# Patient Record
Sex: Female | Born: 1960 | Race: White | Hispanic: No | Marital: Married | Smoking: Current every day smoker
Health system: Southern US, Community
[De-identification: ages and names within clinical notes are randomized; demographics above are authoritative.]

## PROBLEM LIST (undated history)

## (undated) DIAGNOSIS — K219 Gastro-esophageal reflux disease without esophagitis: Secondary | ICD-10-CM

## (undated) DIAGNOSIS — J45909 Unspecified asthma, uncomplicated: Secondary | ICD-10-CM

## (undated) DIAGNOSIS — I10 Essential (primary) hypertension: Secondary | ICD-10-CM

## (undated) DIAGNOSIS — D509 Iron deficiency anemia, unspecified: Secondary | ICD-10-CM

## (undated) DIAGNOSIS — F32A Depression, unspecified: Secondary | ICD-10-CM

## (undated) DIAGNOSIS — F329 Major depressive disorder, single episode, unspecified: Secondary | ICD-10-CM

## (undated) DIAGNOSIS — M1990 Unspecified osteoarthritis, unspecified site: Secondary | ICD-10-CM

## (undated) DIAGNOSIS — F909 Attention-deficit hyperactivity disorder, unspecified type: Secondary | ICD-10-CM

## (undated) DIAGNOSIS — J189 Pneumonia, unspecified organism: Secondary | ICD-10-CM

## (undated) HISTORY — DX: Iron deficiency anemia, unspecified: D50.9

## (undated) HISTORY — PX: NECK SURGERY: SHX720

## (undated) HISTORY — PX: BLADDER SUSPENSION: SHX72

## (undated) HISTORY — PX: BREAST ENHANCEMENT SURGERY: SHX7

---

## 1999-12-17 HISTORY — PX: BACK SURGERY: SHX140

## 2003-12-17 HISTORY — PX: SHOULDER SURGERY: SHX246

## 2004-05-16 ENCOUNTER — Encounter: Payer: Self-pay | Admitting: Family Medicine

## 2004-05-16 LAB — CONVERTED CEMR LAB

## 2005-09-19 ENCOUNTER — Ambulatory Visit: Payer: Self-pay | Admitting: Family Medicine

## 2006-09-23 DIAGNOSIS — N393 Stress incontinence (female) (male): Secondary | ICD-10-CM | POA: Insufficient documentation

## 2006-09-23 DIAGNOSIS — G4700 Insomnia, unspecified: Secondary | ICD-10-CM | POA: Insufficient documentation

## 2006-10-31 ENCOUNTER — Encounter: Payer: Self-pay | Admitting: Family Medicine

## 2008-02-18 ENCOUNTER — Encounter: Payer: Self-pay | Admitting: Family Medicine

## 2008-02-25 ENCOUNTER — Encounter: Payer: Self-pay | Admitting: Family Medicine

## 2008-08-10 ENCOUNTER — Encounter: Payer: Self-pay | Admitting: Family Medicine

## 2009-03-08 DIAGNOSIS — M542 Cervicalgia: Secondary | ICD-10-CM | POA: Insufficient documentation

## 2009-03-12 DIAGNOSIS — E559 Vitamin D deficiency, unspecified: Secondary | ICD-10-CM | POA: Insufficient documentation

## 2010-03-19 DIAGNOSIS — M25649 Stiffness of unspecified hand, not elsewhere classified: Secondary | ICD-10-CM | POA: Insufficient documentation

## 2011-12-17 HISTORY — PX: HAND SURGERY: SHX662

## 2012-08-11 DIAGNOSIS — D649 Anemia, unspecified: Secondary | ICD-10-CM | POA: Insufficient documentation

## 2012-09-10 ENCOUNTER — Ambulatory Visit (INDEPENDENT_AMBULATORY_CARE_PROVIDER_SITE_OTHER): Payer: 59 | Admitting: Family Medicine

## 2012-09-10 ENCOUNTER — Encounter: Payer: Self-pay | Admitting: Family Medicine

## 2012-09-10 VITALS — BP 132/73 | HR 87 | Wt 147.0 lb

## 2012-09-10 DIAGNOSIS — E785 Hyperlipidemia, unspecified: Secondary | ICD-10-CM | POA: Insufficient documentation

## 2012-09-10 DIAGNOSIS — F17200 Nicotine dependence, unspecified, uncomplicated: Secondary | ICD-10-CM

## 2012-09-10 DIAGNOSIS — Z23 Encounter for immunization: Secondary | ICD-10-CM

## 2012-09-10 DIAGNOSIS — F329 Major depressive disorder, single episode, unspecified: Secondary | ICD-10-CM

## 2012-09-10 DIAGNOSIS — F32A Depression, unspecified: Secondary | ICD-10-CM

## 2012-09-10 DIAGNOSIS — Z720 Tobacco use: Secondary | ICD-10-CM

## 2012-09-10 DIAGNOSIS — F909 Attention-deficit hyperactivity disorder, unspecified type: Secondary | ICD-10-CM | POA: Insufficient documentation

## 2012-09-10 MED ORDER — FLUOXETINE HCL 40 MG PO CAPS: 40.0000 mg | ORAL_CAPSULE | Freq: Every day | ORAL | Status: DC

## 2012-09-10 MED ORDER — LOVASTATIN 40 MG PO TABS: 40.0000 mg | ORAL_TABLET | Freq: Every day | ORAL | Status: DC

## 2012-09-10 NOTE — Addendum Note (Signed)
Addended by: Judie Petit A on: 09/10/2012 04:35 PM   Modules accepted: Orders

## 2012-09-10 NOTE — Progress Notes (Signed)
Subjective:    Patient ID: Glenda Fernandez, female    DOB: [birth year], 51 y.o.   MRN: [id number]  HPI Here to estab care. She was previously seen at [facility] in [place] but her physician now is only working part time. SHe fractured her hand on the left as she slipped on wet stairs. Has surgery scheduled next week. It is cast today.   Depression - Has been on fluoxtetine for several years.  Happy with her current regimen. She wants to stay on the 40 mg. She does need refills today.  ADHD- Doing well on her vyvanse. She is out and having a hard time focusing at work.    Hyperlpidemia - Taking her statin and dong well.   She reports her mammogram is up-to-date. Her colonoscopy is overdue. She said she was planning on scheduling it before she fell and fractured her wrist. I encouraged her to go ahead and schedule this out.   Review of Systems  Constitutional: Negative for fever, diaphoresis and unexpected weight change.  HENT: Negative for hearing loss, rhinorrhea and tinnitus.   Eyes: Negative for visual disturbance.  Respiratory: Negative for cough and wheezing.   Cardiovascular: Negative for chest pain and palpitations.  Gastrointestinal: Negative for nausea, vomiting, diarrhea and blood in stool.  Genitourinary: Negative for vaginal bleeding, vaginal discharge and difficulty urinating.  Musculoskeletal: Positive for joint swelling and arthralgias. Negative for myalgias.  Skin: Negative for rash.  Neurological: Negative for headaches.  Hematological: Negative for adenopathy. Does not bruise/bleed easily.  Psychiatric/Behavioral: Positive for dysphoric mood. Negative for disturbed wake/sleep cycle. The patient is not nervous/anxious.     BP 132/73  Pulse 87  Wt 147 lb (66.679 kg)    No Known Allergies  No past medical history on file.  Past Surgical History  Procedure Date  . Breast enhancement surgery 1879  . Shoulder surgery 2005  . Neck surgery   . Back  surgery 2001    History   Social History  . Marital Status: Married    Spouse Name: Molly Maduro    Number of Children: 2  . 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 Smoking status: Current Every Day Smoker -- 1.0 packs/day  . Smokeless tobacco: Not on file  . Alcohol Use: Not on file  . Drug Use: Not on file  . Sexually Active: Not on file   Other Topics Concern  . Not on file   Social History Narrative   Some college.  3 caffeine per day.  Walks 3 times per week for exercise.     Family History  Problem Relation Age of Onset  . Coronary artery disease Father   . Lung cancer Father     mesothelioma  . Alcohol abuse      uncle  . Breast cancer Mother   . Kidney failure Mother   . Bipolar disorder Daughter   . Hyperlipidemia Mother   . Hyperlipidemia Father     Outpatient Encounter Prescriptions as of 09/10/2012  Medication Sig Dispense Refill  . FLUoxetine (PROZAC) 40 MG capsule Take 1 capsule (40 mg total) by mouth daily.  90 capsule  1  . lisdexamfetamine (VYVANSE) 70 MG capsule Take 70 mg by mouth every morning.      . lovastatin (MEVACOR) 40 MG tablet Take 1 tablet (40 mg total) by mouth at bedtime.  90 tablet  1  . DISCONTD: FLUoxetine (PROZAC) 40 MG capsule Take 40 mg by mouth daily.      Marland Kitchen DISCONTD: lovastatin (MEVACOR) 40 MG tablet Take 40 mg by mouth at bedtime.              Objective:   Physical Exam  Constitutional: She is oriented to person, place, and time. She appears well-developed and well-nourished.  HENT:  Head: Normocephalic and atraumatic.  Cardiovascular: Normal rate, regular rhythm and normal heart sounds.   Pulmonary/Chest: Effort normal and breath sounds normal.  Neurological: She is alert and oriented to person, place, and time.  Skin: Skin is warm and dry.  Psychiatric: She has a normal mood and affect. Her behavior is normal.          Assessment & Plan:   Depression - she's doing on her current regimen. We will have incentive her 90 day prescription to the pharmacy.  ADHD - she's doing very well on her Vyvanse. Adjusting to get records from her previous physician to confirm that she has this diagnosis is taking the medication because it is scheduled. Once I get these and then we can write for her medication. She's been out for several weeks. We will give her a coupon card today that she can use once we get the prescription for her.  Hyperlipidemia-I'll refill of her statin.  tob abuse - Discussed smoking cessation. Says know she need to quit.    Seh is not sure if pertussis if up to date.

## 2012-09-10 NOTE — Patient Instructions (Addendum)
Can check with your insurance company to see if they cover the shingles vaccine.  Can check to see if you had a Tdap or Td.

## 2012-09-11 ENCOUNTER — Ambulatory Visit: Payer: Self-pay | Admitting: Family Medicine

## 2012-09-11 ENCOUNTER — Telehealth: Payer: Self-pay | Admitting: Family Medicine

## 2012-09-11 ENCOUNTER — Other Ambulatory Visit: Payer: Self-pay | Admitting: Family Medicine

## 2012-09-11 ENCOUNTER — Encounter: Payer: Self-pay | Admitting: Family Medicine

## 2012-09-11 DIAGNOSIS — D509 Iron deficiency anemia, unspecified: Secondary | ICD-10-CM

## 2012-09-11 DIAGNOSIS — F329 Major depressive disorder, single episode, unspecified: Secondary | ICD-10-CM | POA: Insufficient documentation

## 2012-09-11 DIAGNOSIS — F32A Depression, unspecified: Secondary | ICD-10-CM | POA: Insufficient documentation

## 2012-09-11 HISTORY — DX: Iron deficiency anemia, unspecified: D50.9

## 2012-09-11 MED ORDER — LISDEXAMFETAMINE DIMESYLATE 70 MG PO CAPS: 70.0000 mg | ORAL_CAPSULE | ORAL | Status: DC

## 2012-09-11 NOTE — Telephone Encounter (Signed)
Pt aware.

## 2012-09-11 NOTE — Telephone Encounter (Signed)
Please call pt and let her know got her records and ok to pick up her rx today.

## 2012-09-14 ENCOUNTER — Encounter: Payer: Self-pay | Admitting: Family Medicine

## 2012-09-14 DIAGNOSIS — F1111 Opioid abuse, in remission: Secondary | ICD-10-CM | POA: Insufficient documentation

## 2012-10-12 ENCOUNTER — Other Ambulatory Visit: Payer: Self-pay | Admitting: *Deleted

## 2012-10-12 MED ORDER — LISDEXAMFETAMINE DIMESYLATE 70 MG PO CAPS: 70.0000 mg | ORAL_CAPSULE | ORAL | Status: DC

## 2012-11-06 ENCOUNTER — Encounter: Payer: Self-pay | Admitting: Sports Medicine

## 2012-11-06 ENCOUNTER — Ambulatory Visit (INDEPENDENT_AMBULATORY_CARE_PROVIDER_SITE_OTHER): Payer: 59 | Admitting: Sports Medicine

## 2012-11-06 VITALS — BP 129/76 | HR 103 | Ht 67.0 in | Wt 143.0 lb

## 2012-11-06 DIAGNOSIS — IMO0002 Reserved for concepts with insufficient information to code with codable children: Secondary | ICD-10-CM

## 2012-11-06 DIAGNOSIS — L02412 Cutaneous abscess of left axilla: Secondary | ICD-10-CM | POA: Insufficient documentation

## 2012-11-06 MED ORDER — DOXYCYCLINE HYCLATE 100 MG PO TABS: 100.0000 mg | ORAL_TABLET | Freq: Two times a day (BID) | ORAL | Status: AC

## 2012-11-06 MED ORDER — HYDROCODONE-ACETAMINOPHEN 5-325 MG PO TABS: 1.0000 | ORAL_TABLET | Freq: Four times a day (QID) | ORAL | Status: DC | PRN

## 2012-11-06 NOTE — Assessment & Plan Note (Signed)
Incision and drainage as above. She will move about 1 inch of the packing day after tomorrow, and remove the rest of day after that. Aleve 2 tabs twice a day. Hydrocodone for breakthrough pain. Doxycycline for 7 days. Return to clinic in one week to see how things are doing

## 2012-11-06 NOTE — Progress Notes (Addendum)
Subjective:    CC: Lump in left axilla  HPI: Lump: This is been present and growing for a couple weeks. It is reddish, painful, and warm. She's had an abscess before on her face that was drained, and grew out MRSA. Pain is localized, does not radiate, is moderate.  Past medical history, Surgical history, Family history, Social history, Allergies, and medications have been entered into the medical record, reviewed, and no changes needed.   Review of Systems: No fevers, chills, night sweats, weight loss, chest pain, or shortness of breath.   Objective:    General: Well Developed, well nourished, and in no acute distress.  Neuro: Alert and oriented x3, extra-ocular muscles intact.  HEENT: Normocephalic, atraumatic, pupils equal round reactive to light, neck supple, no masses, no lymphadenopathy, thyroid nonpalpable.  Skin: Warm and dry, no rashes. Cardiac: Regular rate and rhythm, no murmurs rubs or gallops.  Respiratory: Clear to auscultation bilaterally. Not using accessory muscles, speaking in full sentences. There is a 3 x 2 cm abscess in her left axilla. This is fluctuant, warm, and tender to palpation.  Procedure: Complicated incision and drainage of abscess.  Risks, benefits, and alternatives explained and consent obtained. Time out conducted. Surface cleaned with alcohol. 5cc lidocaine with epinephine infiltrated around abscess. Adequate anesthesia ensured. Area prepped and draped in a sterile fashion. #11 blade used to make a stab incision into abscess. Pus expressed with pressure. Curved hemostat used to explore 4 quadrants and loculations broken up. Further purulence expressed. Iodoform packing placed leaving a 1-inch tail. Hemostasis achieved. Pt stable. Aftercare and follow-up advised.  Impression and Recommendations:

## 2012-11-09 ENCOUNTER — Other Ambulatory Visit: Payer: Self-pay | Admitting: *Deleted

## 2012-11-09 MED ORDER — LISDEXAMFETAMINE DIMESYLATE 70 MG PO CAPS: 70.0000 mg | ORAL_CAPSULE | ORAL | Status: DC

## 2012-11-09 NOTE — Telephone Encounter (Signed)
Tried to call pt at work number left and got VM. Tried to call pt on mobile to let her know that she can pickup her Vyvanse rx tomorrow and it had another persons name.

## 2012-11-16 ENCOUNTER — Ambulatory Visit: Payer: 59 | Admitting: Sports Medicine

## 2012-11-16 DIAGNOSIS — Z0289 Encounter for other administrative examinations: Secondary | ICD-10-CM

## 2012-11-20 ENCOUNTER — Other Ambulatory Visit: Payer: Self-pay | Admitting: Orthopedic Surgery

## 2012-11-20 DIAGNOSIS — Z9889 Other specified postprocedural states: Secondary | ICD-10-CM

## 2012-11-20 DIAGNOSIS — Z8781 Personal history of (healed) traumatic fracture: Secondary | ICD-10-CM

## 2012-11-26 ENCOUNTER — Other Ambulatory Visit: Payer: Self-pay | Admitting: Sports Medicine

## 2012-11-26 ENCOUNTER — Ambulatory Visit (INDEPENDENT_AMBULATORY_CARE_PROVIDER_SITE_OTHER): Payer: 59 | Admitting: Sports Medicine

## 2012-11-26 ENCOUNTER — Encounter: Payer: Self-pay | Admitting: Sports Medicine

## 2012-11-26 VITALS — BP 144/78 | HR 75 | Wt 145.0 lb

## 2012-11-26 DIAGNOSIS — L02412 Cutaneous abscess of left axilla: Secondary | ICD-10-CM

## 2012-11-26 DIAGNOSIS — IMO0002 Reserved for concepts with insufficient information to code with codable children: Secondary | ICD-10-CM

## 2012-11-26 MED ORDER — SULFAMETHOXAZOLE-TRIMETHOPRIM 800-160 MG PO TABS: 1.0000 | ORAL_TABLET | Freq: Two times a day (BID) | ORAL | Status: AC

## 2012-11-26 MED ORDER — HYDROCODONE-ACETAMINOPHEN 5-325 MG PO TABS: 1.0000 | ORAL_TABLET | Freq: Four times a day (QID) | ORAL | Status: DC | PRN

## 2012-11-26 NOTE — Assessment & Plan Note (Addendum)
This is new, the other abscess is healed completely. Incision and drainage as above. Culture swab sent. Since doxycycline made her sick, I am going to use Septra double strength for 7 days. Hydrocodone for pain. She will perform daily dressing changes.

## 2012-11-26 NOTE — Progress Notes (Signed)
Subjective:    CC: Lump in left axilla  HPI: Lump: This is present in the left axilla, I did see her several weeks ago for another abscess, we drained that and it healed completely. Unfortunately the doxycycline that we gave her did make her a little bit sick. Now she has another abscess somewhat below, it's painful, severe, the pain is localized, no radiation. She does have a history of MRSA.  Past medical history, Surgical history, Family history, Social history, Allergies, and medications have been entered into the medical record, reviewed, and no changes needed.   Review of Systems: No fevers, chills, night sweats, weight loss, chest pain, or shortness of breath.   Objective:    General: Well Developed, well nourished, and in no acute distress.  Neuro: Alert and oriented x3, extra-ocular muscles intact.  HEENT: Normocephalic, atraumatic, pupils equal round reactive to light, neck supple, no masses, no lymphadenopathy, thyroid nonpalpable.  Skin: Warm and dry, no rashes. Cardiac: Regular rate and rhythm, no murmurs rubs or gallops.  Respiratory: Clear to auscultation bilaterally. Not using accessory muscles, speaking in full sentences. There is a 1 x 2 cm abscess in her left axilla. This is fluctuant, warm, and tender to palpation.  Procedure: Complicated incision and drainage of abscess.  Risks, benefits, and alternatives explained and consent obtained. Time out conducted. Surface cleaned with alcohol. 5cc lidocaine with epinephine infiltrated around abscess. Adequate anesthesia ensured. Area prepped and draped in a sterile fashion. 4 mm punch biopsy used to incise abscess. Pus expressed with pressure. Curved hemostat used to explore 4 quadrants and loculations broken up. Further purulence expressed. Hemostasis achieved. Pt stable. Aftercare and follow-up advised.  Impression and Recommendations:

## 2012-11-30 LAB — WOUND CULTURE
Gram Stain: NONE SEEN
Gram Stain: NONE SEEN

## 2012-12-02 LAB — ANAEROBIC CULTURE
Gram Stain: NONE SEEN
Gram Stain: NONE SEEN

## 2012-12-07 ENCOUNTER — Other Ambulatory Visit: Payer: Self-pay | Admitting: *Deleted

## 2012-12-07 ENCOUNTER — Other Ambulatory Visit: Payer: Self-pay | Admitting: Sports Medicine

## 2012-12-07 MED ORDER — LISDEXAMFETAMINE DIMESYLATE 70 MG PO CAPS: 70.0000 mg | ORAL_CAPSULE | ORAL | Status: DC

## 2012-12-07 NOTE — Telephone Encounter (Signed)
Prescription is in my box 

## 2012-12-07 NOTE — Telephone Encounter (Signed)
Pt.notified

## 2012-12-07 NOTE — Telephone Encounter (Signed)
Pt calls and request a refill on her Vyvanse and wants to pick up today

## 2012-12-15 ENCOUNTER — Ambulatory Visit: Payer: 59 | Admitting: Family Medicine

## 2012-12-29 ENCOUNTER — Ambulatory Visit (INDEPENDENT_AMBULATORY_CARE_PROVIDER_SITE_OTHER): Payer: 59 | Admitting: Family Medicine

## 2012-12-29 ENCOUNTER — Encounter: Payer: Self-pay | Admitting: Family Medicine

## 2012-12-29 VITALS — BP 140/77 | HR 96 | Resp 18 | Wt 149.0 lb

## 2012-12-29 DIAGNOSIS — F32A Depression, unspecified: Secondary | ICD-10-CM

## 2012-12-29 DIAGNOSIS — E785 Hyperlipidemia, unspecified: Secondary | ICD-10-CM

## 2012-12-29 DIAGNOSIS — F329 Major depressive disorder, single episode, unspecified: Secondary | ICD-10-CM

## 2012-12-29 DIAGNOSIS — F3289 Other specified depressive episodes: Secondary | ICD-10-CM

## 2012-12-29 DIAGNOSIS — F909 Attention-deficit hyperactivity disorder, unspecified type: Secondary | ICD-10-CM

## 2012-12-29 MED ORDER — LOVASTATIN 40 MG PO TABS: 40.0000 mg | ORAL_TABLET | Freq: Every day | ORAL | Status: DC

## 2012-12-29 MED ORDER — LISDEXAMFETAMINE DIMESYLATE 70 MG PO CAPS: 70.0000 mg | ORAL_CAPSULE | ORAL | Status: DC

## 2012-12-29 MED ORDER — FLUOXETINE HCL 40 MG PO CAPS: 40.0000 mg | ORAL_CAPSULE | Freq: Every day | ORAL | Status: DC

## 2012-12-29 NOTE — Progress Notes (Signed)
  Subjective:    Patient ID: Glenda Fernandez, female    DOB: [birth year], 52 y.o.   MRN: [id number]  HPI Hyperlipidemia-doing well overall. No chest pain or shortness of breath. No side effects from medication. She ran out of her medication a couple weeks ago. Which is why she is in today. She denies any myalgias on the medication.  Depression-has been-uncontrolled on fluoxetine. Has been out of her medication for couple weeks as well. Has been teaful and has felt really down.  Work has been really stressful and overwhelming.  Both of her kids moved back in with her, and her grandkids. This is been a people in her life as well. One daughter suffers from depression and her second daughter suffers from bipolar disorder. Unfortunately neither one of them has been able to hold down a job and they're both applying for Medicaid currently.  ADHD-doing well on her current regimen. No chest pain or short of breath. It's not causing any insomnia. Her weight is stable.  Review of Systems     Objective:   Physical Exam  Constitutional: She is oriented to person, place, and time. She appears well-developed and well-nourished.  HENT:  Head: Normocephalic and atraumatic.  Cardiovascular: Normal rate, regular rhythm and normal heart sounds.   Pulmonary/Chest: Effort normal and breath sounds normal.  Neurological: She is alert and oriented to person, place, and time.  Skin: Skin is warm and dry.  Psychiatric: She has a normal mood and affect. Her behavior is normal.          Assessment & Plan:  Hyperlipidemia-Due for fasting labwork.  RF sent ot to the pharmacy  Depression-PHQ 9 score of 21. Severe and uncontrolled.Will restart her fluoxetine and then recheck in 1 month.  If she's not well controlled that point then consider increasing her dose to 60 mg or possibly adding a mood stabilizer or maybe Wellbutrin to her regimen. Unfortunately she's going through a lot of personal issues right now. Counseling  would also be helpful as well.  ADHD-well controlled. Continue current regimen. I gave her a prescription today but she will have to wait to have it filled until she's actually due.

## 2013-01-26 ENCOUNTER — Ambulatory Visit: Payer: 59 | Admitting: Family Medicine

## 2013-01-26 DIAGNOSIS — Z0289 Encounter for other administrative examinations: Secondary | ICD-10-CM

## 2013-02-04 ENCOUNTER — Telehealth: Payer: Self-pay | Admitting: *Deleted

## 2013-02-04 NOTE — Telephone Encounter (Signed)
Pt calls and states she miised her appt with you last week. Lost her job and has no insurance. Request a refill on her Vyvanse

## 2013-02-05 MED ORDER — LISDEXAMFETAMINE DIMESYLATE 70 MG PO CAPS: 70.0000 mg | ORAL_CAPSULE | ORAL | Status: DC

## 2013-02-05 NOTE — Telephone Encounter (Signed)
OK to fill

## 2013-03-03 ENCOUNTER — Other Ambulatory Visit: Payer: Self-pay | Admitting: *Deleted

## 2013-03-03 MED ORDER — LISDEXAMFETAMINE DIMESYLATE 70 MG PO CAPS: 70.0000 mg | ORAL_CAPSULE | ORAL | Status: DC

## 2013-03-03 NOTE — Telephone Encounter (Signed)
Will come p/u by end of this week.Glenda Fernandez

## 2013-04-05 ENCOUNTER — Encounter: Payer: Self-pay | Admitting: *Deleted

## 2013-04-05 ENCOUNTER — Other Ambulatory Visit: Payer: Self-pay | Admitting: *Deleted

## 2013-04-05 MED ORDER — LISDEXAMFETAMINE DIMESYLATE 70 MG PO CAPS: 70.0000 mg | ORAL_CAPSULE | ORAL | Status: DC

## 2013-05-04 ENCOUNTER — Telehealth: Payer: Self-pay | Admitting: *Deleted

## 2013-05-04 MED ORDER — LISDEXAMFETAMINE DIMESYLATE 70 MG PO CAPS: 70.0000 mg | ORAL_CAPSULE | ORAL | Status: DC

## 2013-05-04 NOTE — Telephone Encounter (Signed)
Request a refill on Vyvanse

## 2013-05-04 NOTE — Telephone Encounter (Signed)
Rx in my outbox

## 2013-05-04 NOTE — Telephone Encounter (Signed)
Pt notified. Kimberly Gordon, LPN  

## 2013-06-03 ENCOUNTER — Other Ambulatory Visit: Payer: Self-pay | Admitting: *Deleted

## 2013-06-03 MED ORDER — LISDEXAMFETAMINE DIMESYLATE 70 MG PO CAPS: 70.0000 mg | ORAL_CAPSULE | ORAL | Status: DC

## 2013-06-16 ENCOUNTER — Ambulatory Visit (INDEPENDENT_AMBULATORY_CARE_PROVIDER_SITE_OTHER): Payer: 59 | Admitting: Family Medicine

## 2013-06-16 ENCOUNTER — Encounter: Payer: Self-pay | Admitting: Family Medicine

## 2013-06-16 VITALS — BP 126/71 | HR 67 | Ht 67.6 in | Wt 146.0 lb

## 2013-06-16 DIAGNOSIS — F32A Depression, unspecified: Secondary | ICD-10-CM

## 2013-06-16 DIAGNOSIS — F329 Major depressive disorder, single episode, unspecified: Secondary | ICD-10-CM

## 2013-06-16 DIAGNOSIS — F909 Attention-deficit hyperactivity disorder, unspecified type: Secondary | ICD-10-CM

## 2013-06-16 DIAGNOSIS — Z1231 Encounter for screening mammogram for malignant neoplasm of breast: Secondary | ICD-10-CM

## 2013-06-16 DIAGNOSIS — Z0000 Encounter for general adult medical examination without abnormal findings: Secondary | ICD-10-CM

## 2013-06-16 MED ORDER — FLUOXETINE HCL 40 MG PO CAPS: 40.0000 mg | ORAL_CAPSULE | Freq: Every day | ORAL | Status: DC

## 2013-06-16 MED ORDER — LOVASTATIN 40 MG PO TABS: 40.0000 mg | ORAL_TABLET | Freq: Every day | ORAL | Status: DC

## 2013-06-16 MED ORDER — LISDEXAMFETAMINE DIMESYLATE 70 MG PO CAPS: 70.0000 mg | ORAL_CAPSULE | ORAL | Status: DC

## 2013-06-16 MED ORDER — FLUOXETINE HCL (PMDD) 20 MG PO CAPS: ORAL_CAPSULE | ORAL | Status: DC

## 2013-06-16 NOTE — Progress Notes (Signed)
  Subjective:    Patient ID: Glenda Fernandez, female    DOB: 06-16-1961, 52 y.o.   MRN: [id number]  HPI Depression - Says she wants to restart her fluxoetine. She has been off it for some time. She is really stressed. Her kids are still living with her and this is really stressful. No suicidal thoughts.  She's having a lot of difficulty with sleep and feels very poorly about herself. She's having trouble concentrating on things. She still feels down depressed more than half the days. She rates the difficulty of her emotions as very difficult.  ADHD-no chest pain, shortness of breath, palpitations on the medication. She reports that she's not skipping meals. She reports that it is not keeping her awake at night.   Review of Systems     Objective:   Physical Exam  Constitutional: She is oriented to person, place, and time. She appears well-developed and well-nourished.  HENT:  Head: Normocephalic and atraumatic.  Cardiovascular: Normal rate, regular rhythm and normal heart sounds.   Pulmonary/Chest: Effort normal and breath sounds normal.  Neurological: She is alert and oriented to person, place, and time.  Skin: Skin is warm and dry.  Psychiatric: She has a normal mood and affect. Her behavior is normal.          Assessment & Plan:  Depression - Uncontrolled.  Discussed restarting her fluoxetine. I really do think that she needs it. PHQ 9 score of 19 today. Followup in one month to make sure that she's doing well. She also schedule a complete physical at that time.  ADHD-well controlled on current regimen. She is asymptomatic on the medication and is not having any side effects. Doing well followup in 6 months.  Due for screening mammogram.

## 2013-07-14 ENCOUNTER — Encounter: Payer: Self-pay | Admitting: Family Medicine

## 2013-07-14 ENCOUNTER — Ambulatory Visit (INDEPENDENT_AMBULATORY_CARE_PROVIDER_SITE_OTHER): Payer: BC Managed Care – PPO | Admitting: Family Medicine

## 2013-07-14 ENCOUNTER — Other Ambulatory Visit (HOSPITAL_COMMUNITY)
Admission: RE | Admit: 2013-07-14 | Discharge: 2013-07-14 | Disposition: A | Discharge status: Home / Self Care | Payer: BC Managed Care – PPO | Source: Ambulatory Visit | Attending: Family Medicine | Admitting: Family Medicine

## 2013-07-14 VITALS — BP 140/79 | HR 91 | Ht 67.0 in | Wt 140.0 lb

## 2013-07-14 DIAGNOSIS — M25569 Pain in unspecified knee: Secondary | ICD-10-CM

## 2013-07-14 DIAGNOSIS — M25562 Pain in left knee: Secondary | ICD-10-CM

## 2013-07-14 DIAGNOSIS — Z1211 Encounter for screening for malignant neoplasm of colon: Secondary | ICD-10-CM

## 2013-07-14 DIAGNOSIS — Z0000 Encounter for general adult medical examination without abnormal findings: Secondary | ICD-10-CM

## 2013-07-14 DIAGNOSIS — Z1151 Encounter for screening for human papillomavirus (HPV): Secondary | ICD-10-CM | POA: Insufficient documentation

## 2013-07-14 DIAGNOSIS — Z01419 Encounter for gynecological examination (general) (routine) without abnormal findings: Secondary | ICD-10-CM | POA: Insufficient documentation

## 2013-07-14 DIAGNOSIS — N898 Other specified noninflammatory disorders of vagina: Secondary | ICD-10-CM

## 2013-07-14 DIAGNOSIS — Z124 Encounter for screening for malignant neoplasm of cervix: Secondary | ICD-10-CM

## 2013-07-14 LAB — COMPLETE METABOLIC PANEL WITH GFR
ALT: 10 U/L (ref 0–35)
Alkaline Phosphatase: 84 U/L (ref 39–117)
CO2: 28 mEq/L (ref 19–32)
Creat: 0.68 mg/dL (ref 0.50–1.10)
GFR, Est African American: >89 mL/min
GFR, Est Non African American: >89 mL/min
Glucose, Bld: 94 mg/dL (ref 70–99)
Sodium: 141 mEq/L (ref 135–145)
Total Bilirubin: 0.4 mg/dL (ref 0.3–1.2)
Total Protein: 7.3 g/dL (ref 6.0–8.3)

## 2013-07-14 LAB — LIPID PANEL
HDL: 50 mg/dL (ref >39)
Total CHOL/HDL Ratio: 3.6 Ratio
Triglycerides: 85 mg/dL (ref <150)

## 2013-07-14 MED ORDER — ALPRAZOLAM 0.25 MG PO TABS: 0.2500 mg | ORAL_TABLET | Freq: Every day | ORAL | Status: DC | PRN

## 2013-07-14 MED ORDER — LISDEXAMFETAMINE DIMESYLATE 70 MG PO CAPS: 70.0000 mg | ORAL_CAPSULE | ORAL | Status: DC

## 2013-07-14 MED ORDER — FLUOXETINE HCL 40 MG PO CAPS: 40.0000 mg | ORAL_CAPSULE | Freq: Every day | ORAL | Status: DC

## 2013-07-14 NOTE — Progress Notes (Signed)
Subjective:     Glenda Fernandez is a 52 y.o. female and is here for a comprehensive physical exam. The patient reports problems - Pain in left knee.Grandson tried to pick her up and they both fell and she hit her knee on the hardwood florr.  Painful to put pressure on it, not painful to walk. thinks did swelling initially.   History   Social History  . Marital Status: Married    Spouse Name: Molly Maduro    Number of Children: 2  . Years of Education: N/A   Occupational History  . [employer] Tech     [employer] [place]   Social History Main Topics  . Smoking status: Current Every Day Smoker -- 1.00 packs/day  . Smokeless tobacco: Not on file  . Alcohol Use: Not on file  . Drug Use: Not on file  . Sexually Active: Not on file   Other Topics Concern  . Not on file   Social History Narrative   Some college.  3 caffeine per day.  Walks 3 times per week for exercise.    Health Maintenance  Topic Date Due  . Mammogram  05/01/2013  . Colonoscopy  09/11/2013  . Influenza Vaccine  08/16/2013  . Pap Smear  03/12/2015  . Tetanus/tdap  12/16/2018    The following portions of the patient's history were reviewed and updated as appropriate: allergies, current medications, past family history, past medical history, past social history, past surgical history and problem list.  Review of Systems A comprehensive review of systems was negative.   Objective:    BP 140/79  Pulse 91  Ht 5\' 7"  (1.702 m)  Wt 140 lb (63.504 kg)  BMI 21.92 kg/m2 General appearance: alert, cooperative and appears stated age Head: Normocephalic, without obvious abnormality, atraumatic Eyes: conj clear, EOMi, PEERLA Ears: normal TM's and external ear canals both ears Nose: Nares normal. Septum midline. Mucosa normal. No drainage or sinus tenderness. Throat: lips, mucosa, and tongue normal; teeth and gums normal Neck: no adenopathy, no carotid bruit, no JVD, supple, symmetrical, trachea midline and thyroid not enlarged,  symmetric, no tenderness/mass/nodules Back: symmetric, no curvature. ROM normal. No CVA tenderness. Lungs: clear to auscultation bilaterally Breasts: normal appearance, no masses or tenderness Heart: regular rate and rhythm, S1, S2 normal, no murmur, click, rub or gallop Abdomen: soft, non-tender; bowel sounds normal; no masses,  no organomegaly Pelvic: cervix normal in appearance, external genitalia normal, no adnexal masses or tenderness, no cervical motion tenderness, rectovaginal septum normal, uterus normal size, shape, and consistency, vagina normal without discharge and cervic is stenotic.  I did not see any actual lesions in the vagina but I did palpate a approximately nickel-sized rough area anteriorly at the 12:00 position close to the external edge of the vagina. Ross a most like Velcro. Extremities: extremities normal, atraumatic, no cyanosis or edema Pulses: 2+ and symmetric Skin: Skin color, texture, turgor normal. No rashes or lesions Lymph nodes: Cervical, supraclavicular, and axillary nodes normal. Neurologic: Alert and oriented X 3, normal strength and tone. Normal symmetric reflexes. Normal coordination and gait   Left knee with normal range of motion. Nontender along the joint lines. She is tender just to the medial side of the patella. Nontender over the patella itself. She's also little bit tender of the patellar tendon. No increased laxity of the joint. Reflexes 2+ bilaterally. Strength is 5 out of 5.   Assessment:    Healthy female exam.      Plan:  See After Visit Summary for Counseling Recommendations  Says got the call for the mammo and plans to schedule after next week  Will place order for colonoscopy screening.   Keep up a regular exercise program and make sure you are eating a healthy diet Try to eat 4 servings of dairy a day, or if you are lactose intolerant take a calcium with vitamin D daily.  Your vaccines are up to date.   ADHD-did refill  medications today. She's doing well. Blood pressures well controlled. Follow up in 6 months.  Vaginal lesion-I. would like her to see a GYN to investigate this area. She says that when it it was noticed by her previous physician as well. She was never evaluated by a GYN. I'm not sure if this is related to the bladder surgery that she had or may be a lesion that needs to be biopsied.  Left knee pain - gave reassurance. Most likely contusion. It is not continuing to improve the next 2 weeks and consider x-ray since she did have a fall impact onto a hardwood floor on the knee. I think fracture is less likely. Ice as needed and can take anti-inflammatory as needed.

## 2013-07-16 ENCOUNTER — Telehealth: Payer: Self-pay

## 2013-07-16 NOTE — Telephone Encounter (Signed)
Left message for patient to call and schedule appt

## 2013-08-17 ENCOUNTER — Other Ambulatory Visit: Payer: Self-pay | Admitting: *Deleted

## 2013-08-17 MED ORDER — LISDEXAMFETAMINE DIMESYLATE 70 MG PO CAPS: 70.0000 mg | ORAL_CAPSULE | ORAL | Status: DC

## 2013-09-15 ENCOUNTER — Other Ambulatory Visit: Payer: Self-pay | Admitting: *Deleted

## 2013-09-15 MED ORDER — LISDEXAMFETAMINE DIMESYLATE 70 MG PO CAPS: 70.0000 mg | ORAL_CAPSULE | ORAL | Status: DC

## 2014-01-06 ENCOUNTER — Ambulatory Visit (INDEPENDENT_AMBULATORY_CARE_PROVIDER_SITE_OTHER): Payer: BC Managed Care – PPO | Admitting: Family Medicine

## 2014-01-06 ENCOUNTER — Encounter: Payer: Self-pay | Admitting: Family Medicine

## 2014-01-06 VITALS — BP 143/71 | HR 97 | Temp 98.2°F | Ht 67.6 in | Wt 160.0 lb

## 2014-01-06 DIAGNOSIS — Z23 Encounter for immunization: Secondary | ICD-10-CM

## 2014-01-06 DIAGNOSIS — E785 Hyperlipidemia, unspecified: Secondary | ICD-10-CM

## 2014-01-06 DIAGNOSIS — F909 Attention-deficit hyperactivity disorder, unspecified type: Secondary | ICD-10-CM

## 2014-01-06 DIAGNOSIS — F17200 Nicotine dependence, unspecified, uncomplicated: Secondary | ICD-10-CM

## 2014-01-06 DIAGNOSIS — F3289 Other specified depressive episodes: Secondary | ICD-10-CM

## 2014-01-06 DIAGNOSIS — Z1211 Encounter for screening for malignant neoplasm of colon: Secondary | ICD-10-CM

## 2014-01-06 DIAGNOSIS — F329 Major depressive disorder, single episode, unspecified: Secondary | ICD-10-CM

## 2014-01-06 DIAGNOSIS — Z720 Tobacco use: Secondary | ICD-10-CM

## 2014-01-06 MED ORDER — LOVASTATIN 40 MG PO TABS: 40.0000 mg | ORAL_TABLET | Freq: Every day | ORAL | Status: DC

## 2014-01-06 MED ORDER — LISDEXAMFETAMINE DIMESYLATE 70 MG PO CAPS: 70.0000 mg | ORAL_CAPSULE | ORAL | Status: DC

## 2014-01-06 MED ORDER — FLUOXETINE HCL 20 MG PO CAPS: 60.0000 mg | ORAL_CAPSULE | Freq: Every day | ORAL | Status: DC

## 2014-01-06 NOTE — Progress Notes (Signed)
   Subjective:    Patient ID: Glenda Fernandez, female    DOB: 07/09/1961, 53 y.o.   MRN: [id number]  HPI F/U depression - On prozac 40mg .  She was incarcerated for 5 months after I last sawy her.  She has been more anxious and has thought about [facility] the mood [facility].   It has actually been somewhat stressful to be back home. She's felt down and depressed more than half the days. She's been over eating in fact has gained 20 pounds in the last 6 months. She feels bad about herself has difficulty concentrating. She has not had any suicidal thoughts.  Hyperlipidemia-on lovastatin 40 mg. No S.E or myalgias.   Lab Results  Component Value Date   CHOL 182 07/14/2013   HDL 50 07/14/2013   LDLCALC 115* 07/14/2013   TRIG 85 07/14/2013   CHOLHDL 3.6 07/14/2013   ADHD - She would like ot restart her Vyvanse.  Has been off x 6 months. Denies any chest pain, shortness of breath or palpitations and she takes the medication.  Review of Systems     Objective:   Physical Exam  Constitutional: She is oriented to person, place, and time. She appears well-developed and well-nourished.  HENT:  Head: Normocephalic and atraumatic.  Cardiovascular: Normal rate, regular rhythm and normal heart sounds.   Pulmonary/Chest: Effort normal and breath sounds normal.  Neurological: She is alert and oriented to person, place, and time.  Skin: Skin is warm and dry.  Psychiatric: She has a normal mood and affect. Her behavior is normal.          Assessment & Plan:  Depression - PHQ-9 score 13.  disucssed optoins. Will increase her prozac to 60mg  daily.  Avoid benzo's with her substance abuse history.  F/U in 2-3 months.   Hyperlipidemia- Doing well. Repeat lipids in 6 months.    Given flu shot today.    Had referred her in July for screening colonoscopy. Patient never went. Also referred for screening mammogram. Patient never went. Last mammogram was in May of 2012. Will place new rferrral for colonscopy.  Can  schedule mammogram.    Tobacco abuse- Encouraged cessation. She actually quit smoking for 5 months ago she was incarcerated but started smoking again when she got back home. Encourage her work on weaning down and discontinuing smoking.

## 2014-01-13 ENCOUNTER — Ambulatory Visit: Payer: BC Managed Care – PPO

## 2014-02-02 ENCOUNTER — Other Ambulatory Visit: Payer: Self-pay | Admitting: *Deleted

## 2014-02-02 MED ORDER — LISDEXAMFETAMINE DIMESYLATE 70 MG PO CAPS: 70.0000 mg | ORAL_CAPSULE | ORAL | Status: DC

## 2014-02-15 ENCOUNTER — Ambulatory Visit (INDEPENDENT_AMBULATORY_CARE_PROVIDER_SITE_OTHER): Payer: BC Managed Care – PPO | Admitting: Family Medicine

## 2014-02-15 ENCOUNTER — Encounter: Payer: Self-pay | Admitting: Family Medicine

## 2014-02-15 VITALS — BP 175/86 | HR 77 | Temp 98.2°F | Wt 156.0 lb

## 2014-02-15 DIAGNOSIS — L0390 Cellulitis, unspecified: Secondary | ICD-10-CM

## 2014-02-15 DIAGNOSIS — L0291 Cutaneous abscess, unspecified: Secondary | ICD-10-CM

## 2014-02-15 MED ORDER — SULFAMETHOXAZOLE-TRIMETHOPRIM 800-160 MG PO TABS: ORAL_TABLET | ORAL | Status: AC

## 2014-02-15 MED ORDER — OXYCODONE-ACETAMINOPHEN 5-325 MG PO TABS: 1.0000 | ORAL_TABLET | Freq: Three times a day (TID) | ORAL | Status: DC | PRN

## 2014-02-15 NOTE — Progress Notes (Signed)
CC: Glenda Fernandez is a 53 y.o. female is here for left hand infection   Subjective: HPI:  Complains of left hand swelling and redness that began on Saturday night has been worsening a daily basis. Localized to the palmar aspect of the base of the middle finger originally however now mild involvement of the dorsal aspect. No interventions other than ibuprofen and Tylenol which has not been helping with pain which is described as moderate in severity. Denies any known puncture wound however does have a break the skin on the palm at the base of the middle finger.  She denies fevers, chills, nausea, flushing, rapid heartbeat, nor radiation of pain. 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 She denies any history of failure of oral antibiotics with the above conditions.   Review Of Systems Outlined In HPI  No past medical history on file.  Past Surgical History  Procedure Laterality Date  . Breast enhancement surgery Left 1879  . Shoulder surgery  2005  . Neck surgery    . Back surgery  2001  . Bladder suspension    . Hand surgery Left 2013    for fracture, [facility]   Family History  Problem Relation Age of Onset  . Coronary artery disease Father   . Lung cancer Father     mesothelioma  . Alcohol abuse      uncle  . Breast cancer Mother   . Kidney failure Mother   . Bipolar disorder Daughter   . Hyperlipidemia Mother   . Hyperlipidemia Father   . Depression Daughter     History   Social History  . Marital Status: Married    Spouse Name: Molly Maduro    Number of Children: 2  . Years of Education: N/A   Occupational History  . 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 Smoking status: Current Every Day Smoker -- 1.00 packs/day  . Smokeless tobacco: Not on file  . Alcohol Use: Not on file  . Drug Use: Not on file  . Sexual Activity: Not on file   Other Topics Concern  . Not on file   Social  History Narrative   Some college.  3 caffeine per day.  Walks 3 times per week for exercise.      Objective: BP 175/86  Pulse 77  Temp(Src) 98.2 F (36.8 C) (Oral)  Wt 156 lb (70.761 kg)  General: Alert and Oriented, No Acute Distress HEENT: Pupils equal, round, reactive to light. Conjunctivae clear.  Moist mucous membranes Lungs: Clear to auscultation bilaterally, no wheezing/ronchi/rales.  Comfortable work of breathing. Good air movement. Cardiac: Regular rate and rhythm. Normal S1/S2.  No murmurs, rubs, nor gallops.   Extremities: Neurovascularly intact throughout the entire left digits. At the base of the middle finger and ring finger left palm there is moderate erythema and swelling without induration or fluctuance, dorsal aspect of the same region there is very mild swelling and redness. Passive movement of all 5 digits does not reproduce any pain whatsoever. Mental Status: No depression, anxiety, nor agitation. Skin: Warm and dry.  Assessment & Plan: Nimra was seen today for left hand infection.  Diagnoses and associated orders for this visit:  Cellulitis - sulfamethoxazole-trimethoprim (SEPTRA DS) 800-160 MG per tablet; Two by mouth twice a day for ten days. - oxyCODONE-acetaminophen (ROXICET) 5-325 MG per tablet; Take 1-2 tablets by  mouth every 8 (eight) hours as needed for severe pain.    Cellulitis: Start Septra, Percocet only as needed for pain focused primarily on nonsteroidal anti-inflammatories.  The periphery of the erythema and redness was outlined with a surgical pen on the left hand she was instructed that this expands after the next 48 hours to call us for referral to a hand surgeon.  Return if symptoms worsen or fail to improve.

## 2014-02-17 ENCOUNTER — Telehealth: Payer: Self-pay

## 2014-02-17 DIAGNOSIS — M659 Synovitis and tenosynovitis, unspecified: Secondary | ICD-10-CM | POA: Insufficient documentation

## 2014-02-17 NOTE — Telephone Encounter (Signed)
Pt notified and voiced understanding 

## 2014-02-17 NOTE — Telephone Encounter (Signed)
Glenda FailWendy states her hand is not any better. She still have swelling and pain in her right hand. Please advise.

## 2014-02-17 NOTE — Telephone Encounter (Signed)
I know that I told her I would try to get her a referral to a hand surgeon however I tried this for a patient earlier this morning and had to send her to the ED for further eval since no local hand surgeons could see her in office.  With that being the case I'd strongly recommend Mrs. Glenda Fernandez to go to an ED of her choice for further eval, it's very likely that she's going to need IV antibiotics at this point.

## 2014-03-01 ENCOUNTER — Other Ambulatory Visit: Payer: Self-pay | Admitting: *Deleted

## 2014-03-01 MED ORDER — LISDEXAMFETAMINE DIMESYLATE 70 MG PO CAPS: 70.0000 mg | ORAL_CAPSULE | ORAL | Status: DC

## 2014-03-30 ENCOUNTER — Other Ambulatory Visit: Payer: Self-pay | Admitting: *Deleted

## 2014-03-30 MED ORDER — LISDEXAMFETAMINE DIMESYLATE 70 MG PO CAPS: 70.0000 mg | ORAL_CAPSULE | ORAL | Status: DC

## 2014-04-07 ENCOUNTER — Ambulatory Visit: Payer: BC Managed Care – PPO | Admitting: Family Medicine

## 2014-04-07 DIAGNOSIS — Z0289 Encounter for other administrative examinations: Secondary | ICD-10-CM

## 2014-04-27 ENCOUNTER — Other Ambulatory Visit: Payer: Self-pay | Admitting: *Deleted

## 2014-04-27 MED ORDER — LISDEXAMFETAMINE DIMESYLATE 70 MG PO CAPS: 70.0000 mg | ORAL_CAPSULE | ORAL | Status: DC

## 2014-04-27 NOTE — Telephone Encounter (Signed)
Pt called and reminded that she was to f/u. appt made for 5/19 for mood and adhd.Deno Etienneonya L Maevyn Riordan

## 2014-05-03 ENCOUNTER — Encounter: Payer: Self-pay | Admitting: Family Medicine

## 2014-05-03 ENCOUNTER — Ambulatory Visit (INDEPENDENT_AMBULATORY_CARE_PROVIDER_SITE_OTHER): Payer: BC Managed Care – PPO | Admitting: Family Medicine

## 2014-05-03 ENCOUNTER — Ambulatory Visit: Payer: BC Managed Care – PPO

## 2014-05-03 VITALS — BP 142/73 | HR 84 | Ht 67.6 in | Wt 148.0 lb

## 2014-05-03 DIAGNOSIS — F909 Attention-deficit hyperactivity disorder, unspecified type: Secondary | ICD-10-CM

## 2014-05-03 DIAGNOSIS — F3289 Other specified depressive episodes: Secondary | ICD-10-CM

## 2014-05-03 DIAGNOSIS — F32A Depression, unspecified: Secondary | ICD-10-CM

## 2014-05-03 DIAGNOSIS — F329 Major depressive disorder, single episode, unspecified: Secondary | ICD-10-CM

## 2014-05-03 DIAGNOSIS — Z1231 Encounter for screening mammogram for malignant neoplasm of breast: Secondary | ICD-10-CM

## 2014-05-03 NOTE — Addendum Note (Signed)
Addended by: Nani GasserMETHENEY, Kamal Jurgens D on: 05/03/2014 04:04 PM   Modules accepted: Level of Service

## 2014-05-03 NOTE — Progress Notes (Addendum)
   Subjective:    Patient ID: Glenda Fernandez, female    DOB: 01/12/1961, 53 y.o.   MRN: [id number]  HPI Depression - She is doing some better on the fluoxteine 60mg .  No increase in side effects.  She knows she needs counseling for PTSD.  Says has been spending a lot of time helping take care of her mother in law. She shares one car with multiple family members in this makes it very difficult for her to get here. Her daughter fortunately is now getting [facility] which she is happy about.  She complains of little extra pleasure in doing things more than half the days. She still complains of feeling tired and having low energy as well as feeling bad about herself and having difficulty concentrating. She actually think she might try getting a part-time job and she thinks this would be helpful for her mental health.   ADHD-tolerating medication well without any side effects upon. No chest pain or palpitations. Due for refill. Review of Systems     Objective:   Physical Exam  Constitutional: She is oriented to person, place, and time. She appears well-developed and well-nourished.  HENT:  Head: Normocephalic and atraumatic.  Cardiovascular: Normal rate, regular rhythm and normal heart sounds.   Pulmonary/Chest: Effort normal and breath sounds normal.  Neurological: She is alert and oriented to person, place, and time.  Skin: Skin is warm and dry.  Psychiatric: She has a normal mood and affect. Her behavior is normal.          Assessment & Plan:  Depression - under fair control. She feel slike part of it is just that she has a lot on her plate and she wants to consider counseling. She wants to stay on current regimen. We can certainly think about switching medications to another medication to Control her symptoms but she is currently wanting to stay on the fluoxetine 60 mg. Call if any problems. Otherwise followup in 3 months.  ADHD-well controlled and  asymptomatic. Her pressure is up a little bit today we'll need to monitor this carefully. Followup in 3 months.

## 2014-05-27 ENCOUNTER — Other Ambulatory Visit: Payer: Self-pay | Admitting: *Deleted

## 2014-05-27 MED ORDER — LISDEXAMFETAMINE DIMESYLATE 70 MG PO CAPS: 70.0000 mg | ORAL_CAPSULE | ORAL | Status: DC

## 2014-05-30 ENCOUNTER — Other Ambulatory Visit: Payer: Self-pay | Admitting: *Deleted

## 2014-05-30 MED ORDER — LISDEXAMFETAMINE DIMESYLATE 70 MG PO CAPS: 70.0000 mg | ORAL_CAPSULE | ORAL | Status: DC

## 2014-06-22 ENCOUNTER — Encounter: Payer: Self-pay | Admitting: Physician Assistant

## 2014-06-22 ENCOUNTER — Ambulatory Visit (INDEPENDENT_AMBULATORY_CARE_PROVIDER_SITE_OTHER): Payer: BC Managed Care – PPO | Admitting: Physician Assistant

## 2014-06-22 ENCOUNTER — Ambulatory Visit (INDEPENDENT_AMBULATORY_CARE_PROVIDER_SITE_OTHER): Payer: BC Managed Care – PPO

## 2014-06-22 VITALS — BP 138/60 | HR 73 | Ht 67.6 in | Wt 148.0 lb

## 2014-06-22 DIAGNOSIS — M25561 Pain in right knee: Secondary | ICD-10-CM

## 2014-06-22 DIAGNOSIS — S82009A Unspecified fracture of unspecified patella, initial encounter for closed fracture: Secondary | ICD-10-CM

## 2014-06-22 DIAGNOSIS — M25469 Effusion, unspecified knee: Secondary | ICD-10-CM

## 2014-06-22 DIAGNOSIS — S8991XA Unspecified injury of right lower leg, initial encounter: Secondary | ICD-10-CM

## 2014-06-22 DIAGNOSIS — S82001A Unspecified fracture of right patella, initial encounter for closed fracture: Secondary | ICD-10-CM

## 2014-06-22 DIAGNOSIS — M25569 Pain in unspecified knee: Secondary | ICD-10-CM

## 2014-06-22 DIAGNOSIS — S8990XA Unspecified injury of unspecified lower leg, initial encounter: Secondary | ICD-10-CM

## 2014-06-22 DIAGNOSIS — S99919A Unspecified injury of unspecified ankle, initial encounter: Secondary | ICD-10-CM

## 2014-06-22 DIAGNOSIS — S99929A Unspecified injury of unspecified foot, initial encounter: Secondary | ICD-10-CM

## 2014-06-22 MED ORDER — HYDROCODONE-ACETAMINOPHEN 5-325 MG PO TABS: 1.0000 | ORAL_TABLET | Freq: Three times a day (TID) | ORAL | Status: DC | PRN

## 2014-06-22 NOTE — Progress Notes (Signed)
   Subjective:    Patient ID: Yves DillWendy P Ryce, female    DOB: 11/17/1961, 53 y.o.   MRN: [id number]  HPI Pt is a 53 yo female who presents to the clinic after falling directly on right knee yesterday. She was carrying folded towels when she tripped over animal gate that she forgot about going into her bedroom. She is in 7/10 pain that she describes as constant and throbbing. Pain is made worse with extension and flexion of right leg. She is able to walk but is painful to bear weight. Ibuprofen helps some. She has wrapped in ace bandage which helps some. She could not sleep last night because pain was so bad.    Review of Systems  All other systems reviewed and are negative.      Objective:   Physical Exam  Constitutional: She appears well-developed and well-nourished.  Musculoskeletal:  Pain with palpation over patella.  Significant swelling over lateral superior and medial knee. Decreased active ROM. Pt not able to full extend or flex without help.  Negative for any knee laxity.  Some medial joint tenderness to palpation.           Assessment & Plan:  Patella fracture-   Xray:  FINDINGS:  Mild suprapatellar joint effusion is noted. Probable nondisplaced  fracture is seen involving the medial aspect of the patella. Joint  spaces are otherwise intact.  IMPRESSION:  Probable nondisplaced patellar fracture with associated  suprapatellar joint effusion.  Consulted Dr. Megan Salonhekkekendam. Placed in knee immobilization splint. Norco given for pain controlled. HO given describing icing. Keep elevated. Follow up in 2 weeks with Dr. Karie Schwalbe.    Important to note: I inadvertently did not see Narcotic abuse in remission. It was my first time seeing patient for acute need. She has not appeared to be drug seeking over her hx at this office. She was given limited supply of Norco. Would be important to access "need" for pain control at 2 week mark.

## 2014-06-22 NOTE — Patient Instructions (Signed)
Patellar Fracture, Adult °A patellar fracture is a break in your kneecap (patella).  °CAUSES  °· A direct blow to the knee or a fall is usually the cause of a broken patella. °· A very hard and strong bending of your knee can cause a patellar fracture. °RISK FACTORS °Involvement in contact sports, especially sports that involve a lot of jumping. °SIGNS AND SYMPTOMS  °· Tender and swollen knee. °· Pain when you move your knee, especially when you try to straighten out your leg. °· Difficulty walking or putting weight on your knee. °· Misshapen knee (as if a bone is out of place). °DIAGNOSIS  °Patellar fracture is usually diagnosed with a physical exam and an X-ray exam. °TREATMENT  °Treatment depends on the type of fracture: °· If your patella is still in the right position after the fracture and you can still straighten your leg out, you can usually be treated with a splint or cast for 4-6 weeks. °· If your patella is broken into multiple small pieces but you are able to straighten your leg, you can usually be treated with a splint or cast for 4-6 weeks. Sometimes your patella may need to be removed before the cast is applied. °· If you cannot straighten out your leg after a patellar fracture, then surgery is required to hold the bony fragments together until they heal. A cast or splint will be applied for 4-6 weeks. °HOME CARE INSTRUCTIONS  °· Only take over-the-counter or prescription medicines for pain, discomfort, or fever as directed by your health care provider. °· Use crutches as directed, and exercise the leg as directed. °· Apply ice to the injured area: °¨ Put ice in a plastic bag. °¨ Place a towel between your skin and the bag. °¨ Leave the ice on for 20 minutes, 2-3 times a day. °· Elevate the affected knee above the level of your heart. °SEEK MEDICAL CARE IF: °· You suspect you have significantly injured your knee. °· You hear a pop after a knee injury. °· Your knee is misshapen after a knee  injury. °· You have pain when you move your knee. °· You have difficulty walking or putting weight on your knee. °· You cannot fully move your knee. °SEEK IMMEDIATE MEDICAL CARE IF: °· You have redness, swelling, or increasing pain in your knee. °· You have a fever. °Document Released: 08/31/2003 Document Revised: 09/22/2013 Document Reviewed: 07/14/2013 °ExitCare® Patient Information ©2015 [employer]. This information is not intended to replace advice given to you by your health care provider. Make sure you discuss any questions you have with your health care provider. ° °

## 2014-07-11 ENCOUNTER — Ambulatory Visit (INDEPENDENT_AMBULATORY_CARE_PROVIDER_SITE_OTHER): Payer: BC Managed Care – PPO | Admitting: Sports Medicine

## 2014-07-11 ENCOUNTER — Encounter: Payer: Self-pay | Admitting: Sports Medicine

## 2014-07-11 VITALS — BP 122/70 | HR 79 | Ht 67.0 in | Wt 147.0 lb

## 2014-07-11 DIAGNOSIS — S82001D Unspecified fracture of right patella, subsequent encounter for closed fracture with routine healing: Secondary | ICD-10-CM

## 2014-07-11 DIAGNOSIS — S82009A Unspecified fracture of unspecified patella, initial encounter for closed fracture: Secondary | ICD-10-CM

## 2014-07-11 MED ORDER — HYDROCODONE-ACETAMINOPHEN 5-325 MG PO TABS: 1.0000 | ORAL_TABLET | Freq: Three times a day (TID) | ORAL | Status: DC | PRN

## 2014-07-11 NOTE — Progress Notes (Signed)
   Subjective:    I'm seeing this patient as a consultation for: Dr. Linford ArnoldMetheney    CC: Knee pain  HPI: Patient is a 53 year old woman with traumatic knee injury who is coming in for a consultation and three week follow-up. Three weeks ago, she fell on her right knee, and experienced pain, she wrapped it with a compressive dressing and continued to work around her house. She says that she noticed a decrease in mobility and some very minor swelling. There was no bruising and the pain at the time was not significant. The next day, the pain and swelling had increased, and the range of motion was even more restricted. She was evaluated by her doctor and found to have a patellar fracture; she was immobilized and given vicodin for pain. Today her pain and range of motion are significantly better, she says that it is still painful with pressure, weight bearing, and flexion. She states that her swelling has also gone down, though she notes that there is some swelling in her right lower leg.    Of note, patient has smoked 1 ppd for the past 40 years and experienced menopause in her early 2440's. She takes no calcium or vitamin D. She states that she is "clumsy" and falls frequently.   Past medical history, Surgical history, Family history not pertinant except as noted below, Social history, Allergies, and medications have been entered into the medical record, reviewed, and no changes needed.    Objective:   General: Well Developed, well nourished, and in no acute distress.  Neuro/Psych: Alert and oriented x3, extra-ocular muscles intact, able to move all 4 extremities, sensation grossly intact. Skin: Warm and dry, no rashes noted.  Respiratory: Not using accessory muscles, speaking in full sentences, trachea midline.  Cardiovascular: Pulses palpable, no extremity edema. Abdomen: Does not appear distended. Right Knee: No erythema or obvious bony abnormalities. Slight swelling No warmth, joint line  tenderness, or condyle tenderness.  patellar tenderness along the medial aspect Excellent strength to the extensor apparatus. ROM restricted in flexion Ligaments with solid consistent endpoints including ACL, PCL, LCL, MCL.  Xray Right Knee (from original presentation): Small, non-displaced fracture on the medial aspect of the patella of the right knee.   Impression and Recommendations:    She has made excellent progress so far, and has demonstrated rapid improvement. Patient was given a knew prescription for vicodin to help manage pain, and given a hinged brace to allow her better use of the limb. Due to her frequent falls, we will be scheduling her with physical therapy to work on proprioception and balance.  Additionally, her frequent falls, smoking, and early menopause increase her risk of a serious fall, so we are referring her for a DEXA scan.   Finally, patient noted that she had self-discontinued her statin, she was asked to start taking it again and talk to her doctor before discontinuing.

## 2014-07-11 NOTE — Assessment & Plan Note (Signed)
Extremely mild nondisplaced medial patellar fracture. Good function of the extensor mechanism. Refilling hydrocodone for pain, transition into a Velcro knee brace. Physical therapy, bone density test. Return in 3 weeks, I will likely turn her loose afterwards.  I billed a fracture code for this encounter, all subsequent visits will be post-op checks in the global period.

## 2014-07-25 ENCOUNTER — Other Ambulatory Visit: Payer: Self-pay | Admitting: *Deleted

## 2014-07-25 ENCOUNTER — Telehealth: Payer: Self-pay | Admitting: *Deleted

## 2014-07-25 MED ORDER — LISDEXAMFETAMINE DIMESYLATE 70 MG PO CAPS: 70.0000 mg | ORAL_CAPSULE | ORAL | Status: DC

## 2014-07-25 NOTE — Telephone Encounter (Signed)
Patient notified that script was at front desk ready for pick up. Corliss SkainsJamie Nikko Goldwire, CMA

## 2014-08-01 ENCOUNTER — Ambulatory Visit: Payer: BC Managed Care – PPO | Admitting: Sports Medicine

## 2014-08-03 ENCOUNTER — Ambulatory Visit: Payer: BC Managed Care – PPO | Admitting: Family Medicine

## 2014-08-24 ENCOUNTER — Ambulatory Visit (INDEPENDENT_AMBULATORY_CARE_PROVIDER_SITE_OTHER): Payer: BC Managed Care – PPO | Admitting: Family Medicine

## 2014-08-24 ENCOUNTER — Encounter: Payer: Self-pay | Admitting: Family Medicine

## 2014-08-24 VITALS — BP 136/68 | HR 83 | Wt 149.0 lb

## 2014-08-24 DIAGNOSIS — F329 Major depressive disorder, single episode, unspecified: Secondary | ICD-10-CM

## 2014-08-24 DIAGNOSIS — F902 Attention-deficit hyperactivity disorder, combined type: Secondary | ICD-10-CM

## 2014-08-24 DIAGNOSIS — E785 Hyperlipidemia, unspecified: Secondary | ICD-10-CM

## 2014-08-24 DIAGNOSIS — F3289 Other specified depressive episodes: Secondary | ICD-10-CM | POA: Diagnosis not present

## 2014-08-24 DIAGNOSIS — Z23 Encounter for immunization: Secondary | ICD-10-CM | POA: Diagnosis not present

## 2014-08-24 DIAGNOSIS — F909 Attention-deficit hyperactivity disorder, unspecified type: Secondary | ICD-10-CM

## 2014-08-24 DIAGNOSIS — F32A Depression, unspecified: Secondary | ICD-10-CM

## 2014-08-24 MED ORDER — LISDEXAMFETAMINE DIMESYLATE 70 MG PO CAPS: 70.0000 mg | ORAL_CAPSULE | ORAL | Status: DC

## 2014-08-24 MED ORDER — LOVASTATIN 40 MG PO TABS: 40.0000 mg | ORAL_TABLET | Freq: Every day | ORAL | Status: DC

## 2014-08-24 NOTE — Assessment & Plan Note (Signed)
Well controlled on Diovan 70 mg. She's tolerating it well without any side effects or problems. Her blood pressures back down to normal today. We will go ahead and refill the medication for the next 90 days. She will followup in 3 months.

## 2014-08-24 NOTE — Addendum Note (Signed)
Addended by: Chalmers Cater on: 08/24/2014 12:10 PM   Modules accepted: Orders

## 2014-08-24 NOTE — Progress Notes (Signed)
   Subjective:    Patient ID: Glenda Fernandez, female    DOB: 05/25/1961, 53 y.o.   MRN: [id number]  HPI Depression - Taking fluoxetine  daily. She is happy wit her current regimen.  She wants to get her community service done and is hoping that will lower her stress levels.  Has had some financial stressors recently.  She is very involved in her grandkids life.     ADHD - No CP, palpitatios or insomnia.  Happy with her current dose and regimen. She feels like it's working well to control her symptoms. She is due for refill today.  Hyperlipidemia   - tolerating her statin well without any side effects or myalgias.   Review of Systems     Objective:   Physical Exam  Constitutional: She is oriented to person, place, and time. She appears well-developed and well-nourished.  HENT:  Head: Normocephalic and atraumatic.  Cardiovascular: Normal rate, regular rhythm and normal heart sounds.   Pulmonary/Chest: Effort normal and breath sounds normal.  Neurological: She is alert and oriented to person, place, and time.  Skin: Skin is warm and dry.  Psychiatric: She has a normal mood and affect. Her behavior is normal.          Assessment & Plan:

## 2014-08-24 NOTE — Assessment & Plan Note (Signed)
Not well controlled today. PHQ 9 score of 10. I encouraged her to think about increasing her medication but she wants to stay with 60 mg for now. Will followup in 3 months and address again. Refill sent to pharmacy.

## 2014-08-24 NOTE — Assessment & Plan Note (Signed)
Tolerating statin well. Due for CMP and lipid panel. Last check was in July of 2014. Lab slip provided today. She will call her convenience.

## 2014-09-26 ENCOUNTER — Other Ambulatory Visit: Payer: Self-pay | Admitting: Family Medicine

## 2014-11-23 ENCOUNTER — Ambulatory Visit (INDEPENDENT_AMBULATORY_CARE_PROVIDER_SITE_OTHER): Payer: BC Managed Care – PPO | Admitting: Family Medicine

## 2014-11-23 ENCOUNTER — Encounter: Payer: Self-pay | Admitting: Family Medicine

## 2014-11-23 VITALS — BP 135/76 | HR 79 | Temp 98.4°F | Wt 147.0 lb

## 2014-11-23 DIAGNOSIS — F329 Major depressive disorder, single episode, unspecified: Secondary | ICD-10-CM | POA: Diagnosis not present

## 2014-11-23 DIAGNOSIS — F902 Attention-deficit hyperactivity disorder, combined type: Secondary | ICD-10-CM

## 2014-11-23 DIAGNOSIS — F32A Depression, unspecified: Secondary | ICD-10-CM

## 2014-11-23 MED ORDER — LISDEXAMFETAMINE DIMESYLATE 70 MG PO CAPS: 70.0000 mg | ORAL_CAPSULE | ORAL | Status: DC

## 2014-11-23 MED ORDER — FLUOXETINE HCL 40 MG PO CAPS: 80.0000 mg | ORAL_CAPSULE | Freq: Every day | ORAL | Status: DC

## 2014-11-23 NOTE — Progress Notes (Signed)
   Subjective:    Patient ID: Glenda Fernandez, female    DOB: 11/10/1961, 53 y.o.   MRN: [id number]  HPI ADHD - doing well on her Vyvanse. No chest pain or short of breath. No palpitations. Not keeping her awake at night. She's happy with her current regimen would like a refill today.  Depression- currently on fluoxetine 60 mg daily. She does complain of feeling down and depressed several days of the week as well as little interest or pleasure in doing things several days of the week. She also complains of difficulty concentrating and some fatigue. She has had thoughts of feeling better off dead but denies wanting to harm herself. She rates her symptoms is somewhat difficult.  Review of Systems     Objective:   Physical Exam  Constitutional: She is oriented to person, place, and time. She appears well-developed and well-nourished.  HENT:  Head: Normocephalic and atraumatic.  Cardiovascular: Normal rate, regular rhythm and normal heart sounds.   Pulmonary/Chest: Effort normal and breath sounds normal.  Neurological: She is alert and oriented to person, place, and time.  Skin: Skin is warm and dry.  Psychiatric: She has a normal mood and affect. Her behavior is normal.          Assessment & Plan:  Depression-PHQ 9 score of 9 today. Previous was 10. Discussed options. She's currently taking fluoxetine 60 mg. We'll go ahead and increase to 80 mg. Follow-up in 3 months or call if any side effects or problems.  ADHD-happy with current regimen for Vyvanse.  Will fill prescriptions for the next 90 days. Hand written prescriptions provided. Next  Hyperlipidemia-due to repeat lipid panel. She says she went to the lab today and had her blood work done.

## 2014-11-24 LAB — COMPLETE METABOLIC PANEL WITH GFR
ALBUMIN: 4.4 g/dL (ref 3.5–5.2)
ALT: 14 U/L (ref 0–35)
AST: 18 U/L (ref 0–37)
Alkaline Phosphatase: 77 U/L (ref 39–117)
BUN: 10 mg/dL (ref 6–23)
CALCIUM: 9.3 mg/dL (ref 8.4–10.5)
CHLORIDE: 105 meq/L (ref 96–112)
CO2: 24 mEq/L (ref 19–32)
Creat: 0.62 mg/dL (ref 0.50–1.10)
GFR, Est African American: >89 mL/min
GFR, Est Non African American: >89 mL/min
Glucose, Bld: 79 mg/dL (ref 70–99)
POTASSIUM: 4 meq/L (ref 3.5–5.3)
SODIUM: 141 meq/L (ref 135–145)
TOTAL PROTEIN: 7.1 g/dL (ref 6.0–8.3)
Total Bilirubin: 0.3 mg/dL (ref 0.2–1.2)

## 2014-11-24 LAB — LIPID PANEL
Cholesterol: 259 mg/dL — ABNORMAL HIGH (ref 0–200)
HDL: 49 mg/dL (ref >39)
LDL Cholesterol: 181 mg/dL — ABNORMAL HIGH (ref 0–99)
TRIGLYCERIDES: 147 mg/dL (ref <150)
Total CHOL/HDL Ratio: 5.3 Ratio
VLDL: 29 mg/dL (ref 0–40)

## 2014-11-30 ENCOUNTER — Encounter: Payer: Self-pay | Admitting: *Deleted

## 2015-02-22 ENCOUNTER — Ambulatory Visit (INDEPENDENT_AMBULATORY_CARE_PROVIDER_SITE_OTHER): Payer: BLUE CROSS/BLUE SHIELD | Admitting: Family Medicine

## 2015-02-22 ENCOUNTER — Other Ambulatory Visit: Payer: Self-pay | Admitting: *Deleted

## 2015-02-22 ENCOUNTER — Encounter: Payer: Self-pay | Admitting: Family Medicine

## 2015-02-22 VITALS — BP 130/62 | HR 76

## 2015-02-22 DIAGNOSIS — F32A Depression, unspecified: Secondary | ICD-10-CM

## 2015-02-22 DIAGNOSIS — F329 Major depressive disorder, single episode, unspecified: Secondary | ICD-10-CM | POA: Diagnosis not present

## 2015-02-22 DIAGNOSIS — F902 Attention-deficit hyperactivity disorder, combined type: Secondary | ICD-10-CM

## 2015-02-22 MED ORDER — LISDEXAMFETAMINE DIMESYLATE 70 MG PO CAPS: 70.0000 mg | ORAL_CAPSULE | ORAL | Status: DC

## 2015-02-22 MED ORDER — LOVASTATIN 40 MG PO TABS: 40.0000 mg | ORAL_TABLET | Freq: Every day | ORAL | Status: DC

## 2015-02-22 MED ORDER — LISDEXAMFETAMINE DIMESYLATE 70 MG PO CAPS
70.0000 mg | ORAL_CAPSULE | ORAL | Status: DC
Start: 2015-02-22 — End: 2015-02-22

## 2015-02-22 NOTE — Progress Notes (Signed)
   Subjective:    Patient ID: Glenda Fernandez, female    DOB: 07/12/1961, 54 y.o.   MRN: [id number]  HPI Follow-up ADHD-she's doing very well on her Vyvanse 75 mg daily. She takes it regularly. She denies any chest pain or palpitations or shortness of breath or insomnia while on the medication. She is due for a refill today.  Follow-up depression-last time I saw her she was very stressed. She was going to have to do some volunteer work [place] [employer] [place] part of her sentencing. She has completed about half of that. She spent most of the time volunteering at the [employer] and says she has actually really enjoyed it. She says in fact she used to work in [employer] and found finds it very pleasurable. When she completes her volunteer time she says she thinks she is asked going to apply to work at [employer]. She's currently taking fluoxetine 80 mg daily and doing fantastic on this regimen. She does still have little interest or pleasure in doing things several days of the week as well as feeling down several days of the week. Still complains of low energy and overeating at times.   Review of Systems     Objective:   Physical Exam  Constitutional: She is oriented to person, place, and time. She appears well-developed and well-nourished.  HENT:  Head: Normocephalic and atraumatic.  Cardiovascular: Normal rate, regular rhythm and normal heart sounds.   Pulmonary/Chest: Effort normal and breath sounds normal.  Neurological: She is alert and oriented to person, place, and time.  Skin: Skin is warm and dry.  Psychiatric: She has a normal mood and affect. Her behavior is normal.          Assessment & Plan:  ADHD-the power was out during her office visit today so I had to give her a handwritten prescription for 30 tabs of Vyvanse. I will print 2 additional prescriptions which will be mailed to her for a 90 day supply. I'll see her back in 3 months.  Depression-PHQ 9 score of 5 today. Previous 9.  Significant improvement. We'll continue current regimen and I'll see her back in 3 months. I think be fantastic if she started to work at [employer]. I think he would get her out of the house and be very positive for her.

## 2015-03-27 ENCOUNTER — Ambulatory Visit: Payer: BLUE CROSS/BLUE SHIELD | Admitting: Sports Medicine

## 2015-03-28 ENCOUNTER — Ambulatory Visit: Payer: BLUE CROSS/BLUE SHIELD | Admitting: Sports Medicine

## 2015-05-19 ENCOUNTER — Other Ambulatory Visit: Payer: Self-pay | Admitting: *Deleted

## 2015-05-19 MED ORDER — LISDEXAMFETAMINE DIMESYLATE 70 MG PO CAPS: 70.0000 mg | ORAL_CAPSULE | ORAL | Status: DC

## 2015-05-30 ENCOUNTER — Ambulatory Visit: Payer: BLUE CROSS/BLUE SHIELD | Admitting: Family Medicine

## 2015-06-21 ENCOUNTER — Other Ambulatory Visit: Payer: Self-pay

## 2015-06-21 ENCOUNTER — Ambulatory Visit: Payer: BLUE CROSS/BLUE SHIELD | Admitting: Family Medicine

## 2015-06-21 MED ORDER — LISDEXAMFETAMINE DIMESYLATE 70 MG PO CAPS: 70.0000 mg | ORAL_CAPSULE | ORAL | Status: DC

## 2015-06-27 ENCOUNTER — Ambulatory Visit: Payer: BLUE CROSS/BLUE SHIELD | Admitting: Family Medicine

## 2015-07-20 ENCOUNTER — Encounter: Payer: Self-pay | Admitting: Family Medicine

## 2015-07-20 ENCOUNTER — Ambulatory Visit (INDEPENDENT_AMBULATORY_CARE_PROVIDER_SITE_OTHER): Payer: BLUE CROSS/BLUE SHIELD | Admitting: Family Medicine

## 2015-07-20 VITALS — BP 136/73 | HR 83 | Wt 143.0 lb

## 2015-07-20 DIAGNOSIS — F329 Major depressive disorder, single episode, unspecified: Secondary | ICD-10-CM

## 2015-07-20 DIAGNOSIS — F32A Depression, unspecified: Secondary | ICD-10-CM

## 2015-07-20 DIAGNOSIS — F902 Attention-deficit hyperactivity disorder, combined type: Secondary | ICD-10-CM | POA: Diagnosis not present

## 2015-07-20 MED ORDER — LISDEXAMFETAMINE DIMESYLATE 70 MG PO CAPS: 70.0000 mg | ORAL_CAPSULE | ORAL | Status: DC

## 2015-07-20 MED ORDER — LISDEXAMFETAMINE DIMESYLATE 70 MG PO CAPS
70.0000 mg | ORAL_CAPSULE | ORAL | Status: DC
Start: 2015-07-20 — End: 2015-07-20

## 2015-07-20 NOTE — Progress Notes (Signed)
   Subjective:    Patient ID: Glenda Fernandez, female    DOB: Jun 01, 1961, 54 y.o.   MRN: [id number]  HPI Follow-up ADHD she is currently on Vyvanse 70 mg capsule and doing well on it without any problems or side effects. No chest pain palpitations or shortness of breath.  Follow-up depression-she does complain of little interest or pleasure doing things several days of the week and feeling down several days of the week. She lost her mother in law a week ago and she was an important persion in her life. She was on hospice.     Review of Systems     Objective:   Physical Exam  Constitutional: She is oriented to person, place, and time. She appears well-developed and well-nourished.  HENT:  Head: Normocephalic and atraumatic.  Cardiovascular: Normal rate, regular rhythm and normal heart sounds.   Pulmonary/Chest: Effort normal and breath sounds normal.  Neurological: She is alert and oriented to person, place, and time.  Skin: Skin is warm and dry.  Psychiatric: She has a normal mood and affect. Her behavior is normal.          Assessment & Plan:  ADHD- well controlled.  3 - 30 days supply rx given. F/U in 3 months.   Depression-PHQ 9 score of 9.  2 points were for low energy. She denies any thoughts someone and harm herself. Previous PHQ 9 score was 5. She rates her symptoms is somewhat difficult. Recommend she consider greiving counseling through hospice.  She will check into it.

## 2015-10-03 ENCOUNTER — Other Ambulatory Visit: Payer: Self-pay | Admitting: Family Medicine

## 2015-10-03 ENCOUNTER — Encounter: Payer: Self-pay | Admitting: Family Medicine

## 2015-10-03 ENCOUNTER — Ambulatory Visit (INDEPENDENT_AMBULATORY_CARE_PROVIDER_SITE_OTHER): Payer: BLUE CROSS/BLUE SHIELD | Admitting: Family Medicine

## 2015-10-03 ENCOUNTER — Ambulatory Visit (INDEPENDENT_AMBULATORY_CARE_PROVIDER_SITE_OTHER): Payer: BLUE CROSS/BLUE SHIELD

## 2015-10-03 VITALS — BP 143/73 | HR 93 | Wt 145.0 lb

## 2015-10-03 DIAGNOSIS — T148 Other injury of unspecified body region: Secondary | ICD-10-CM

## 2015-10-03 DIAGNOSIS — T148XXA Other injury of unspecified body region, initial encounter: Secondary | ICD-10-CM | POA: Insufficient documentation

## 2015-10-03 DIAGNOSIS — R102 Pelvic and perineal pain: Secondary | ICD-10-CM | POA: Diagnosis not present

## 2015-10-03 NOTE — Assessment & Plan Note (Signed)
Large hematoma present. Not yet liquefied. Advised patient of course of hematoma. This will likely liquefying eventually go away in a few weeks. She'll return to clinic if not improving. Recommend against heat. Recommend ice. Return as needed.

## 2015-10-03 NOTE — Progress Notes (Signed)
   Subjective:    I'm seeing this patient as a consultation for:  Dr. Linford ArnoldMetheney  CC: Gluteus contusion  HPI: Patient slipped and fell on the stairs 8 days ago landing on her right buttocks. She notes tremendous bruising and moderate pain. She notes a large lump on her right buttocks that is tender. She's tried ice and some oral medicines for pain. She states that she is not having trouble walking because of the pain. No fevers chills nausea vomiting or diarrhea.  Past medical history, Surgical history, Family history not pertinant except as noted below, Social history, Allergies, and medications have been entered into the medical record, reviewed, and no changes needed.   Review of Systems: No headache, visual changes, nausea, vomiting, diarrhea, constipation, dizziness, abdominal pain, skin rash, fevers, chills, night sweats, weight loss, swollen lymph nodes, body aches, joint swelling, muscle aches, chest pain, shortness of breath, mood changes, visual or auditory hallucinations.   Objective:    Filed Vitals:   10/03/15 0918  BP: 143/73  Pulse: 93   General: Well Developed, well nourished, and in no acute distress.  Neuro/Psych: Alert and oriented x3, extra-ocular muscles intact, able to move all 4 extremities, sensation grossly intact. Skin: Warm and dry, no rashes noted.  Respiratory: Not using accessory muscles, speaking in full sentences, trachea midline.  Cardiovascular: Pulses palpable, no extremity edema. Abdomen: Does not appear distended. MSK: Ecchymosis present on the entire gluteus radiating to the thigh on the posterior aspect. She is a large tender 4 x 4 centimeter firm area within the ecchymosis on the right superior gluteus. Her sacrum and coccyx are nontender. Her greater trochanters bilaterally are nontender. She has a normal gait.    x-ray of pelvis preliminary results show no fractures. Awaiting formal radiology read.  No results found for this or any previous visit  (from the past 24 hour(s)). No results found.  Impression and Recommendations:   This case required medical decision making of moderate complexity.

## 2015-10-03 NOTE — Patient Instructions (Signed)
Thank you for coming in today. Return if not better.   Hematoma A hematoma is a collection of blood under the skin, in an organ, in a body space, in a joint space, or in other tissue. The blood can clot to form a lump that you can see and feel. The lump is often firm and may sometimes become sore and tender. Most hematomas get better in a few days to weeks. However, some hematomas may be serious and require medical care. Hematomas can range in size from very small to very large. CAUSES  A hematoma can be caused by a blunt or penetrating injury. It can also be caused by spontaneous leakage from a blood vessel under the skin. Spontaneous leakage from a blood vessel is more likely to occur in older people, especially those taking blood thinners. Sometimes, a hematoma can develop after certain medical procedures. SIGNS AND SYMPTOMS   A firm lump on the body.  Possible pain and tenderness in the area.  Bruising.Blue, dark blue, purple-red, or yellowish skin may appear at the site of the hematoma if the hematoma is close to the surface of the skin. For hematomas in deeper tissues or body spaces, the signs and symptoms may be subtle. For example, an intra-abdominal hematoma may cause abdominal pain, weakness, fainting, and shortness of breath. An intracranial hematoma may cause a headache or symptoms such as weakness, trouble speaking, or a change in consciousness. DIAGNOSIS  A hematoma can usually be diagnosed based on your medical history and a physical exam. Imaging tests may be needed if your health care provider suspects a hematoma in deeper tissues or body spaces, such as the abdomen, head, or chest. These tests may include ultrasonography or a CT scan.  TREATMENT  Hematomas usually go away on their own over time. Rarely does the blood need to be drained out of the body. Large hematomas or those that may affect vital organs will sometimes need surgical drainage or monitoring. HOME CARE  INSTRUCTIONS   Apply ice to the injured area:   Put ice in a plastic bag.   Place a towel between your skin and the bag.   Leave the ice on for 20 minutes, 2-3 times a day for the first 1 to 2 days.   After the first 2 days, switch to using warm compresses on the hematoma.   Elevate the injured area to help decrease pain and swelling. Wrapping the area with an elastic bandage may also be helpful. Compression helps to reduce swelling and promotes shrinking of the hematoma. Make sure the bandage is not wrapped too tight.   If your hematoma is on a lower extremity and is painful, crutches may be helpful for a couple days.   Only take over-the-counter or prescription medicines as directed by your health care provider. SEEK IMMEDIATE MEDICAL CARE IF:   You have increasing pain, or your pain is not controlled with medicine.   You have a fever.   You have worsening swelling or discoloration.   Your skin over the hematoma breaks or starts bleeding.   Your hematoma is in your chest or abdomen and you have weakness, shortness of breath, or a change in consciousness.  Your hematoma is on your scalp (caused by a fall or injury) and you have a worsening headache or a change in alertness or consciousness. MAKE SURE YOU:   Understand these instructions.  Will watch your condition.  Will get help right away if you are not doing well  or get worse.   This information is not intended to replace advice given to you by your health care provider. Make sure you discuss any questions you have with your health care provider.   Document Released: 07/16/2004 Document Revised: 08/04/2013 Document Reviewed: 05/12/2013 Elsevier Interactive Patient Education [employer].

## 2015-10-03 NOTE — Progress Notes (Signed)
Quick Note:  Xray shows no fracture ______ 

## 2015-10-20 ENCOUNTER — Encounter: Payer: Self-pay | Admitting: Sports Medicine

## 2015-10-20 ENCOUNTER — Ambulatory Visit (INDEPENDENT_AMBULATORY_CARE_PROVIDER_SITE_OTHER): Payer: BLUE CROSS/BLUE SHIELD | Admitting: Sports Medicine

## 2015-10-20 ENCOUNTER — Ambulatory Visit (INDEPENDENT_AMBULATORY_CARE_PROVIDER_SITE_OTHER): Payer: BLUE CROSS/BLUE SHIELD | Admitting: Family Medicine

## 2015-10-20 ENCOUNTER — Ambulatory Visit (INDEPENDENT_AMBULATORY_CARE_PROVIDER_SITE_OTHER): Payer: BLUE CROSS/BLUE SHIELD

## 2015-10-20 ENCOUNTER — Encounter: Payer: Self-pay | Admitting: Family Medicine

## 2015-10-20 VITALS — BP 135/60 | HR 85 | Temp 98.9°F | Ht 67.0 in | Wt 149.2 lb

## 2015-10-20 VITALS — BP 135/60 | HR 85 | Ht 67.0 in | Wt 149.0 lb

## 2015-10-20 DIAGNOSIS — M5412 Radiculopathy, cervical region: Secondary | ICD-10-CM | POA: Diagnosis not present

## 2015-10-20 DIAGNOSIS — M7591 Shoulder lesion, unspecified, right shoulder: Secondary | ICD-10-CM | POA: Diagnosis not present

## 2015-10-20 DIAGNOSIS — M25511 Pain in right shoulder: Secondary | ICD-10-CM

## 2015-10-20 DIAGNOSIS — M50323 Other cervical disc degeneration at C6-C7 level: Secondary | ICD-10-CM

## 2015-10-20 DIAGNOSIS — F902 Attention-deficit hyperactivity disorder, combined type: Secondary | ICD-10-CM

## 2015-10-20 DIAGNOSIS — M50322 Other cervical disc degeneration at C5-C6 level: Secondary | ICD-10-CM | POA: Diagnosis not present

## 2015-10-20 DIAGNOSIS — Z9889 Other specified postprocedural states: Secondary | ICD-10-CM

## 2015-10-20 DIAGNOSIS — G9520 Unspecified cord compression: Secondary | ICD-10-CM | POA: Insufficient documentation

## 2015-10-20 DIAGNOSIS — Z23 Encounter for immunization: Secondary | ICD-10-CM | POA: Diagnosis not present

## 2015-10-20 MED ORDER — LISDEXAMFETAMINE DIMESYLATE 70 MG PO CAPS: 70.0000 mg | ORAL_CAPSULE | ORAL | Status: DC

## 2015-10-20 MED ORDER — CYCLOBENZAPRINE HCL 10 MG PO TABS: ORAL_TABLET | ORAL | Status: DC

## 2015-10-20 MED ORDER — PREDNISONE 50 MG PO TABS: ORAL_TABLET | ORAL | Status: DC

## 2015-10-20 MED ORDER — MELOXICAM 15 MG PO TABS
ORAL_TABLET | ORAL | Status: DC
Start: 2015-10-20 — End: 2016-01-22

## 2015-10-20 NOTE — Progress Notes (Signed)
   Subjective:    Patient ID: Glenda Fernandez, female    DOB: 02/05/1961, 54 y.o.   MRN: [id number]  HPI F/U ADHD - she's doing well on her current regimen of Vyvanse 70 mg daily. No problems or concerns or side effects.  Cough - Says started coughing and felt fatiged for a few days last week but then started to feel better but then he last few days has been coughing more. Some sputum but also has a tickle in her throat.   Tob abuse - she has thought about switching to vaping. She knows she needs to quit but she isn't quit ready.  Review of Systems     Objective:   Physical Exam  Constitutional: She is oriented to person, place, and time. She appears well-developed and well-nourished.  HENT:  Head: Normocephalic and atraumatic.  Right Ear: External ear normal.  Left Ear: External ear normal.  Nose: Nose normal.  Mouth/Throat: Oropharynx is clear and moist.  TMs and canals are clear.   Eyes: Conjunctivae and EOM are normal. Pupils are equal, round, and reactive to light.  Neck: Neck supple. No thyromegaly present.  Cardiovascular: Normal rate, regular rhythm and normal heart sounds.   Pulmonary/Chest: Effort normal and breath sounds normal. She has no wheezes.  Lymphadenopathy:    She has no cervical adenopathy.  Neurological: She is alert and oriented to person, place, and time.  Skin: Skin is warm and dry.  Psychiatric: She has a normal mood and affect. Her behavior is normal.          Assessment & Plan:  ADHD - doing well on her current regime. Filled for 3 months.  F/U in 3 months.   Cough - could be allergic. Not convincing for bronchitis.  Recommend a trial of over-the-counter any stream. She feels that she's getting worse over the weekend with increased shortness of breath or production of sputum and please give us a call back early next week so we can call in an antibiotic and round of prednisone if needed.  Tob abuse - encouraged cessation. We did discusssed can taper  nicotin with vaping.  That is an options   Discussed screening colonoscopy - she declines colonoscopy but is interested in Cologuard. Additional information given and handout facts to the company.

## 2015-10-20 NOTE — Assessment & Plan Note (Signed)
Clinically represents right C7 radiculopathy, x-rays, prednisone, meloxicam, formal PT. Return to see me in one month, MRI for interventional if no better. Her pain is multifactorial and she does have evidence of glenohumeral mediated pain so we injected the glenohumeral joint today for diagnostic and therapeutic purposes.

## 2015-10-20 NOTE — Assessment & Plan Note (Signed)
Clinically represents right C7 radiculopathy, x-rays, prednisone, meloxicam, formal PT. Return to see me in one month, MRI for interventional if no better. Her pain is multifactorial and she does have evidence of glenohumeral mediated pain so we injected the glenohumeral joint today for diagnostic and therapeutic purposes. On exam it does seem as though she is post-distal clavicular excision as part of her prior subacromial decompression.

## 2015-10-20 NOTE — Progress Notes (Signed)
   Subjective:    I'm seeing this patient as a consultation for:  Dr. Nani Gasseratherine Metheney  CC: Right shoulder pain  HPI: For the past several weeks this pleasant [id number] year old female has had increasing pain that she localizes both at the anterior and posterior joint lines of the shoulder, as well as in the neck radiating down the dorsum of the right arm. Pain is moderate, persistent. She is post subacromial decompression with distal clavicular excision. No bowel or bladder dysfunction, saddle numbness, constitutional symptoms.  Past medical history, Surgical history, Family history not pertinant except as noted below, Social history, Allergies, and medications have been entered into the medical record, reviewed, and no changes needed.   Review of Systems: No headache, visual changes, nausea, vomiting, diarrhea, constipation, dizziness, abdominal pain, skin rash, fevers, chills, night sweats, weight loss, swollen lymph nodes, body aches, joint swelling, muscle aches, chest pain, shortness of breath, mood changes, visual or auditory hallucinations.   Objective:   General: Well Developed, well nourished, and in no acute distress.  Neuro/Psych: Alert and oriented x3, extra-ocular muscles intact, able to move all 4 extremities, sensation grossly intact. Skin: Warm and dry, no rashes noted.  Respiratory: Not using accessory muscles, speaking in full sentences, trachea midline.  Cardiovascular: Pulses palpable, no extremity edema. Abdomen: Does not appear distended. Neck: Negative spurling's Full neck range of motion Grip strength and sensation normal in bilateral hands Strength good C4 to T1 distribution No sensory change to C4 to T1 Reflexes normal Right Shoulder: Inspection reveals no abnormalities, atrophy or asymmetry. Palpation is normal with no tenderness over AC joint or bicipital groove. ROM is full in all planes. Rotator cuff strength normal throughout. No signs of impingement with  negative Neer and Hawkin's tests, empty can. Speeds and Yergason's tests normal. Positive crank test Normal scapular function observed. No painful arc and no drop arm sign. No apprehension sign  Procedure: Real-time Ultrasound Guided Injection of right glenohumeral joint Device: GE Logiq E  Verbal informed consent obtained.  Time-out conducted.  Noted no overlying erythema, induration, or other signs of local infection.  Skin prepped in a sterile fashion.  Local anesthesia: Topical Ethyl chloride.  With sterile technique and under real time ultrasound guidance:  Using a 7 inch spinal needle advanced into the glenohumeral joint taking care to avoid the labrum and 1 mL kenalog 40, 4 mL lidocaine injected easily. Completed without difficulty  Pain immediately resolved suggesting accurate placement of the medication.  Advised to call if fevers/chills, erythema, induration, drainage, or persistent bleeding.  Images permanently stored and available for review in the ultrasound unit.  Impression: Technically successful ultrasound guided injection.  X-rays of the neck and shoulder reviewed, there is evidence of a distal clavicular excision, she also has multilevel widespread cervical degenerative disc disease.  Impression and Recommendations:   This case required medical decision making of moderate complexity.

## 2015-11-20 ENCOUNTER — Ambulatory Visit: Payer: BLUE CROSS/BLUE SHIELD | Admitting: Sports Medicine

## 2015-12-28 ENCOUNTER — Other Ambulatory Visit: Payer: Self-pay | Admitting: Family Medicine

## 2016-01-02 ENCOUNTER — Telehealth: Payer: Self-pay | Admitting: Family Medicine

## 2016-01-02 NOTE — Telephone Encounter (Signed)
Spoke with Pt advised her Cologuard kit needs to be completed and returned in 30 days or the kit will be cancelled. Pt states she is going to contact her [employer] and make sure they will cover it then she will complete within 30 days.

## 2016-01-16 ENCOUNTER — Other Ambulatory Visit: Payer: Self-pay | Admitting: Family Medicine

## 2016-01-16 DIAGNOSIS — F902 Attention-deficit hyperactivity disorder, combined type: Secondary | ICD-10-CM

## 2016-01-16 MED ORDER — LISDEXAMFETAMINE DIMESYLATE 70 MG PO CAPS: 70.0000 mg | ORAL_CAPSULE | ORAL | Status: DC

## 2016-01-19 ENCOUNTER — Ambulatory Visit: Payer: BLUE CROSS/BLUE SHIELD | Admitting: Family Medicine

## 2016-01-22 ENCOUNTER — Encounter: Payer: Self-pay | Admitting: Family Medicine

## 2016-01-22 ENCOUNTER — Ambulatory Visit (INDEPENDENT_AMBULATORY_CARE_PROVIDER_SITE_OTHER): Payer: BLUE CROSS/BLUE SHIELD | Admitting: Family Medicine

## 2016-01-22 VITALS — BP 130/79 | HR 85 | Ht 67.0 in | Wt 146.3 lb

## 2016-01-22 DIAGNOSIS — F902 Attention-deficit hyperactivity disorder, combined type: Secondary | ICD-10-CM

## 2016-01-22 DIAGNOSIS — E785 Hyperlipidemia, unspecified: Secondary | ICD-10-CM | POA: Diagnosis not present

## 2016-01-22 DIAGNOSIS — K0889 Other specified disorders of teeth and supporting structures: Secondary | ICD-10-CM | POA: Diagnosis not present

## 2016-01-22 MED ORDER — LISDEXAMFETAMINE DIMESYLATE 70 MG PO CAPS: 70.0000 mg | ORAL_CAPSULE | ORAL | Status: DC

## 2016-01-22 MED ORDER — FLUOXETINE HCL 40 MG PO CAPS: 80.0000 mg | ORAL_CAPSULE | Freq: Every day | ORAL | Status: DC

## 2016-01-22 NOTE — Progress Notes (Signed)
   Subjective:    Patient ID: Glenda Fernandez, female    DOB: 03/29/1961, 55 y.o.   MRN: [id number]  HPI Follow up  ADHD-she's currently on Vyvanse 70 mg daily. No CP or SOB. No palpitations.    Follow-up depression-she does complain of feeling down several days of the week and trouble falling asleep. Also complains of feeling tired and having low energy and difficulty with concentration. She denies any thoughts of wanting to harm herself.  Having toothach on the right jaw. Says  painful with hot and cold.  She has been careful eating. Has dental insurance.  She hasn't made a dental appointment.    Review of Systems     Objective:   Physical Exam  Constitutional: She is oriented to person, place, and time. She appears well-developed and well-nourished.  HENT:  Head: Normocephalic and atraumatic.  Right posterior molar on the right lower jaw is nontender. No significant swelling around the gum. No abscess or drainage on exam.  Cardiovascular: Normal rate, regular rhythm and normal heart sounds.   Pulmonary/Chest: Effort normal and breath sounds normal.  Neurological: She is alert and oriented to person, place, and time.  Skin: Skin is warm and dry.  Psychiatric: She has a normal mood and affect. Her behavior is normal.       Assessment & Plan:  ADHD- well controlled.  Continue current regimen.  F/U in 3months.    Depression-PHQ 9 score of 5 today, previous of 9. She rates her symptoms is somewhat difficult but overall is doing well. Continue fluoxetine 40 mg daily.  Dental pain - OK ot use NSAIDs. Really needs to schedule with her dentist. She has dental coverage.  The tooth itself is not tender to touch and I don't see any swelling around the gum. If she feels like it's starting to get infected them please let me know.

## 2016-04-16 ENCOUNTER — Telehealth: Payer: Self-pay | Admitting: *Deleted

## 2016-04-16 DIAGNOSIS — F902 Attention-deficit hyperactivity disorder, combined type: Secondary | ICD-10-CM

## 2016-04-16 MED ORDER — LISDEXAMFETAMINE DIMESYLATE 70 MG PO CAPS: 70.0000 mg | ORAL_CAPSULE | ORAL | Status: DC

## 2016-04-16 NOTE — Telephone Encounter (Signed)
Pt called and stated that her daughter had her baby early and they share a car and wanted to know if she could get a 1 month refill of her vyvanse. She will reschedule her appt .Loralee PacasBarkley, Glenda Fernandez [place]

## 2016-04-19 ENCOUNTER — Ambulatory Visit: Payer: BLUE CROSS/BLUE SHIELD | Admitting: Family Medicine

## 2016-05-07 ENCOUNTER — Ambulatory Visit: Payer: BLUE CROSS/BLUE SHIELD | Admitting: Family Medicine

## 2016-05-14 ENCOUNTER — Ambulatory Visit (INDEPENDENT_AMBULATORY_CARE_PROVIDER_SITE_OTHER): Payer: BLUE CROSS/BLUE SHIELD | Admitting: Family Medicine

## 2016-05-14 ENCOUNTER — Encounter: Payer: Self-pay | Admitting: Family Medicine

## 2016-05-14 VITALS — BP 138/72 | HR 91 | Wt 142.0 lb

## 2016-05-14 DIAGNOSIS — F902 Attention-deficit hyperactivity disorder, combined type: Secondary | ICD-10-CM | POA: Diagnosis not present

## 2016-05-14 DIAGNOSIS — Z1231 Encounter for screening mammogram for malignant neoplasm of breast: Secondary | ICD-10-CM | POA: Diagnosis not present

## 2016-05-14 DIAGNOSIS — F329 Major depressive disorder, single episode, unspecified: Secondary | ICD-10-CM | POA: Diagnosis not present

## 2016-05-14 DIAGNOSIS — F32A Depression, unspecified: Secondary | ICD-10-CM

## 2016-05-14 MED ORDER — LISDEXAMFETAMINE DIMESYLATE 70 MG PO CAPS: 70.0000 mg | ORAL_CAPSULE | ORAL | Status: DC

## 2016-05-14 NOTE — Progress Notes (Signed)
Subjective:    CC: ADHD  HPI:  Follow-up ADHD-doing very well on her Vyvanse 70 mg daily. No chest pain shortness breath palpitations or insomnia on the medication. She is due for refills. Next  Follow-up depression-she is doing well on fluoxetine 40 mg daily without any side effects or problems. She does still complain of decreased pleasure in doing things several days of the week and low energy. She denies feeling depressed and denies any thoughts of going to harm herself. Previous PHQ 9 was 5.   Past medical history, Surgical history, Family history not pertinant except as noted below, Social history, Allergies, and medications have been entered into the medical record, reviewed, and corrections made.   Review of Systems: No fevers, chills, night sweats, weight loss, chest pain, or shortness of breath.   Objective:    General: Well Developed, well nourished, and in no acute distress.  Neuro: Alert and oriented x3, extra-ocular muscles intact, sensation grossly intact.  HEENT: Normocephalic, atraumatic  Skin: Warm and dry, no rashes. Cardiac: Regular rate and rhythm, no murmurs rubs or gallops, no lower extremity edema.  Respiratory: Clear to auscultation bilaterally. Not using accessory muscles, speaking in full sentences.   Impression and Recommendations:   ADHD-well controlled on current regimen. Refills provided for the next 90 days. Follow-up in 3 months. Blood pressure   Depression-PHQ 9 score of 3 today which is down from previous of 5. She does have a new grandbaby that was just one which she is very excited about but does still have her daughter and her daughter's family living with her which can be stressful at times.  Elevated blood pressure-last blood pressure was at goal. We'll continue to monitor carefully.

## 2016-06-05 ENCOUNTER — Ambulatory Visit: Payer: BLUE CROSS/BLUE SHIELD

## 2016-06-12 ENCOUNTER — Ambulatory Visit (INDEPENDENT_AMBULATORY_CARE_PROVIDER_SITE_OTHER): Payer: BLUE CROSS/BLUE SHIELD

## 2016-06-12 DIAGNOSIS — Z1231 Encounter for screening mammogram for malignant neoplasm of breast: Secondary | ICD-10-CM | POA: Diagnosis not present

## 2016-08-07 ENCOUNTER — Other Ambulatory Visit: Payer: Self-pay | Admitting: *Deleted

## 2016-08-07 DIAGNOSIS — F902 Attention-deficit hyperactivity disorder, combined type: Secondary | ICD-10-CM

## 2016-08-07 MED ORDER — LISDEXAMFETAMINE DIMESYLATE 70 MG PO CAPS: 70.0000 mg | ORAL_CAPSULE | ORAL | 0 refills | Status: DC

## 2016-08-07 MED ORDER — FLUOXETINE HCL 40 MG PO CAPS: 80.0000 mg | ORAL_CAPSULE | Freq: Every day | ORAL | 1 refills | Status: DC

## 2016-08-15 ENCOUNTER — Ambulatory Visit: Payer: BLUE CROSS/BLUE SHIELD | Admitting: Family Medicine

## 2016-08-16 ENCOUNTER — Ambulatory Visit (INDEPENDENT_AMBULATORY_CARE_PROVIDER_SITE_OTHER): Payer: BLUE CROSS/BLUE SHIELD | Admitting: Family Medicine

## 2016-08-16 ENCOUNTER — Encounter: Payer: Self-pay | Admitting: Family Medicine

## 2016-08-16 VITALS — BP 122/56 | HR 75 | Ht 67.0 in | Wt 145.0 lb

## 2016-08-16 DIAGNOSIS — Z23 Encounter for immunization: Secondary | ICD-10-CM

## 2016-08-16 DIAGNOSIS — L03114 Cellulitis of left upper limb: Secondary | ICD-10-CM

## 2016-08-16 MED ORDER — SULFAMETHOXAZOLE-TRIMETHOPRIM 800-160 MG PO TABS: 1.0000 | ORAL_TABLET | Freq: Two times a day (BID) | ORAL | 0 refills | Status: DC

## 2016-08-16 NOTE — Progress Notes (Signed)
Subjective:    CC: swelling on dorsum of the left hand.    HPI: Patient comes in today with some swelling on the dorsum of her left hand. She noticed a crack along the MCP joint on her first finger on that hand yesterday. She said that she woke up this morning there was just a little bit of swelling posteriorly. She had a hand infection in that left hand back in 2015 which cultured positive for MRSA. She is worried because the last time she ended up being hospitalized to receive Aviane. She says now this afternoon it has swollen even more. She denies any fever or chills. She says it's mostly itchy. She does have dogs. And she denies any bites or trauma.  Past medical history, Surgical history, Family history not pertinant except as noted below, Social history, Allergies, and medications have been entered into the medical record, reviewed, and corrections made.   Review of Systems: No fevers, chills, night sweats, weight loss, chest pain, or shortness of breath.   Objective:    General: Well Developed, well nourished, and in no acute distress.  Neuro: Alert and oriented x3, extra-ocular muscles intact, sensation grossly intact.  HEENT: Normocephalic, atraumatic  Skin: Warm and dry, no rashes.She does have some significant edema of the dorsum of the left hand. Unable to visualize the tendons and she can in the right hand. Along the first finger near the MCP joint she has a fairly large crack in the skin. No active drainage or abscess formation. Cardiac: Regular rate and rhythm, no murmurs rubs or gallops, no lower extremity edema.  Respiratory: Clear to auscultation bilaterally. Not using accessory muscles, speaking in full sentences.      Impression and Recommendations:   Left hand cellulitis-we'll go ahead and treat with Bactrim to cover for MRSA. Go to ED over the weekend if progressing or getting worse or fever.

## 2016-08-30 ENCOUNTER — Ambulatory Visit: Payer: BLUE CROSS/BLUE SHIELD | Admitting: Family Medicine

## 2016-09-05 ENCOUNTER — Other Ambulatory Visit: Payer: Self-pay | Admitting: Family Medicine

## 2016-09-05 DIAGNOSIS — E785 Hyperlipidemia, unspecified: Secondary | ICD-10-CM

## 2016-09-05 LAB — COMPLETE METABOLIC PANEL WITH GFR
ALBUMIN: 4.1 g/dL (ref 3.6–5.1)
ALK PHOS: 53 U/L (ref 33–130)
ALT: 11 U/L (ref 6–29)
AST: 17 U/L (ref 10–35)
BUN: 14 mg/dL (ref 7–25)
CALCIUM: 8.4 mg/dL — AB (ref 8.6–10.4)
CO2: 25 mmol/L (ref 20–31)
CREATININE: 0.65 mg/dL (ref 0.50–1.05)
Chloride: 105 mmol/L (ref 98–110)
GFR, Est African American: >89 mL/min (ref >=60)
GFR, Est Non African American: >89 mL/min (ref >=60)
Glucose, Bld: 102 mg/dL — ABNORMAL HIGH (ref 65–99)
Potassium: 3.8 mmol/L (ref 3.5–5.3)
Sodium: 142 mmol/L (ref 135–146)
TOTAL PROTEIN: 6.3 g/dL (ref 6.1–8.1)
Total Bilirubin: 0.2 mg/dL (ref 0.2–1.2)

## 2016-09-05 LAB — LIPID PANEL
CHOLESTEROL: 243 mg/dL — AB (ref 125–200)
HDL: 48 mg/dL (ref >=46)
LDL Cholesterol: 173 mg/dL — ABNORMAL HIGH (ref <130)
Total CHOL/HDL Ratio: 5.1 Ratio — ABNORMAL HIGH (ref <=5.0)
Triglycerides: 109 mg/dL (ref <150)
VLDL: 22 mg/dL (ref <30)

## 2016-09-06 ENCOUNTER — Encounter: Payer: Self-pay | Admitting: Family Medicine

## 2016-09-06 ENCOUNTER — Ambulatory Visit (INDEPENDENT_AMBULATORY_CARE_PROVIDER_SITE_OTHER): Payer: BLUE CROSS/BLUE SHIELD | Admitting: Family Medicine

## 2016-09-06 VITALS — BP 136/74 | HR 95 | Wt 142.0 lb

## 2016-09-06 DIAGNOSIS — F902 Attention-deficit hyperactivity disorder, combined type: Secondary | ICD-10-CM

## 2016-09-06 MED ORDER — LISDEXAMFETAMINE DIMESYLATE 70 MG PO CAPS: 70.0000 mg | ORAL_CAPSULE | ORAL | 0 refills | Status: DC

## 2016-09-06 NOTE — Progress Notes (Signed)
Subjective:    CC: ADHD  HPI:  F/U ADHD -  Doing well on Vyvanse 70 mg. No chest pain shortest breath or palpitations. Tolerating the medication well and feels like it's working well. Happy with regimen.  Past medical history, Surgical history, Family history not pertinant except as noted below, Social history, Allergies, and medications have been entered into the medical record, reviewed, and corrections made.   Review of Systems: No fevers, chills, night sweats, weight loss, chest pain, or shortness of breath.   Objective:    General: Well Developed, well nourished, and in no acute distress.  Neuro: Alert and oriented x3, extra-ocular muscles intact, sensation grossly intact.  HEENT: Normocephalic, atraumatic  Skin: Warm and dry, no rashes. Cardiac: Regular rate and rhythm, no murmurs rubs or gallops, no lower extremity edema.  Respiratory: Clear to auscultation bilaterally. Not using accessory muscles, speaking in full sentences.   Impression and Recommendations:    ADHD - Doing very well with regimen. Okay for 90 day refill. BP at goal. Follow-up in 3 months.

## 2016-10-11 ENCOUNTER — Emergency Department
Admission: EM | Admit: 2016-10-11 | Discharge: 2016-10-11 | Disposition: A | Discharge status: Home / Self Care | Payer: BLUE CROSS/BLUE SHIELD | Source: Home / Self Care | Attending: Family Medicine | Admitting: Family Medicine

## 2016-10-11 ENCOUNTER — Emergency Department (INDEPENDENT_AMBULATORY_CARE_PROVIDER_SITE_OTHER): Payer: BLUE CROSS/BLUE SHIELD

## 2016-10-11 DIAGNOSIS — S52601A Unspecified fracture of lower end of right ulna, initial encounter for closed fracture: Principal | ICD-10-CM

## 2016-10-11 DIAGNOSIS — S52611A Displaced fracture of right ulna styloid process, initial encounter for closed fracture: Secondary | ICD-10-CM

## 2016-10-11 DIAGNOSIS — S52501A Unspecified fracture of the lower end of right radius, initial encounter for closed fracture: Secondary | ICD-10-CM

## 2016-10-11 DIAGNOSIS — M25531 Pain in right wrist: Secondary | ICD-10-CM | POA: Diagnosis not present

## 2016-10-11 DIAGNOSIS — W19XXXA Unspecified fall, initial encounter: Secondary | ICD-10-CM | POA: Diagnosis not present

## 2016-10-11 MED ORDER — HYDROCODONE-ACETAMINOPHEN 5-325 MG PO TABS: 1.0000 | ORAL_TABLET | Freq: Three times a day (TID) | ORAL | 0 refills | Status: DC | PRN

## 2016-10-11 MED ORDER — IBUPROFEN 600 MG PO TABS
600.0000 mg | ORAL_TABLET | Freq: Once | ORAL | Status: AC
  Administered 2016-10-11: 600 mg via ORAL

## 2016-10-11 NOTE — ED Provider Notes (Signed)
[facility]    CSN: [id number] Arrival date & time: 10/11/16  1546     History   Chief Complaint Chief Complaint  Patient presents with  . Wrist Pain    HPI Glenda Fernandez is a 55 y.o. female.   While off-loading a pickup truck about two hours ago, patient fell on her right wrist and felt a painful popping sensation.   The history is provided by the patient.  Wrist Pain  This is a new problem. Episode onset: 2 hours ago. The problem occurs constantly. The problem has not changed since onset.Associated symptoms comments: none. Exacerbated by: right wrist movement. Nothing relieves the symptoms. Treatments tried: ice pack. The treatment provided no relief.    History reviewed. No pertinent past medical history.  Patient Active Problem List   Diagnosis Date Noted  . Fracture of distal radius and ulna, right, closed, initial encounter 10/11/2016  . Right cervical radiculopathy 10/20/2015  . Right shoulder pain 10/20/2015  . Patellar fracture 06/22/2014  . Narcotic abuse in remission 09/14/2012  . Anemia, unspecified 09/11/2012  . Depression 09/11/2012  . Hyperlipidemia 09/10/2012  . ADHD (attention deficit hyperactivity disorder) 09/10/2012  . Tobacco abuse 09/10/2012  . INCONTINENCE, STRESS, FEMALE 09/23/2006  . INSOMNIA NOS 09/23/2006    Past Surgical History:  Procedure Laterality Date  . BACK SURGERY  2001  . BLADDER SUSPENSION    . BREAST ENHANCEMENT SURGERY Left 1879  . HAND SURGERY Left 2013   for fracture, [facility]  . NECK SURGERY    . SHOULDER SURGERY  2005    OB History    No data available       Home Medications    Prior to Admission medications   Medication Sig Start Date End Date Taking? Authorizing Provider  FLUoxetine (PROZAC) 40 MG capsule Take 2 capsules (80 mg total) by mouth daily. 08/07/16   Agapito Games, MD  HYDROcodone-acetaminophen (NORCO/VICODIN) 5-325 MG tablet Take 1 tablet by mouth every 8 (eight) hours as  needed for moderate pain. 10/11/16   Monica Becton, MD  lisdexamfetamine (VYVANSE) 70 MG capsule Take 1 capsule (70 mg total) by mouth every morning. Ok to fill 11/05/2016 09/06/16   Agapito Games, MD  lovastatin (MEVACOR) 40 MG tablet Take 1 tablet (40 mg total) by mouth at bedtime. 02/22/15   Agapito Games, MD    Family History Family History  Problem Relation Age of Onset  . Coronary artery disease Father   . Lung cancer Father     mesothelioma  . Hyperlipidemia Father   . Alcohol abuse      uncle  . Breast cancer Mother   . Kidney failure Mother   . Hyperlipidemia Mother   . Bipolar disorder Daughter   . Depression Daughter     Social History Social History  Substance Use Topics  . Smoking status: Current Every Day Smoker    Packs/day: 1.00  . Smokeless tobacco: Not on file  . Alcohol use Not on file     Allergies   Review of patient's allergies indicates no known allergies.   Review of Systems Review of Systems  All other systems reviewed and are negative.    Physical Exam Triage Vital Signs ED Triage Vitals  Enc Vitals Group     BP 10/11/16 1624 164/75     Pulse Rate 10/11/16 1624 76     Resp --      Temp 10/11/16 1624 98.1 F (36.7 C)  Temp Source 10/11/16 1624 Oral     SpO2 10/11/16 1624 98 %     Weight 10/11/16 1624 135 lb (61.2 kg)     Height 10/11/16 1624 5' 7.5" (1.715 m)     Head Circumference --      Peak Flow --      Pain Score 10/11/16 1626 5     Pain Loc --      Pain Edu? --      Excl. in GC? --    No data found.   Updated Vital Signs BP 164/75 (BP Location: Left Arm)   Pulse 76   Temp 98.1 F (36.7 C) (Oral)   Ht 5' 7.5" (1.715 m)   Wt 135 lb (61.2 kg)   SpO2 98%   BMI 20.83 kg/m   Visual Acuity Right Eye Distance:   Left Eye Distance:   Bilateral Distance:    Right Eye Near:   Left Eye Near:    Bilateral Near:     Physical Exam  Constitutional: She appears well-developed and well-nourished.  No distress.  HENT:  Head: Atraumatic.  Right Ear: External ear normal.  Left Ear: External ear normal.  Nose: Nose normal.  Mouth/Throat: Oropharynx is clear and moist.  Eyes: Pupils are equal, round, and reactive to light.  Neck: Normal range of motion.  Cardiovascular: Normal heart sounds.   Pulmonary/Chest: Breath sounds normal.  Abdominal: There is no tenderness.  Musculoskeletal:       Right wrist: She exhibits decreased range of motion, tenderness, bony tenderness, swelling and deformity.       Arms: Right wrist is mildly swollen and tender to palpation with decreased range of motion.  Distal neurovascular function is intact.   Neurological: She is alert.  Skin: Skin is warm and dry.  Nursing note and vitals reviewed.    UC Treatments / Results  Labs (all labs ordered are listed, but only abnormal results are displayed) Labs Reviewed - No data to display  EKG  EKG Interpretation None       Radiology Dg Wrist Complete Right  Result Date: 10/11/2016 CLINICAL DATA:  Fall today.  Right wrist injury. EXAM: RIGHT WRIST - COMPLETE 3+ VIEW COMPARISON:  None. FINDINGS: There is a E fracture through the distal right radius with slight posterior displacement and angulation of distal fragments. Small fracture fragment off the tip of the ulnar styloid. Diffuse soft tissue swelling. IMPRESSION: Slightly displaced and angulated distal right radial fracture. Ulnar styloid fracture. Electronically Signed   By: Charlett NoseKevin  Dover M.D.   On: 10/11/2016 16:45    Procedures Procedures (including critical care time)  Medications Ordered in UC Medications  ibuprofen (ADVIL,MOTRIN) tablet 600 mg (600 mg Oral Given 10/11/16 1628)     Initial Impression / Assessment and Plan / UC Course  I have reviewed the triage vital signs and the nursing notes.  Pertinent labs & imaging results that were available during my care of the patient were reviewed by me and considered in my medical decision  making (see chart for details).  Clinical Course  Referred to Dr. Rodney Langtonhomas Thekkekandam for fracture reduction, fracture management and follow-up.  Final Clinical Impressions(s) / UC Diagnoses   Final diagnoses:  Closed fracture of distal end of right radius, unspecified fracture morphology, initial encounter  Closed displaced fracture of styloid process of right ulna, initial encounter    New Prescriptions Current Discharge Medication List    START taking these medications   Details  HYDROcodone-acetaminophen (NORCO/VICODIN) 5-325  MG tablet Take 1 tablet by mouth every 8 (eight) hours as needed for moderate pain. Qty: 40 tablet, Refills: 0         Lattie Haw, MD 10/20/16 1126

## 2016-10-11 NOTE — Consult Note (Signed)
   Subjective:    I'm seeing this patient as a consultation for:  Dr. Donna ChristenStephen Beese  CC: Right distal radius fracture  HPI: Earlier today this pleasant 55 year old female fell out of a pickup truck, she had immediate pain, swelling, bruising, and deformity of her right forearm. X-rays showed a displaced, angulated distal radius fracture and I was called for further evaluation and definitive treatment.  Pain is severe, localized at the distal radius without radiation.  Past medical history:  Negative.  See flowsheet/record as well for more information.  Surgical history: Negative.  See flowsheet/record as well for more information.  Family history: Negative.  See flowsheet/record as well for more information.  Social history: Negative.  See flowsheet/record as well for more information.  Allergies, and medications have been entered into the medical record, reviewed, and no changes needed.   Review of Systems: No headache, visual changes, nausea, vomiting, diarrhea, constipation, dizziness, abdominal pain, skin rash, fevers, chills, night sweats, weight loss, swollen lymph nodes, body aches, joint swelling, muscle aches, chest pain, shortness of breath, mood changes, visual or auditory hallucinations.   Objective:   General: Well Developed, well nourished, and in no acute distress.  Neuro/Psych: Alert and oriented x3, extra-ocular muscles intact, able to move all 4 extremities, sensation grossly intact. Skin: Warm and dry, no rashes noted.  Respiratory: Not using accessory muscles, speaking in full sentences, trachea midline.  Cardiovascular: Pulses palpable, no extremity edema. Abdomen: Does not appear distended. Right wrist: Swollen, bruised, mild dinner fork-appearing deformity.  X-rays reviewed and show a Colles' type fracture of the distal radius with dorsal displacement and apex volar angulation.  Procedure:  Fracture Reduction   Risks, benefits, and alternatives explained and  consent obtained. Time out conducted. Surface prepped with alcohol. 10 mL lidocaine, 10 mL Marcaine infiltrated in a hematoma block. Adequate anesthesia ensured. Fracture reduction: Using finger traps to aid in traction I briefly accentuated and then reduced the fracture, sugar tong splint was placed. Post reduction films obtained showed anatomic/near-anatomic alignment. Pt stable, aftercare and follow-up advised.  Impression and Recommendations:   This case required medical decision making of moderate complexity.  1. Distal radius fracture: Closed reduction as above, sugar tong splint, hydrocodone for pain, sling. Return to see me middle of next week, x-ray before visit.  I billed a fracture code for this encounter, all subsequent visits will be post-op checks in the global period.

## 2016-10-11 NOTE — ED Triage Notes (Signed)
Pt was in the back of pick-up getting things unloaded, feet became tangled and fell on right wrist.  Pt stated that she heard a pop when she fell.  Slight swelling, no bruising. Pain with movement

## 2016-10-16 ENCOUNTER — Ambulatory Visit: Payer: BLUE CROSS/BLUE SHIELD | Admitting: Sports Medicine

## 2016-10-17 ENCOUNTER — Encounter: Payer: Self-pay | Admitting: Sports Medicine

## 2016-10-17 ENCOUNTER — Ambulatory Visit (INDEPENDENT_AMBULATORY_CARE_PROVIDER_SITE_OTHER): Payer: BLUE CROSS/BLUE SHIELD

## 2016-10-17 ENCOUNTER — Ambulatory Visit (INDEPENDENT_AMBULATORY_CARE_PROVIDER_SITE_OTHER): Payer: BLUE CROSS/BLUE SHIELD | Admitting: Sports Medicine

## 2016-10-17 DIAGNOSIS — S52601D Unspecified fracture of lower end of right ulna, subsequent encounter for closed fracture with routine healing: Secondary | ICD-10-CM | POA: Diagnosis not present

## 2016-10-17 DIAGNOSIS — S52501A Unspecified fracture of the lower end of right radius, initial encounter for closed fracture: Secondary | ICD-10-CM

## 2016-10-17 DIAGNOSIS — W19XXXD Unspecified fall, subsequent encounter: Secondary | ICD-10-CM

## 2016-10-17 DIAGNOSIS — S52601A Unspecified fracture of lower end of right ulna, initial encounter for closed fracture: Principal | ICD-10-CM

## 2016-10-17 DIAGNOSIS — S52501D Unspecified fracture of the lower end of right radius, subsequent encounter for closed fracture with routine healing: Secondary | ICD-10-CM

## 2016-10-17 NOTE — Assessment & Plan Note (Signed)
1 week post closed reduction of the distal radius and ulnar styloid fracture, doing well, overall pain-free. She has been smoking a lot and she has lost some reduction, but it is still within acceptable limits. Short arm cast as above. Return in 2 weeks, x-ray before visit.

## 2016-10-17 NOTE — Progress Notes (Signed)
  Subjective: 1 week post closed reduction of a right distal radius fracture with ulnar styloid fracture, doing well. Unfortunately she has been smoking quite a bit.  Objective: General: Well-developed, well-nourished, and in no acute distress. Right arm: Splint is removed, still with some swelling and minimal pain over the fracture, neurovascularly intact distally.  X-ray show some slight loss of reduction.  Short arm cast placed.  Assessment/plan:   Fracture of distal radius and ulna, right, closed, initial encounter 1 week post closed reduction of the distal radius and ulnar styloid fracture, doing well, overall pain-free. She has been smoking a lot and she has lost some reduction, but it is still within acceptable limits. Short arm cast as above. Return in 2 weeks, x-ray before visit.

## 2016-10-21 ENCOUNTER — Telehealth: Payer: Self-pay

## 2016-10-21 NOTE — Telephone Encounter (Signed)
Yes, that's fine, I reduced a distal radius fracture, just let her know that this will be the last refill.

## 2016-10-21 NOTE — Telephone Encounter (Signed)
Glenda Fernandez called and states she needs a refill on the Hydrocodone. I advised her she should have 10 pills left. She stated in the beginning she was taking 2 every 8 hours and Dr Benjamin Stainhekkekandam was aware. Is it ok to refill the hydrocodone? Please advise.

## 2016-10-22 MED ORDER — HYDROCODONE-ACETAMINOPHEN 5-325 MG PO TABS: 1.0000 | ORAL_TABLET | Freq: Three times a day (TID) | ORAL | 0 refills | Status: DC | PRN

## 2016-10-22 NOTE — Telephone Encounter (Signed)
Rx printed. Pt advised Rx was ready, she will pick up today. Pt advised this would be the last Rx, verbalized understanding.

## 2016-10-31 ENCOUNTER — Encounter: Payer: Self-pay | Admitting: Sports Medicine

## 2016-10-31 ENCOUNTER — Other Ambulatory Visit: Payer: Self-pay | Admitting: Orthopedic Surgery

## 2016-10-31 ENCOUNTER — Ambulatory Visit (INDEPENDENT_AMBULATORY_CARE_PROVIDER_SITE_OTHER): Payer: BLUE CROSS/BLUE SHIELD | Admitting: Sports Medicine

## 2016-10-31 ENCOUNTER — Ambulatory Visit (INDEPENDENT_AMBULATORY_CARE_PROVIDER_SITE_OTHER): Payer: BLUE CROSS/BLUE SHIELD

## 2016-10-31 DIAGNOSIS — S52601A Unspecified fracture of lower end of right ulna, initial encounter for closed fracture: Secondary | ICD-10-CM

## 2016-10-31 DIAGNOSIS — S52501D Unspecified fracture of the lower end of right radius, subsequent encounter for closed fracture with routine healing: Secondary | ICD-10-CM

## 2016-10-31 DIAGNOSIS — S52531P Colles' fracture of right radius, subsequent encounter for closed fracture with malunion: Secondary | ICD-10-CM | POA: Diagnosis not present

## 2016-10-31 DIAGNOSIS — S52501A Unspecified fracture of the lower end of right radius, initial encounter for closed fracture: Secondary | ICD-10-CM

## 2016-10-31 DIAGNOSIS — W19XXXD Unspecified fall, subsequent encounter: Secondary | ICD-10-CM | POA: Diagnosis not present

## 2016-10-31 MED ORDER — HYDROCODONE-ACETAMINOPHEN 5-325 MG PO TABS: 0.5000 | ORAL_TABLET | Freq: Three times a day (TID) | ORAL | 0 refills | Status: DC | PRN

## 2016-10-31 NOTE — Progress Notes (Signed)
  Subjective: 2 weeks post right distal radius fracture closed reduction, initial postreduction images showed good anatomic reduction, unfortunately has lost reduction over the last 2 weeks.   Objective: General: Well-developed, well-nourished, and in no acute distress. Right wrist: Cast is in good shape, neurovascularly intact distally.  X-rays reviewed and show progressive loss of radial height, with positive ulnar variance, dorsal subluxation.  Assessment/plan:   Fracture of distal radius and ulna, right, closed, initial encounter Unfortunately with loss of reduction and no signs of healing, I do think this needs ORIF. [facility] for consideration of ORIF sooner rather than later.

## 2016-10-31 NOTE — Assessment & Plan Note (Signed)
Unfortunately with loss of reduction and no signs of healing, I do think this needs ORIF. [facility] for consideration of ORIF sooner rather than later.

## 2016-11-01 ENCOUNTER — Ambulatory Visit (HOSPITAL_BASED_OUTPATIENT_CLINIC_OR_DEPARTMENT_OTHER): Payer: BLUE CROSS/BLUE SHIELD | Admitting: Anesthesiology

## 2016-11-01 ENCOUNTER — Encounter (HOSPITAL_BASED_OUTPATIENT_CLINIC_OR_DEPARTMENT_OTHER)
Admission: RE | Disposition: A | Discharge status: Home / Self Care | Payer: Self-pay | Source: Ambulatory Visit | Attending: Orthopedic Surgery

## 2016-11-01 ENCOUNTER — Encounter (HOSPITAL_BASED_OUTPATIENT_CLINIC_OR_DEPARTMENT_OTHER): Payer: Self-pay | Admitting: *Deleted

## 2016-11-01 ENCOUNTER — Ambulatory Visit (HOSPITAL_BASED_OUTPATIENT_CLINIC_OR_DEPARTMENT_OTHER)
Admission: RE | Admit: 2016-11-01 | Discharge: 2016-11-01 | Disposition: A | Discharge status: Home / Self Care | Payer: BLUE CROSS/BLUE SHIELD | Source: Ambulatory Visit | Attending: Orthopedic Surgery | Admitting: Orthopedic Surgery

## 2016-11-01 DIAGNOSIS — J45909 Unspecified asthma, uncomplicated: Secondary | ICD-10-CM | POA: Diagnosis not present

## 2016-11-01 DIAGNOSIS — G5601 Carpal tunnel syndrome, right upper limb: Secondary | ICD-10-CM | POA: Insufficient documentation

## 2016-11-01 DIAGNOSIS — X58XXXD Exposure to other specified factors, subsequent encounter: Secondary | ICD-10-CM | POA: Insufficient documentation

## 2016-11-01 DIAGNOSIS — S52501A Unspecified fracture of the lower end of right radius, initial encounter for closed fracture: Secondary | ICD-10-CM | POA: Diagnosis not present

## 2016-11-01 DIAGNOSIS — M1990 Unspecified osteoarthritis, unspecified site: Secondary | ICD-10-CM | POA: Insufficient documentation

## 2016-11-01 DIAGNOSIS — G8918 Other acute postprocedural pain: Secondary | ICD-10-CM | POA: Diagnosis not present

## 2016-11-01 DIAGNOSIS — F17210 Nicotine dependence, cigarettes, uncomplicated: Secondary | ICD-10-CM | POA: Diagnosis not present

## 2016-11-01 DIAGNOSIS — K219 Gastro-esophageal reflux disease without esophagitis: Secondary | ICD-10-CM | POA: Diagnosis not present

## 2016-11-01 DIAGNOSIS — S52591P Other fractures of lower end of right radius, subsequent encounter for closed fracture with malunion: Secondary | ICD-10-CM | POA: Insufficient documentation

## 2016-11-01 DIAGNOSIS — F329 Major depressive disorder, single episode, unspecified: Secondary | ICD-10-CM | POA: Insufficient documentation

## 2016-11-01 HISTORY — DX: Major depressive disorder, single episode, unspecified: F32.9

## 2016-11-01 HISTORY — PX: ORIF WRIST FRACTURE: SHX2133

## 2016-11-01 HISTORY — DX: Gastro-esophageal reflux disease without esophagitis: K21.9

## 2016-11-01 HISTORY — DX: Unspecified asthma, uncomplicated: J45.909

## 2016-11-01 HISTORY — DX: Unspecified osteoarthritis, unspecified site: M19.90

## 2016-11-01 HISTORY — DX: Depression, unspecified: F32.A

## 2016-11-01 HISTORY — PX: CARPAL TUNNEL RELEASE: SHX101

## 2016-11-01 SURGERY — OPEN REDUCTION INTERNAL FIXATION (ORIF) WRIST FRACTURE
Anesthesia: Regional | Site: Wrist | Laterality: Right

## 2016-11-01 MED ORDER — OXYCODONE-ACETAMINOPHEN 5-325 MG PO TABS: 1.0000 | ORAL_TABLET | Freq: Four times a day (QID) | ORAL | 0 refills | Status: DC | PRN

## 2016-11-01 MED ORDER — FENTANYL CITRATE (PF) 100 MCG/2ML IJ SOLN: 25.0000 ug | INTRAMUSCULAR | Status: DC | PRN

## 2016-11-01 MED ORDER — FENTANYL CITRATE (PF) 100 MCG/2ML IJ SOLN
50.0000 ug | INTRAMUSCULAR | Status: DC | PRN
  Administered 2016-11-01: 50 ug via INTRAVENOUS

## 2016-11-01 MED ORDER — LACTATED RINGERS IV SOLN
INTRAVENOUS | Status: DC
  Administered 2016-11-01: 13:00:00 via INTRAVENOUS
  Administered 2016-11-01: 10 mL/h via INTRAVENOUS

## 2016-11-01 MED ORDER — CEFAZOLIN SODIUM-DEXTROSE 2-4 GM/100ML-% IV SOLN
INTRAVENOUS | Status: AC
  Filled 2016-11-01: qty 100

## 2016-11-01 MED ORDER — MIDAZOLAM HCL 2 MG/2ML IJ SOLN
1.0000 mg | INTRAMUSCULAR | Status: DC | PRN
  Administered 2016-11-01: 1 mg via INTRAVENOUS

## 2016-11-01 MED ORDER — PROPOFOL 10 MG/ML IV BOLUS
INTRAVENOUS | Status: DC | PRN
  Administered 2016-11-01: 150 mg via INTRAVENOUS

## 2016-11-01 MED ORDER — LIDOCAINE 2% (20 MG/ML) 5 ML SYRINGE
INTRAMUSCULAR | Status: AC
  Filled 2016-11-01: qty 5

## 2016-11-01 MED ORDER — CEFAZOLIN SODIUM-DEXTROSE 2-4 GM/100ML-% IV SOLN
2.0000 g | INTRAVENOUS | Status: AC
  Administered 2016-11-01: 2 g via INTRAVENOUS

## 2016-11-01 MED ORDER — OXYCODONE HCL 5 MG/5ML PO SOLN: 5.0000 mg | Freq: Once | ORAL | Status: DC | PRN

## 2016-11-01 MED ORDER — DEXAMETHASONE SODIUM PHOSPHATE 4 MG/ML IJ SOLN
INTRAMUSCULAR | Status: DC | PRN
  Administered 2016-11-01: 10 mg via INTRAVENOUS

## 2016-11-01 MED ORDER — MIDAZOLAM HCL 2 MG/2ML IJ SOLN
INTRAMUSCULAR | Status: AC
  Filled 2016-11-01: qty 2

## 2016-11-01 MED ORDER — ROPIVACAINE HCL 5 MG/ML IJ SOLN
INTRAMUSCULAR | Status: DC | PRN
  Administered 2016-11-01: 30 mL via PERINEURAL

## 2016-11-01 MED ORDER — FENTANYL CITRATE (PF) 100 MCG/2ML IJ SOLN
INTRAMUSCULAR | Status: AC
  Filled 2016-11-01: qty 2

## 2016-11-01 MED ORDER — PROPOFOL 10 MG/ML IV BOLUS
INTRAVENOUS | Status: AC
  Filled 2016-11-01: qty 20

## 2016-11-01 MED ORDER — CHLORHEXIDINE GLUCONATE 4 % EX LIQD: 60.0000 mL | Freq: Once | CUTANEOUS | Status: DC

## 2016-11-01 MED ORDER — CEFAZOLIN IN D5W 1 GM/50ML IV SOLN
1.0000 g | Freq: Once | INTRAVENOUS | Status: AC
  Administered 2016-11-01: 1 g via INTRAVENOUS

## 2016-11-01 MED ORDER — LIDOCAINE 2% (20 MG/ML) 5 ML SYRINGE
INTRAMUSCULAR | Status: DC | PRN
  Administered 2016-11-01: 60 mg via INTRAVENOUS

## 2016-11-01 MED ORDER — OXYCODONE HCL 5 MG PO TABS: 5.0000 mg | ORAL_TABLET | Freq: Once | ORAL | Status: DC | PRN

## 2016-11-01 MED ORDER — 0.9 % SODIUM CHLORIDE (POUR BTL) OPTIME
TOPICAL | Status: DC | PRN
  Administered 2016-11-01: 120 mL

## 2016-11-01 MED ORDER — DEXAMETHASONE SODIUM PHOSPHATE 10 MG/ML IJ SOLN
INTRAMUSCULAR | Status: AC
  Filled 2016-11-01: qty 1

## 2016-11-01 MED ORDER — ONDANSETRON HCL 4 MG/2ML IJ SOLN
INTRAMUSCULAR | Status: DC | PRN
  Administered 2016-11-01: 4 mg via INTRAVENOUS

## 2016-11-01 MED ORDER — EPHEDRINE SULFATE 50 MG/ML IJ SOLN
INTRAMUSCULAR | Status: DC | PRN
  Administered 2016-11-01 (×3): 10 mg via INTRAVENOUS

## 2016-11-01 MED ORDER — ONDANSETRON HCL 4 MG/2ML IJ SOLN: 4.0000 mg | Freq: Four times a day (QID) | INTRAMUSCULAR | Status: DC | PRN

## 2016-11-01 MED ORDER — PHENYLEPHRINE HCL 10 MG/ML IJ SOLN
INTRAMUSCULAR | Status: DC | PRN
  Administered 2016-11-01 (×2): 80 ug via INTRAVENOUS

## 2016-11-01 MED ORDER — CEFAZOLIN IN D5W 1 GM/50ML IV SOLN
INTRAVENOUS | Status: AC
  Filled 2016-11-01: qty 50

## 2016-11-01 MED ORDER — ONDANSETRON HCL 4 MG/2ML IJ SOLN
INTRAMUSCULAR | Status: AC
  Filled 2016-11-01: qty 2

## 2016-11-01 SURGICAL SUPPLY — 78 items
BAG DECANTER FOR FLEXI CONT (MISCELLANEOUS) IMPLANT
BANDAGE ACE 3X5.8 VEL STRL LF (GAUZE/BANDAGES/DRESSINGS) ×1 IMPLANT
BIT DRILL 2 FAST STEP (BIT) ×1 IMPLANT
BIT DRILL 2.5X4 QC (BIT) ×1 IMPLANT
BLADE CLIPPER SURG (BLADE) IMPLANT
BLADE SURG 15 STRL LF DISP TIS (BLADE) ×2 IMPLANT
BLADE SURG 15 STRL SS (BLADE) ×4
BNDG CMPR 9X4 STRL LF SNTH (GAUZE/BANDAGES/DRESSINGS) ×1
BNDG ELASTIC 2X5.8 VLCR STR LF (GAUZE/BANDAGES/DRESSINGS) ×1 IMPLANT
BNDG ESMARK 4X9 LF (GAUZE/BANDAGES/DRESSINGS) ×1 IMPLANT
BONE CHIP PRESERV 5CC PCAN5 (Bone Implant) ×2 IMPLANT
CANISTER SUCT 1200ML W/VALVE (MISCELLANEOUS) ×1 IMPLANT
COVER BACK TABLE 60X90IN (DRAPES) ×2 IMPLANT
COVER MAYO STAND STRL (DRAPES) ×2 IMPLANT
CUFF TOURNIQUET SINGLE 18IN (TOURNIQUET CUFF) ×1 IMPLANT
DECANTER SPIKE VIAL GLASS SM (MISCELLANEOUS) IMPLANT
DRAPE EXTREMITY T 121X128X90 (DRAPE) ×2 IMPLANT
DRAPE OEC MINIVIEW 54X84 (DRAPES) ×2 IMPLANT
DRAPE SURG 17X23 STRL (DRAPES) ×2 IMPLANT
DRSG EMULSION OIL 3X3 NADH (GAUZE/BANDAGES/DRESSINGS) ×1 IMPLANT
DURAPREP 26ML APPLICATOR (WOUND CARE) ×2 IMPLANT
ELECT REM PT RETURN 9FT ADLT (ELECTROSURGICAL) ×2
ELECTRODE REM PT RTRN 9FT ADLT (ELECTROSURGICAL) ×1 IMPLANT
GAUZE SPONGE 4X4 12PLY STRL (GAUZE/BANDAGES/DRESSINGS) ×2 IMPLANT
GLOVE BIO SURGEON STRL SZ7.5 (GLOVE) ×1 IMPLANT
GLOVE BIOGEL PI IND STRL 8 (GLOVE) ×2 IMPLANT
GLOVE BIOGEL PI INDICATOR 8 (GLOVE) ×3
GLOVE ECLIPSE 7.5 STRL STRAW (GLOVE) ×4 IMPLANT
GLOVE SURG SYN 7.5  E (GLOVE) ×1
GLOVE SURG SYN 7.5 E (GLOVE) ×1 IMPLANT
GLOVE SURG SYN 7.5 PF PI (GLOVE) IMPLANT
GOWN STRL REUS W/ TWL LRG LVL3 (GOWN DISPOSABLE) ×1 IMPLANT
GOWN STRL REUS W/ TWL XL LVL3 (GOWN DISPOSABLE) ×1 IMPLANT
GOWN STRL REUS W/TWL LRG LVL3 (GOWN DISPOSABLE) ×2
GOWN STRL REUS W/TWL XL LVL3 (GOWN DISPOSABLE) ×4 IMPLANT
GRAFT BNE CANC CHIPS 1-8 5CC (Bone Implant) IMPLANT
K-WIRE 1.6 (WIRE) ×4
K-WIRE FX5X1.6XNS BN SS (WIRE) ×2
KWIRE FX5X1.6XNS BN SS (WIRE) IMPLANT
NEEDLE HYPO 22GX1.5 SAFETY (NEEDLE) IMPLANT
NS IRRIG 1000ML POUR BTL (IV SOLUTION) ×1 IMPLANT
PACK BASIN DAY SURGERY FS (CUSTOM PROCEDURE TRAY) ×2 IMPLANT
PAD CAST 3X4 CTTN HI CHSV (CAST SUPPLIES) ×1 IMPLANT
PADDING CAST ABS 4INX4YD NS (CAST SUPPLIES) ×1
PADDING CAST ABS COTTON 4X4 ST (CAST SUPPLIES) ×1 IMPLANT
PADDING CAST COTTON 3X4 STRL (CAST SUPPLIES) ×2
PADDING UNDERCAST 2 STRL (CAST SUPPLIES) ×1
PADDING UNDERCAST 2X4 STRL (CAST SUPPLIES) IMPLANT
PEG SUBCHONDRAL SMOOTH 2.0X18 (Peg) ×5 IMPLANT
PEG SUBCHONDRAL SMOOTH 2.0X20 (Peg) ×1 IMPLANT
PEG SUBCHONDRAL SMOOTH 2.0X22 (Peg) ×2 IMPLANT
PEG SUBCHONDRAL SMOOTH 2.0X24 (Peg) ×1 IMPLANT
PENCIL BUTTON HOLSTER BLD 10FT (ELECTRODE) ×2 IMPLANT
PLATE STAN 24.4X59.5 RT (Plate) ×1 IMPLANT
SCREW BN 12X3.5XNS CORT TI (Screw) IMPLANT
SCREW CORT 3.5X10 LNG (Screw) ×2 IMPLANT
SCREW CORT 3.5X12 (Screw) ×4 IMPLANT
SCREW CORT 3.5X16 LNG (Screw) ×1 IMPLANT
SLING ARM FOAM STRAP MED (SOFTGOODS) ×1 IMPLANT
SPLINT PLASTER CAST XFAST 3X15 (CAST SUPPLIES) IMPLANT
SPLINT PLASTER XTRA FASTSET 3X (CAST SUPPLIES) ×10
STOCKINETTE 4X48 STRL (DRAPES) ×2 IMPLANT
STRIP CLOSURE SKIN 1/2X4 (GAUZE/BANDAGES/DRESSINGS) IMPLANT
SUCTION FRAZIER HANDLE 10FR (MISCELLANEOUS) ×1
SUCTION TUBE FRAZIER 10FR DISP (MISCELLANEOUS) IMPLANT
SUT BONE WAX W31G (SUTURE) IMPLANT
SUT MNCRL AB 3-0 PS2 18 (SUTURE) ×2 IMPLANT
SUT VIC AB 1 CT1 27 (SUTURE)
SUT VIC AB 1 CT1 27XBRD ANBCTR (SUTURE) IMPLANT
SUT VIC AB 3-0 SH 27 (SUTURE) ×2
SUT VIC AB 3-0 SH 27X BRD (SUTURE) IMPLANT
SUT VICRYL 4-0 PS2 18IN ABS (SUTURE) ×2 IMPLANT
SYR BULB 3OZ (MISCELLANEOUS) ×2 IMPLANT
SYR CONTROL 10ML LL (SYRINGE) IMPLANT
TOWEL OR 17X24 6PK STRL BLUE (TOWEL DISPOSABLE) ×2 IMPLANT
TOWEL OR NON WOVEN STRL DISP B (DISPOSABLE) ×2 IMPLANT
TUBE CONNECTING 20X1/4 (TUBING) ×1 IMPLANT
UNDERPAD 30X30 (UNDERPADS AND DIAPERS) ×2 IMPLANT

## 2016-11-01 NOTE — Anesthesia Postprocedure Evaluation (Signed)
Anesthesia Post Note  Patient: Yves DillWendy P Schumpert  Procedure(s) Performed: Procedure(s) (LRB): OPEN REDUCTION INTERNAL FIXATION (ORIF) WRIST FRACTURE (Right) CARPAL TUNNEL RELEASE (Right)  Patient location during evaluation: PACU Anesthesia Type: General and Regional Level of consciousness: awake, awake and alert and oriented Pain management: pain level controlled Vital Signs Assessment: post-procedure vital signs reviewed and stable Respiratory status: spontaneous breathing, nonlabored ventilation and respiratory function stable Cardiovascular status: blood pressure returned to baseline Anesthetic complications: no    Last Vitals:  Vitals:   11/01/16 1445 11/01/16 1500  BP: 122/66 136/64  Pulse: 84 85  Resp: (!) 22 14  Temp:      Last Pain:  Vitals:   11/01/16 1430  TempSrc:   PainSc: 0-No pain                 Kalla Watson COKER

## 2016-11-01 NOTE — Brief Op Note (Signed)
11/01/2016  1:45 PM  PATIENT:  Glenda Fernandez  55 y.o. female  PRE-OPERATIVE DIAGNOSIS:  DISPLACED RIGHT DISTAL RADIUS FRACTURE  POST-OPERATIVE DIAGNOSIS:  DISPLACED RIGHT DISTAL RADIUS FRACTURE  PROCEDURE:  Procedure(s): OPEN REDUCTION INTERNAL FIXATION (ORIF) WRIST FRACTURE (Right)  SURGEON:  Surgeon(s) and Role:    * Jodi GeraldsJohn Maxxon Schwanke, MD - Primary  PHYSICIAN ASSISTANT:   ASSISTANTS: bethune   ANESTHESIA:   general  EBL:  Total I/O In: 1000 [I.V.:1000] Out: -   BLOOD ADMINISTERED:none  DRAINS: none   LOCAL MEDICATIONS USED:  MARCAINE     SPECIMEN:  No Specimen  DISPOSITION OF SPECIMEN:  N/A  COUNTS:  YES  TOURNIQUET:  * Missing tourniquet times found for documented tourniquets in log:  [id number] *  DICTATION: .Other Dictation: Dictation Number none given  PLAN OF CARE: Discharge to home after PACU  PATIENT DISPOSITION:  PACU - hemodynamically stable.   Delay start of Pharmacological VTE agent (>24hrs) due to surgical blood loss or risk of bleeding: no

## 2016-11-01 NOTE — Anesthesia Preprocedure Evaluation (Signed)
Anesthesia Evaluation  Patient identified by MRN, date of birth, ID band Patient awake    Reviewed: Allergy & Precautions, H&P , NPO status , Patient's Chart, lab work & pertinent test results  Airway Mallampati: II   Neck ROM: full    Dental   Pulmonary asthma , Current Smoker,    breath sounds clear to auscultation       Cardiovascular negative cardio ROS   Rhythm:regular Rate:Normal     Neuro/Psych PSYCHIATRIC DISORDERS Depression  Neuromuscular disease    GI/Hepatic GERD  ,  Endo/Other    Renal/GU      Musculoskeletal  (+) Arthritis ,   Abdominal   Peds  Hematology   Anesthesia Other Findings   Reproductive/Obstetrics                             Anesthesia Physical Anesthesia Plan  ASA: II  Anesthesia Plan: General and Regional   Post-op Pain Management:  Regional for Post-op pain   Induction: Intravenous  Airway Management Planned: LMA  Additional Equipment:   Intra-op Plan:   Post-operative Plan:   Informed Consent: I have reviewed the patients History and Physical, chart, labs and discussed the procedure including the risks, benefits and alternatives for the proposed anesthesia with the patient or authorized representative who has indicated his/her understanding and acceptance.     Plan Discussed with: CRNA, Anesthesiologist and Surgeon  Anesthesia Plan Comments:         Anesthesia Quick Evaluation

## 2016-11-01 NOTE — Anesthesia Procedure Notes (Addendum)
Anesthesia Regional Block:  Supraclavicular block  Pre-Anesthetic Checklist: ,, timeout performed, Correct Patient, Correct Site, Correct Laterality, Correct Procedure, Correct Position, site marked, Risks and benefits discussed,  Surgical consent,  Pre-op evaluation,  At surgeon's request and post-op pain management  Laterality: Right  Prep: chloraprep       Needles:  Injection technique: Single-shot  Needle Type: Echogenic Stimulator Needle     Needle Length: 5cm 5 cm Needle Gauge: 22 and 22 G    Additional Needles:  Procedures: ultrasound guided (picture in chart) and nerve stimulator Supraclavicular block  Nerve Stimulator or Paresthesia:  Response: biceps flexion, 0.45 mA,   Additional Responses:   Narrative:  Start time: 11/01/2016 11:54 AM End time: 11/01/2016 12:04 PM Injection made incrementally with aspirations every 5 mL.  Performed by: Personally  Anesthesiologist: Chantee Cerino  Additional Notes: Functioning IV was confirmed and monitors were applied.  A 50mm 22ga Arrow echogenic stimulator needle was used. Sterile prep and drape,hand hygiene and sterile gloves were used.  Negative aspiration and negative test dose prior to incremental administration of local anesthetic. The patient tolerated the procedure well.  Ultrasound guidance: relevent anatomy identified, needle position confirmed, local anesthetic spread visualized around nerve(s), vascular puncture avoided.  Image printed for medical record.

## 2016-11-01 NOTE — Anesthesia Procedure Notes (Signed)
Procedure Name: LMA Insertion Date/Time: 11/01/2016 12:17 PM Performed by: Gar GibbonKEETON, Raedyn Klinck S Pre-anesthesia Checklist: Patient identified, Emergency Drugs available, Suction available and Patient being monitored Patient Re-evaluated:Patient Re-evaluated prior to inductionOxygen Delivery Method: Circle system utilized Preoxygenation: Pre-oxygenation with 100% oxygen Intubation Type: IV induction Ventilation: Mask ventilation without difficulty LMA: LMA inserted LMA Size: 3.0 Number of attempts: 1 Airway Equipment and Method: Bite block Placement Confirmation: positive ETCO2 Tube secured with: Tape Dental Injury: Teeth and Oropharynx as per pre-operative assessment

## 2016-11-01 NOTE — H&P (Signed)
PREOPERATIVE H&P  Chief Complaint: r wrist pain 3 weeks afetr distal radius fracture  HPI: Glenda Fernandez is a 55 y.o. 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 It has been present for 3 weeks and has been worsening.  She initially had a nondisplace distal radius fracture treated in a cast which collapsed.  We evaluated her and felt she needed an oopen reduction and internal fixation to restore anatomy. She has failed conservative measures. Pain is rated as moderate.  No past medical history on file. Past Surgical History:  Procedure Laterality Date  . BACK SURGERY  2001  . BLADDER SUSPENSION    . BREAST ENHANCEMENT SURGERY Left 1879  . HAND SURGERY Left 2013   for fracture, [facility] [facility]  . NECK SURGERY    . SHOULDER SURGERY  2005   Social History   Social History  . Marital status: Married    Spouse name: Molly MaduroRobert  . Number of children: 2  . Years of education: N/A   Occupational History  . Evidence Tech     [place]   Social History Main Topics  . Smoking status: Current Every Day Smoker    Packs/day: 1.00  . Smokeless tobacco: Not on file  . Alcohol use Not on file  . Drug use: Unknown  . Sexual activity: Not on file   Other Topics Concern  . Not on file   Social History Narrative   Some college.  3 caffeine per day.  Walks 3 times per week for exercise.    Family History  Problem Relation Age of Onset  . Coronary artery disease Father   . Lung cancer Father     mesothelioma  . Hyperlipidemia Father   . Alcohol abuse      uncle  . Breast cancer Mother   . Kidney failure Mother   . Hyperlipidemia Mother   . Bipolar disorder Daughter   . Depression Daughter    No Known Allergies Prior to Admission medications   Medication Sig Start Date End Date Taking? Authorizing Provider  FLUoxetine (PROZAC) 40 MG capsule Take 2 capsules (80 mg total) by mouth daily. 08/07/16   Agapito Gamesatherine D Metheney, MD  HYDROcodone-acetaminophen (NORCO/VICODIN) 5-325 MG  tablet Take 0.5 tablets by mouth every 8 (eight) hours as needed for moderate pain. 10/31/16   Monica Bectonhomas J Thekkekandam, MD  lisdexamfetamine (VYVANSE) 70 MG capsule Take 1 capsule (70 mg total) by mouth every morning. Ok to fill 11/05/2016 09/06/16   Agapito Gamesatherine D Metheney, MD  lovastatin (MEVACOR) 40 MG tablet Take 1 tablet (40 mg total) by mouth at bedtime. 02/22/15   Agapito Gamesatherine D Metheney, MD     Positive ROS: none  All other systems have been reviewed and were otherwise negative with the exception of those mentioned in the HPI and as above.  Physical Exam: There were no vitals filed for this visit.  General: Alert, no acute distress Cardiovascular: No pedal edema Respiratory: No cyanosis, no use of accessory musculature GI: No organomegaly, abdomen is soft and non-tender Skin: No lesions in the area of chief complaint Neurologic: Sensation intact distally Psychiatric: Patient is competent for consent with normal mood and affect Lymphatic: No axillary or cervical lymphadenopathy  MUSCULOSKELETAL: r wrist: sts and painful ROM radial deviation deformity  XRAY: shortening and dorsal angulation  Assessment/Plan: DISPLACED RIGHT DISTAL RADIUS FRACTURE Plan for Procedure(s): OPEN REDUCTION INTERNAL FIXATION (ORIF) WRIST FRACTURE possible CTR  The risks benefits and alternatives were discussed with the patient  including but not limited to the risks of nonoperative treatment, versus surgical intervention including infection, bleeding, nerve injury, malunion, nonunion, hardware prominence, hardware failure, need for hardware removal, blood clots, cardiopulmonary complications, morbidity, mortality, among others, and they were willing to proceed.  Predicted outcome is good, although there will be at least a six to nine month expected recovery.  Kramer Hanrahan L, MD 11/01/2016 10:53 AM

## 2016-11-01 NOTE — Progress Notes (Signed)
Assisted Dr. Hodierne with right, ultrasound guided, supraclavicular block. Side rails up, monitors on throughout procedure. See vital signs in flow sheet. Tolerated Procedure well.  

## 2016-11-01 NOTE — Transfer of Care (Signed)
Immediate Anesthesia Transfer of Care Note  Patient: Yves DillWendy P Mallon  Procedure(s) Performed: Procedure(s): OPEN REDUCTION INTERNAL FIXATION (ORIF) WRIST FRACTURE (Right) CARPAL TUNNEL RELEASE (Right)  Patient Location: PACU  Anesthesia Type:GA combined with regional for post-op pain  Level of Consciousness: awake, sedated and responds to stimulation  Airway & Oxygen Therapy: Patient Spontanous Breathing and Patient connected to face mask oxygen  Post-op Assessment: Report given to RN, Post -op Vital signs reviewed and stable and Patient moving all extremities  Post vital signs: Reviewed and stable  Last Vitals:  Vitals:   11/01/16 1145 11/01/16 1200  BP: (!) 130/59 (!) 141/72  Pulse: 75 74  Resp: (!) 9 14  Temp:      Last Pain:  Vitals:   11/01/16 1115  TempSrc: Oral  PainSc: 0-No pain         Complications: No apparent anesthesia complications

## 2016-11-01 NOTE — Discharge Instructions (Signed)
Ice and elevate your right hand as much as possible. Move your fingers as tolerated. Keep dry. HAPPY THANKSGIVING!  Regional Anesthesia Blocks  1. Numbness or the inability to move the "blocked" extremity may last from 3-48 hours after placement. The length of time depends on the medication injected and your individual response to the medication. If the numbness is not going away after 48 hours, call your surgeon.  2. The extremity that is blocked will need to be protected until the numbness is gone and the  Strength has returned. Because you cannot feel it, you will need to take extra care to avoid injury. Because it may be weak, you may have difficulty moving it or using it. You may not know what position it is in without looking at it while the block is in effect.  3. For blocks in the legs and feet, returning to weight bearing and walking needs to be done carefully. You will need to wait until the numbness is entirely gone and the strength has returned. You should be able to move your leg and foot normally before you try and bear weight or walk. You will need someone to be with you when you first try to ensure you do not fall and possibly risk injury.  4. Bruising and tenderness at the needle site are common side effects and will resolve in a few days.  5. Persistent numbness or new problems with movement should be communicated to the surgeon or the [facility] [phone])/ [facility] [phone]).  Post Anesthesia Home Care Instructions  Activity: Get plenty of rest for the remainder of the day. A responsible adult should stay with you for 24 hours following the procedure.  For the next 24 hours, DO NOT: -Drive a car -Advertising copywriterperate machinery -Drink alcoholic beverages -Take any medication unless instructed by your physician -Make any legal decisions or sign important papers.  Meals: Start with liquid foods such as gelatin or soup. Progress to regular foods  as tolerated. Avoid greasy, spicy, heavy foods. If nausea and/or vomiting occur, drink only clear liquids until the nausea and/or vomiting subsides. Call your physician if vomiting continues.  Special Instructions/Symptoms: Your throat may feel dry or sore from the anesthesia or the breathing tube placed in your throat during surgery. If this causes discomfort, gargle with warm salt water. The discomfort should disappear within 24 hours.  If you had a scopolamine patch placed behind your ear for the management of post- operative nausea and/or vomiting:  1. The medication in the patch is effective for 72 hours, after which it should be removed.  Wrap patch in a tissue and discard in the trash. Wash hands thoroughly with soap and water. 2. You may remove the patch earlier than 72 hours if you experience unpleasant side effects which may include dry mouth, dizziness or visual disturbances. 3. Avoid touching the patch. Wash your hands with soap and water after contact with the patch.

## 2016-11-04 ENCOUNTER — Encounter (HOSPITAL_BASED_OUTPATIENT_CLINIC_OR_DEPARTMENT_OTHER): Payer: Self-pay | Admitting: Orthopedic Surgery

## 2016-11-04 NOTE — Op Note (Signed)
Glenda Fernandez:  Hice, Glenda Fernandez                 ACCOUNT NO.:  [id number]  MEDICAL RECORD NO.:  [id number]  LOCATION:                                 FACILITY:  PHYSICIAN:  Glenda Fernandez, M.D.   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  PROCEDURES: 1. Takedown of malunited distal radius with open reduction and     internal fixation with cancellous bone graft, allograft. 2. Carpal tunnel release. 3. Interpretation of multiple intraoperative fluoroscopic images.  SURGEON:  Glenda JuniorJohn L. Aijalon Demuro, M.D.  ASSISTANT:  Marshia LyJames Bethune, P.A.  ANESTHESIA:  General.  BRIEF HISTORY:  Glenda Fernandez is a 55 year old female with a long history of significant complaints of right wrist pain.  She had been treated with an initial closed reduction and casting, initially her reduction looked adequate, but unfortunately over 3 weeks, fell into a malunited position.  We were saw her in the office and certainly, we were concerned about the malunited position and took her urgently to the operating room the next day for takedown and fixation, understanding that this is going to need to be done as quickly as possible because she was continuing to heal.  She was brought to the operating room for this procedure.  DESCRIPTION OF PROCEDURE:  The patient was brought to the operating room.  After adequate anesthesia was obtained with general anesthetic, the patient was placed supine on the operating table, and the right arm was prepped and draped in usual sterile fashion.  Following this, the arm was exsanguinated, blood pressure tourniquet was inflated to 250 mmHg.  Following this, an incision was made over the flexor carpi radialis tendon, subcutaneous tissue down to the tendon, the  sheath was opened.  Tendon was retracted radially and the floor of the sheath was exploited down to the anatomy of the distal radius.  The flexor pollicis longus was identified followed by the pronator quadratus.  The pronator was released on the radial side and the distal radius was identified. The fracture site was identified, it was essentially healed.  At that point, we had to get a rongeur and had to get some osteotomes and mallets and basically start to work our way into taking down this malunion of the radius.  Once we were able to do so, we put a lamina spreader in, held it in a radially deviated position and an inclined position and then took significant amount of bone grafting and put that more dorsally and more radially and then once we were satisfied that the bone graft was adequately positioned, we put our plate on dead center in the wrist and then with the wrist held in a manipulative closed position with the radial length restored and the inclination restored, essentially just past neutral, we put our distal pegs in.  At that point, the pegs were placed and the fracture was held in an anatomically reduced position or essentially so.  Once this was done, we were  certain as none of the pegs were in the joint.  We put our proximal screws and finished that row.  Fluoro was used to make sure the screw lengths were appropriately positioned and length.  At that point, the final fluoroscopic images were taken.  At this point, the wrist was irrigated, suctioned dry, closed the pronator quadratus over the plate with a couple of 4-0 Vicryl interrupted and then the skin with 2-0 Vicryl and 3- 0 Monocryl subcuticular.  Benzoin and Steri-Strips were applied. Sterile compressive dressing was applied as well as a volar plaster and the patient was taken to the recovery, she was noted to be in satisfactory condition.  Estimated blood loss for procedure was minimal.     Glenda JuniorJohn L. Aliveah Gallant,  M.D.   ______________________________ Glenda JuniorJohn L. Emmamae Mcnamara, M.D.    Ranae PlumberJLG/MEDQ  D:  11/01/2016  T:  11/02/2016  Job:  [id number]

## 2016-11-12 DIAGNOSIS — S52591P Other fractures of lower end of right radius, subsequent encounter for closed fracture with malunion: Secondary | ICD-10-CM | POA: Diagnosis not present

## 2016-11-28 DIAGNOSIS — S52531P Colles' fracture of right radius, subsequent encounter for closed fracture with malunion: Secondary | ICD-10-CM | POA: Diagnosis not present

## 2016-12-02 ENCOUNTER — Encounter: Payer: Self-pay | Admitting: Family Medicine

## 2016-12-02 ENCOUNTER — Ambulatory Visit (INDEPENDENT_AMBULATORY_CARE_PROVIDER_SITE_OTHER): Payer: BLUE CROSS/BLUE SHIELD | Admitting: Family Medicine

## 2016-12-02 VITALS — BP 148/69 | HR 100 | Wt 143.0 lb

## 2016-12-02 DIAGNOSIS — F902 Attention-deficit hyperactivity disorder, combined type: Secondary | ICD-10-CM

## 2016-12-02 DIAGNOSIS — R030 Elevated blood-pressure reading, without diagnosis of hypertension: Secondary | ICD-10-CM

## 2016-12-02 DIAGNOSIS — Z4889 Encounter for other specified surgical aftercare: Secondary | ICD-10-CM

## 2016-12-02 MED ORDER — LISDEXAMFETAMINE DIMESYLATE 70 MG PO CAPS: 70.0000 mg | ORAL_CAPSULE | ORAL | 0 refills | Status: DC

## 2016-12-02 NOTE — Progress Notes (Signed)
Subjective:    CC: ADHD  HPI:  ADHD - She is doing well on the medication without any side effects. No chest pain shortness of breath or palpitations. She is happy with her current regimen. She does not take it every day.  She did have surgery on her right wrist. She thinks some of the dissolvable sutures are coming to the surface and wants me to look at it. No pain or redness or fever.  Past medical history, Surgical history, Family history not pertinant except as noted below, Social history, Allergies, and medications have been entered into the medical record, reviewed, and corrections made.   Review of Systems: No fevers, chills, night sweats, weight loss, chest pain, or shortness of breath.   Objective:    General: Well Developed, well nourished, and in no acute distress.  Neuro: Alert and oriented x3, extra-ocular muscles intact, sensation grossly intact.  HEENT: Normocephalic, atraumatic  Skin: Warm and dry, no rashes.Well-healed on her right anterior wrist. She does have 2 areas we can see almost a darker black colored suture coming to the surface. Cardiac: Regular rate and rhythm, no murmurs rubs or gallops, no lower extremity edema.  Respiratory: Clear to auscultation bilaterally. Not using accessory muscles, speaking in full sentences.   Impression and Recommendations:    ADHD - I am concerned that her medication could be raising her blood pressure. When I look back over her medical history sometimes when she consider blood pressure looks actually perfect and fantastic and then sometimes it's quite high in the 150s and 160s similar to today's. Pressure. She did take her medication today. We'll recheck that first. If it's still elevated but I like her to come back in about 2 weeks when she's not on the medication to check her pressure to see if this may be causing her symptoms.  She does have 2 small sutures that are coming to the surface. Just recommended that she let it be. Not  to pick at them. Apply Vaseline twice a day to help moisturize the area.  Elevated BP without dx of HTN - See note above.

## 2016-12-05 ENCOUNTER — Telehealth: Payer: Self-pay | Admitting: *Deleted

## 2016-12-05 MED ORDER — AMBULATORY NON FORMULARY MEDICATION: 0 refills | Status: DC

## 2016-12-05 NOTE — Telephone Encounter (Signed)
Pt lvm asking if Dr. Linford ArnoldMetheney would write for a bp cuff.   Called pt and informed her that rx was ready.Loralee PacasBarkley, Ailanie Ruttan [place]

## 2016-12-18 ENCOUNTER — Ambulatory Visit: Payer: BLUE CROSS/BLUE SHIELD

## 2016-12-30 ENCOUNTER — Ambulatory Visit (INDEPENDENT_AMBULATORY_CARE_PROVIDER_SITE_OTHER): Payer: BLUE CROSS/BLUE SHIELD | Admitting: Family Medicine

## 2016-12-30 VITALS — BP 154/72 | HR 96 | Temp 98.8°F | Wt 137.0 lb

## 2016-12-30 DIAGNOSIS — J209 Acute bronchitis, unspecified: Secondary | ICD-10-CM | POA: Diagnosis not present

## 2016-12-30 DIAGNOSIS — S5011XA Contusion of right forearm, initial encounter: Secondary | ICD-10-CM | POA: Diagnosis not present

## 2016-12-30 MED ORDER — PREDNISONE 10 MG PO TABS: 30.0000 mg | ORAL_TABLET | Freq: Every day | ORAL | 0 refills | Status: DC

## 2016-12-30 MED ORDER — ALBUTEROL SULFATE HFA 108 (90 BASE) MCG/ACT IN AERS: 2.0000 | INHALATION_SPRAY | Freq: Four times a day (QID) | RESPIRATORY_TRACT | 2 refills | Status: DC | PRN

## 2016-12-30 MED ORDER — BENZONATATE 200 MG PO CAPS: 200.0000 mg | ORAL_CAPSULE | Freq: Three times a day (TID) | ORAL | 0 refills | Status: DC | PRN

## 2016-12-30 MED ORDER — IPRATROPIUM BROMIDE 0.06 % NA SOLN: 2.0000 | NASAL | 6 refills | Status: DC | PRN

## 2016-12-30 NOTE — Patient Instructions (Signed)
Thank you for coming in today. Call or go to the emergency room if you get worse, have trouble breathing, have chest pains, or palpitations.  Schedule with Dr Linford ArnoldMetheney for spirometry in 1-2 months when feeling better.  I recommend quitting smoking.    Acute Bronchitis, Adult Acute bronchitis is when air tubes (bronchi) in the lungs suddenly get swollen. The condition can make it hard to breathe. It can also cause these symptoms:  A cough.  Coughing up clear, yellow, or green mucus.  Wheezing.  Chest congestion.  Shortness of breath.  A fever.  Body aches.  Chills.  A sore throat. Follow these instructions at home: Medicines  Take over-the-counter and prescription medicines only as told by your doctor.  If you were prescribed an antibiotic medicine, take it as told by your doctor. Do not stop taking the antibiotic even if you start to feel better. General instructions  Rest.  Drink enough fluids to keep your pee (urine) clear or pale yellow.  Avoid smoking and secondhand smoke. If you smoke and you need help quitting, ask your doctor. Quitting will help your lungs heal faster.  Use an inhaler, cool mist vaporizer, or humidifier as told by your doctor.  Keep all follow-up visits as told by your doctor. This is important. How is this prevented? To lower your risk of getting this condition again:  Wash your hands often with soap and water. If you cannot use soap and water, use hand sanitizer.  Avoid contact with people who have cold symptoms.  Try not to touch your hands to your mouth, nose, or eyes.  Make sure to get the flu shot every year. Contact a doctor if:  Your symptoms do not get better in 2 weeks. Get help right away if:  You cough up blood.  You have chest pain.  You have very bad shortness of breath.  You become dehydrated.  You faint (pass out) or keep feeling like you are going to pass out.  You keep throwing up (vomiting).  You have a  very bad headache.  Your fever or chills gets worse. This information is not intended to replace advice given to you by your health care provider. Make sure you discuss any questions you have with your health care provider. Document Released: 05/20/2008 Document Revised: 07/10/2016 Document Reviewed: 05/22/2016 Elsevier Interactive Patient Education  2017 [employer]

## 2016-12-30 NOTE — Progress Notes (Signed)
Glenda Fernandez is a 56 y.o. female who [facility] today for wheezing and chest tightness shortness of breath fatigue and productive cough. Symptoms present for 2-1/2 weeks. Patient has used albuterol which helps temporarily. She denies fevers or chest pain or palpitations. She notes her symptoms are consistent with previous episodes of bronchitis. She smokes daily but has never been diagnosed with COPD.  Right arm hematoma: About an hour prior to going to the doctor's office today patient fell and hit her right forearm on a hard object. She has bruising but denies severe pain. She denies any radiating pain weakness or numbness. She notes that she would not have gone to the doctor for this if she were going already. She doesn't do not think that she broke it as she has broken her arm before and it felt different.   Past Medical History:  Diagnosis Date  . Arthritis    osteo  . Asthma   . Depression   . GERD (gastroesophageal reflux disease)    Past Surgical History:  Procedure Laterality Date  . BACK SURGERY  2001  . BLADDER SUSPENSION    . BREAST ENHANCEMENT SURGERY Left 1879  . CARPAL TUNNEL RELEASE Right 11/01/2016   Procedure: CARPAL TUNNEL RELEASE;  Surgeon: Jodi GeraldsJohn Graves, MD;  Location: [facility];  Service: Orthopedics;  Laterality: Right;  . HAND SURGERY Left 2013   for fracture, [place] [facility]  . NECK SURGERY    . ORIF WRIST FRACTURE Right 11/01/2016   Procedure: OPEN REDUCTION INTERNAL FIXATION (ORIF) WRIST FRACTURE;  Surgeon: Jodi GeraldsJohn Graves, MD;  Location: [facility];  Service: Orthopedics;  Laterality: Right;  . SHOULDER SURGERY  2005   Social History  Substance Use Topics  . Smoking status: Current Every Day Smoker    Packs/day: 1.00    Years: 40.00  . Smokeless tobacco: Never Used  . Alcohol use No   family history  includes Bipolar disorder in her daughter; Breast cancer in her mother; Coronary artery disease in her father; Depression in her daughter; Hyperlipidemia in her father and mother; Kidney failure in her mother; Lung cancer in her father.  ROS as above:  Medications: Current Outpatient Prescriptions  Medication Sig Dispense Refill  . albuterol (PROVENTIL HFA;VENTOLIN HFA) 108 (90 Base) MCG/ACT inhaler Inhale 2 puffs into the lungs every 6 (six) hours as needed for wheezing or shortness of breath. 1 Inhaler 2  . AMBULATORY NON FORMULARY MEDICATION Medication Name: blood pressure cuff 1 each 0  . benzonatate (TESSALON) 200 MG capsule Take 1 capsule (200 mg total) by mouth 3 (three) times daily as needed for cough. 45 capsule 0  . FLUoxetine (PROZAC) 40 MG capsule Take 2 capsules (80 mg total) by mouth daily. 180 capsule 1  . ipratropium (ATROVENT) 0.06 % nasal spray Place 2 sprays into both nostrils every 4 (four) hours as needed for rhinitis. 10 mL 6  . lisdexamfetamine (VYVANSE) 70 MG capsule Take 1 capsule (70 mg total) by mouth every morning. Ok to fill 12/05/2016 30 capsule 0  . lovastatin (MEVACOR) 40 MG tablet Take 1 tablet (40 mg total) by mouth at bedtime. 90 tablet 1  . predniSONE (DELTASONE) 10 MG tablet Take 3 tablets (30 mg total) by mouth daily with breakfast. 15 tablet 0  . ranitidine (ZANTAC) 150 MG tablet Take 150 mg by mouth 2 (two) times daily.     No current facility-administered  medications for this visit.    No Known Allergies  Health Maintenance Health Maintenance  Topic Date Due  . Hepatitis C Screening  July 02, 1961  . HIV Screening  05/22/1974  . COLONOSCOPY  01/21/2017 (Originally 05/22/2009)  . MAMMOGRAM  06/12/2018  . PAP SMEAR  07/14/2018  . TETANUS/TDAP  12/16/2018  . INFLUENZA VACCINE  Completed     Exam:  BP (!) 154/72   Pulse 96   Temp 98.8 F (37.1 C) (Oral)   Wt 137 lb (62.1 kg)   BMI 21.46 kg/m  Gen: Well NAD Nontoxic appearing HEENT: EOMI,   MMM Lungs: Normal work of breathing. Coarse breath sounds and wheezing present bilaterally Heart: RRR no MRG Abd: NABS, Soft. Nondistended, Nontender Exts: Brisk capillary refill, warm and well perfused.  MSK: 2 cm soft hematoma present right forearm. Elbow and wrist are nontender with normal elbow and wrist motion and strength.  No results found for this or any previous visit (from the past 72 hour(s)). No results found.    Assessment and Plan: 56 y.o. female with  Cough likely due to bronchitis versus COPD exacerbation. Patient is a current daily smoker. Treat with prednisone and azithromycin as well as albuterol and Atrovent nasal spray. Recommend patient follow-up with her primary care provider in a month or 2 for spirometry to evaluate for COPD this patient has had recurrent episodes of bronchitis now.  Right arm hematoma: Doubtful for fracture. Patient declines x-ray. Plan for watchful waiting.   No orders of the defined types were placed in this encounter.   Discussed warning signs or symptoms. Please see discharge instructions. Patient expresses understanding.

## 2016-12-31 ENCOUNTER — Ambulatory Visit: Payer: BLUE CROSS/BLUE SHIELD

## 2017-01-02 ENCOUNTER — Ambulatory Visit: Payer: BLUE CROSS/BLUE SHIELD

## 2017-01-03 ENCOUNTER — Ambulatory Visit (INDEPENDENT_AMBULATORY_CARE_PROVIDER_SITE_OTHER): Payer: BLUE CROSS/BLUE SHIELD | Admitting: Family Medicine

## 2017-01-03 VITALS — BP 152/66 | HR 81 | Wt 136.0 lb

## 2017-01-03 DIAGNOSIS — I10 Essential (primary) hypertension: Secondary | ICD-10-CM

## 2017-01-03 MED ORDER — LISINOPRIL-HYDROCHLOROTHIAZIDE 10-12.5 MG PO TABS: 1.0000 | ORAL_TABLET | Freq: Every day | ORAL | 3 refills | Status: DC

## 2017-01-03 NOTE — Progress Notes (Signed)
HTN- patient reports that at home her blood pressures and running in the 140s on her home cuff. We'll go ahead and start her on a low-dose of lisinopril HCTZ and have her follow-up in 1-2 weeks for nurse visit. She is to avoid any Vyvanse until we can get her blood pressure under control but does look like she truly has underlying hypertension and it is not just a side effect from the stimulant medication. Will need to check a BMP at her follow-up appointment in one week.  Nani Gasseratherine Jamita Mckelvin, MD

## 2017-01-03 NOTE — Progress Notes (Signed)
Patient came into clinic today for BP check. First BP check was 165/65 (83), second was 152/66 (81). Pt reports she has not taken her Vyvance today. Pt has been taking her BP at home, reports it has been in the 140-145's systolic. Information provided to PCP. Pt was advised we are going to send over an Rx for BP medication. Pt to not take her Vyvance at all and return next week for nurse BP check. Verbalized understanding. No further questions.

## 2017-01-13 ENCOUNTER — Ambulatory Visit: Payer: BLUE CROSS/BLUE SHIELD

## 2017-01-14 ENCOUNTER — Ambulatory Visit (INDEPENDENT_AMBULATORY_CARE_PROVIDER_SITE_OTHER): Payer: BLUE CROSS/BLUE SHIELD | Admitting: Family Medicine

## 2017-01-14 VITALS — BP 151/64 | HR 94 | Wt 132.0 lb

## 2017-01-14 DIAGNOSIS — I10 Essential (primary) hypertension: Secondary | ICD-10-CM | POA: Diagnosis not present

## 2017-01-14 DIAGNOSIS — Z1211 Encounter for screening for malignant neoplasm of colon: Secondary | ICD-10-CM

## 2017-01-14 DIAGNOSIS — K219 Gastro-esophageal reflux disease without esophagitis: Secondary | ICD-10-CM | POA: Diagnosis not present

## 2017-01-14 MED ORDER — LISINOPRIL-HYDROCHLOROTHIAZIDE 20-25 MG PO TABS: 1.0000 | ORAL_TABLET | Freq: Every day | ORAL | 3 refills | Status: DC

## 2017-01-14 MED ORDER — RANITIDINE HCL 150 MG PO TABS: 150.0000 mg | ORAL_TABLET | Freq: Two times a day (BID) | ORAL | 3 refills | Status: DC

## 2017-01-14 NOTE — Progress Notes (Signed)
   Subjective:    Patient ID: Glenda DillWendy P Laplant, female    DOB: 06/09/1961, 56 y.o.   MRN: [id number]  HPI    Review of Systems     Objective:   Physical Exam        Assessment & Plan:  Agree with above.  Nani Gasseratherine Tylyn Derwin, MD

## 2017-01-14 NOTE — Progress Notes (Signed)
   Subjective:    Patient ID: Glenda Fernandez, female    DOB: 09/13/1961, 56 y.o.   MRN: [id number]  Toniann FailWendy is here today for elevated blood pressure. She started the Lisinopril-HCTZ 10-12.5 mg as directed. Denies any problems with medications.   Colon cancer screening - Patient is due for colonoscopy. She has agreed to the referral for a colonoscopy. Denies any problems, no diarrhea for constipation.    Hypertension  This is a new problem. The current episode started 1 to 4 weeks ago. Pertinent negatives include no chest pain, headaches, palpitations, peripheral edema, shortness of breath or sweats. The current treatment provides no improvement. There are no compliance problems.   Gastroesophageal Reflux  She complains of nausea. She reports no chest pain. The problem occurs occasionally. Pertinent negatives include no fatigue, muscle weakness or weight loss.      Review of Systems  Constitutional: Negative for fatigue and weight loss.  Respiratory: Negative for shortness of breath.   Cardiovascular: Negative for chest pain and palpitations.  Gastrointestinal: Positive for nausea.  Musculoskeletal: Negative for muscle weakness.  Neurological: Negative for headaches.       Objective:   Physical Exam        Assessment & Plan:  Hypertension - Per Dr Linford ArnoldMetheney - Patient advised to start an increase of Lisinopril-HCTZ to 20-25 mg daily. You can take 2 tablets of the Lisinopril 10-12.5 mg daily. New prescription of the Lisinopril-HCTZ 20-25 mg. Please follow up in 2 weeks for blood pressure check on the nurse schedule. Go to lab today. Do not take Vyvanse.  Colon Cancer screening - A referral for a colonoscopy has been ordered and their office will contact you for appointment.  GERD -  Zantac 150 mg twice daily was sent to pharmacy.

## 2017-01-14 NOTE — Patient Instructions (Signed)
Start an increase of Lisinopril-HCTZ to 20-25 mg daily. You can take 2 tablets of the Lisinopril 10-12.5 mg daily. New prescription of the Lisinopril-HCTZ 20-25 mg and Zantac 150 mg twice daily was sent to pharmacy. A referral for a colonoscopy has been ordered and their office will contact you for appointment. Please follow up in 2 weeks for blood pressure check on the nurse schedule. Go to lab today. Do not take Vyvanse.

## 2017-01-15 LAB — BASIC METABOLIC PANEL WITH GFR
BUN: 12 mg/dL (ref 7–25)
CO2: 25 mmol/L (ref 20–31)
CREATININE: 0.72 mg/dL (ref 0.50–1.05)
Calcium: 9.4 mg/dL (ref 8.6–10.4)
Chloride: 104 mmol/L (ref 98–110)
GFR, Est Non African American: >89 mL/min (ref >=60)
GLUCOSE: 79 mg/dL (ref 65–99)
Potassium: 4.1 mmol/L (ref 3.5–5.3)
Sodium: 140 mmol/L (ref 135–146)

## 2017-01-15 IMAGING — DX DG WRIST COMPLETE 3+V*R*
3 series · 3 of 3 positions shown · non-contrast
Comparison: 10/11/2016.

CLINICAL DATA: Post reduction

EXAM:
RIGHT WRIST - COMPLETE 3+ VIEW

[wrist pa]
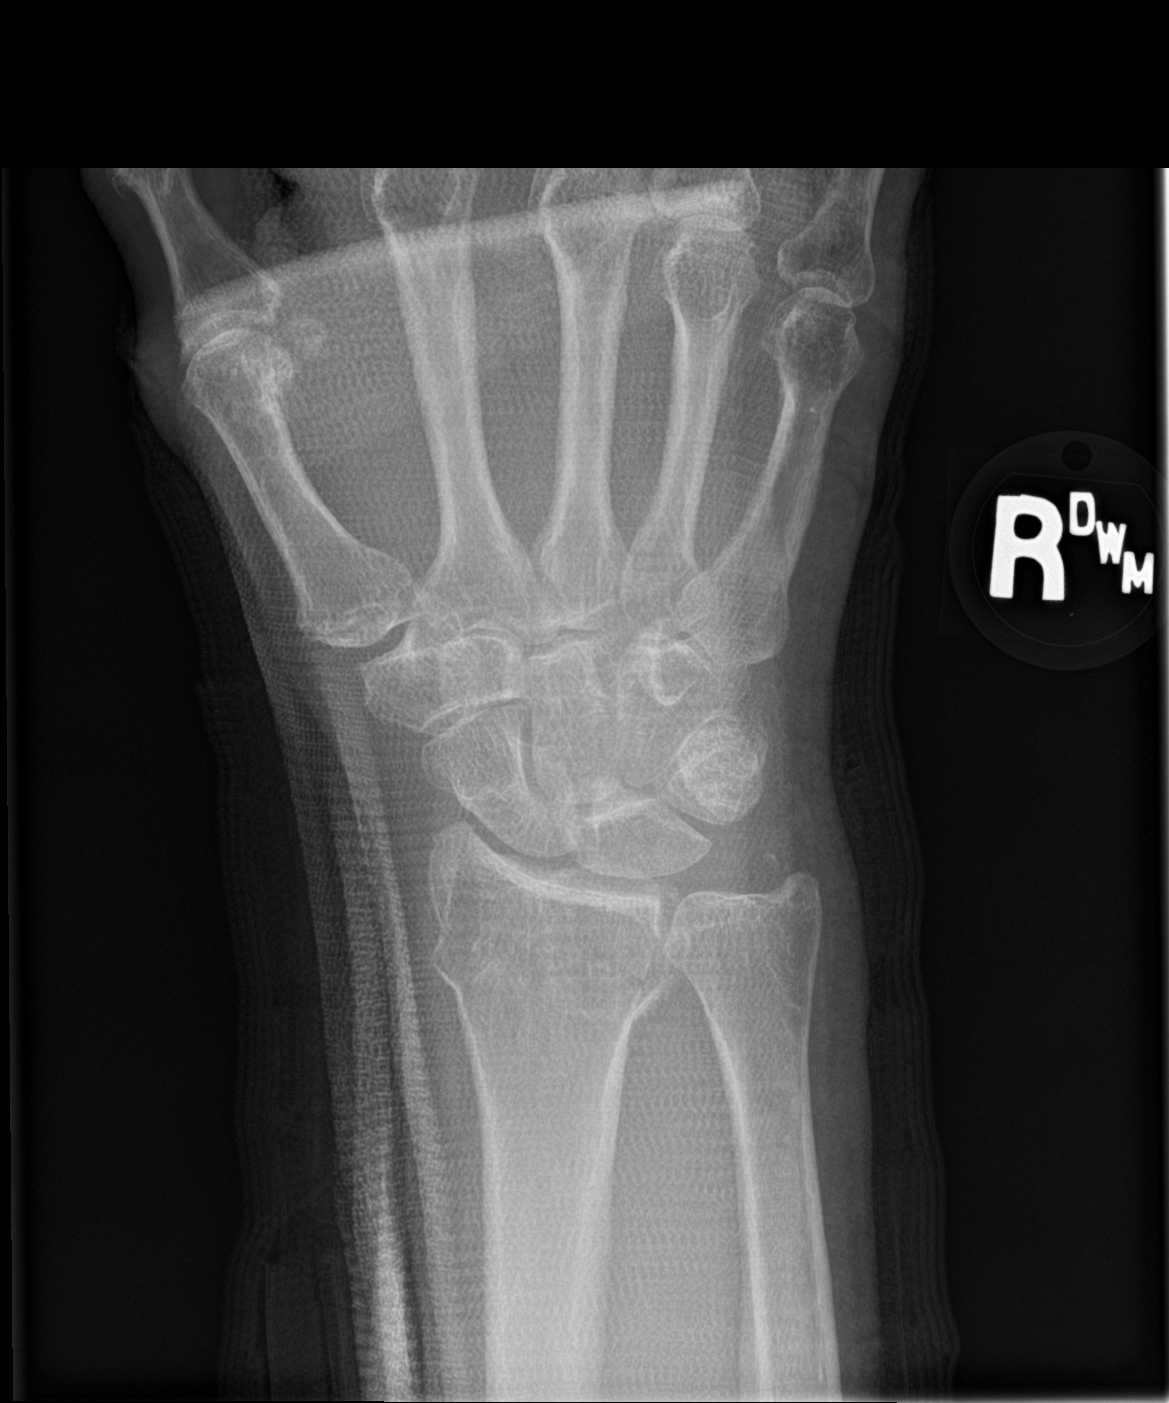

[wrist lat (1 of 2)]
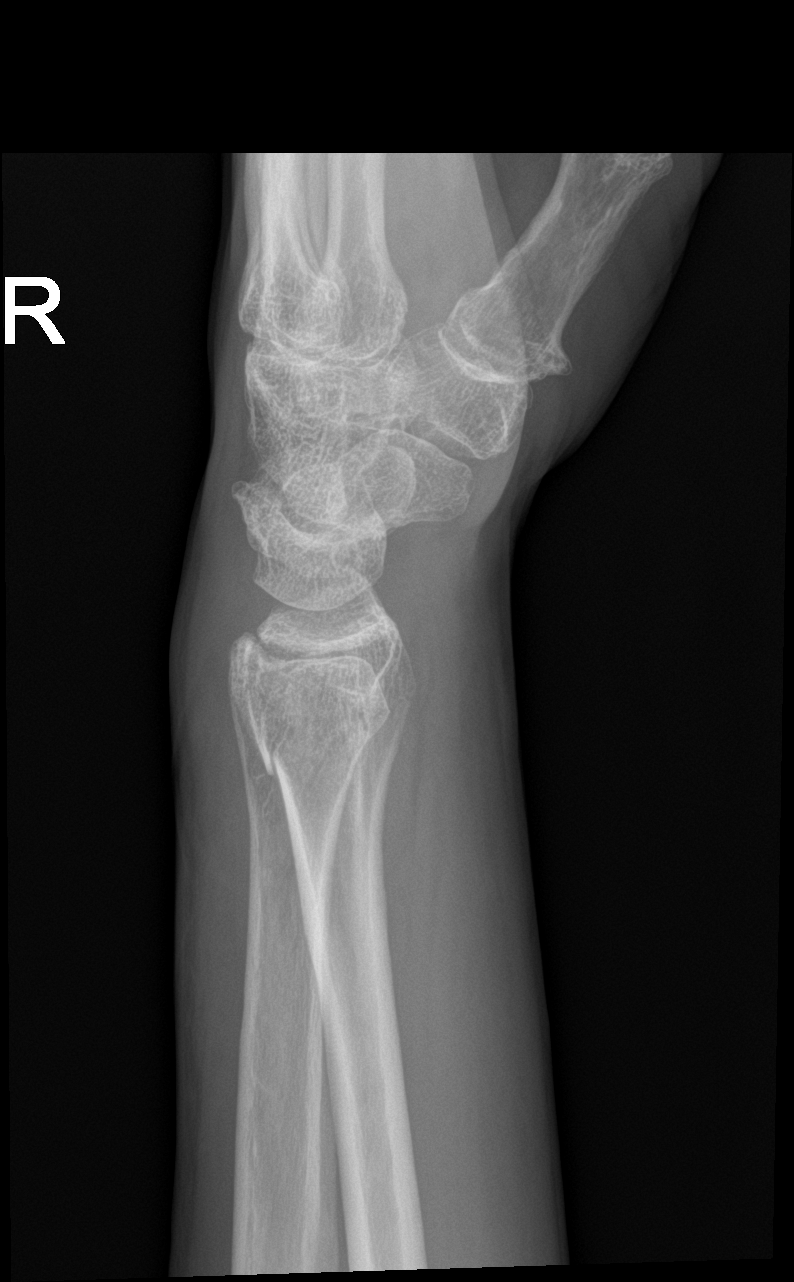

[wrist lat (2 of 2)]
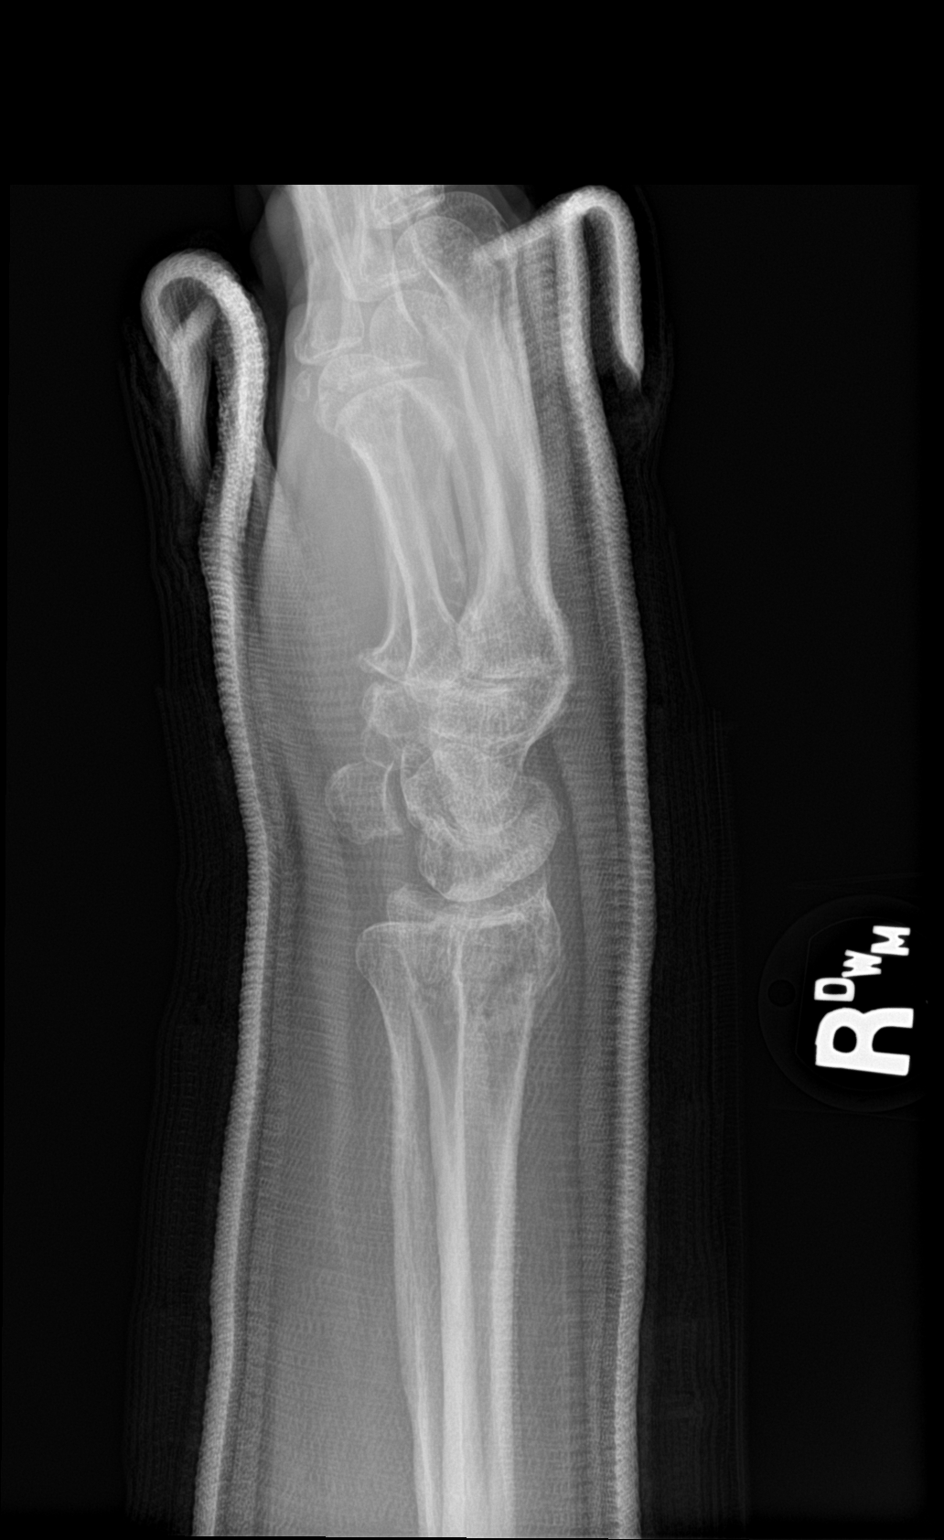

[3 of 3 positions shown; findings below may reference images not displayed]

FINDINGS: Transverse fracture distal radius. Improved alignment with decrease
in dorsal displacement and angulation. Satisfactory alignment
postreduction. Tiny avulsion fracture tip of the ulnar styloid is
unchanged. Cast been placed
IMPRESSION: Satisfactory alignment of distal radial fracture following reduction
and casting.

## 2017-01-15 NOTE — Progress Notes (Signed)
All labs are normal. 

## 2017-01-27 ENCOUNTER — Ambulatory Visit (INDEPENDENT_AMBULATORY_CARE_PROVIDER_SITE_OTHER): Payer: BLUE CROSS/BLUE SHIELD | Admitting: Family Medicine

## 2017-01-27 DIAGNOSIS — F902 Attention-deficit hyperactivity disorder, combined type: Secondary | ICD-10-CM

## 2017-01-27 MED ORDER — LISDEXAMFETAMINE DIMESYLATE 70 MG PO CAPS: 70.0000 mg | ORAL_CAPSULE | ORAL | 0 refills | Status: DC

## 2017-01-27 NOTE — Progress Notes (Signed)
Agree with above.  Samanatha Brammer, MD  

## 2017-01-27 NOTE — Progress Notes (Signed)
Patient came into clinic today for repeat blood pressure check. Pt states at last OV her BP Rx was increased from lisionpril-HCTZ 10/12.5mg  to lisinopril-HCTZ 20/25mg . Pt also brought a log of her home BP readings today as well as her home BP machine. Pt's BP in office on our machine was 144/62 (81), on her home machine 149/76 (82). Pt BP at recheck was 137/71 (89). Per PCP, Pt is to continue new dose of lisinopril-HCTZ 20/25mg  (no new Rx needed) and it is OK to refill Vyvance. Pt to follow up with PCP in one month, advised to bring a home BP log with her to appt. Verbalized understanding.  Home BP readings as follows: 01/15/17: 107/89 (am), 124/64 (pm) 01/16/17: 121/66 (am), 131/65 (pm) 01/17/17: 127/69 (am), 115/63 (pm) 01/18/17: 121/62 (pm) 01/20/17: 118/56 (am), 130/66 (pm) 01/21/17: 133/69 (am), 119/70 (pm) 01/22/17: 89/59 (am), 119/64 (pm) 01/23/17: 123/71 (am), 120/72 (pm) 01/24/17: 114/70 (am), 105/71 (pm) 01/25/17: 115/68 (am), 111/77 (pm) 01/26/17: 123/71 (am), 114/62 (pm)

## 2017-01-30 ENCOUNTER — Other Ambulatory Visit: Payer: Self-pay | Admitting: Family Medicine

## 2017-02-17 ENCOUNTER — Encounter: Payer: Self-pay | Admitting: Family Medicine

## 2017-02-17 ENCOUNTER — Ambulatory Visit (INDEPENDENT_AMBULATORY_CARE_PROVIDER_SITE_OTHER): Payer: BLUE CROSS/BLUE SHIELD | Admitting: Family Medicine

## 2017-02-17 VITALS — BP 127/60 | HR 80 | Wt 130.0 lb

## 2017-02-17 DIAGNOSIS — I10 Essential (primary) hypertension: Secondary | ICD-10-CM

## 2017-02-17 DIAGNOSIS — F902 Attention-deficit hyperactivity disorder, combined type: Secondary | ICD-10-CM

## 2017-02-17 DIAGNOSIS — E7800 Pure hypercholesterolemia, unspecified: Secondary | ICD-10-CM | POA: Diagnosis not present

## 2017-02-17 DIAGNOSIS — K219 Gastro-esophageal reflux disease without esophagitis: Secondary | ICD-10-CM

## 2017-02-17 MED ORDER — LISDEXAMFETAMINE DIMESYLATE 70 MG PO CAPS: 70.0000 mg | ORAL_CAPSULE | ORAL | 0 refills | Status: DC

## 2017-02-17 MED ORDER — LISDEXAMFETAMINE DIMESYLATE 70 MG PO CAPS: 70.0000 mg | ORAL_CAPSULE | Freq: Every day | ORAL | 0 refills | Status: DC

## 2017-02-17 MED ORDER — RANITIDINE HCL 150 MG PO TABS: 150.0000 mg | ORAL_TABLET | Freq: Two times a day (BID) | ORAL | 1 refills | Status: DC

## 2017-02-17 NOTE — Progress Notes (Signed)
Subjective:    CC: BP, ADD  HPI: ADHD  - Weighted recently held her Vyvanse until she was able to get her blood pressure under control. We did end up having to start her on a blood pressure pill.  Hypertension- Pt denies chest pain, SOB, dizziness, or heart palpitations.  Taking meds as directed w/o problems.  Denies medication side effects.  Brought in home blood pressure log today.  Blood pressures mostly running in the low 120s over 60s.  GERD - Requesting refill on her ranitidine today for 90 days. .Takes it daily.   Hyperlipidemia-LDL is 173 back in September. We decided to place her on Mevacor. She's been tolerating that well without any side effects or myalgias.  Past medical history, Surgical history, Family history not pertinant except as noted below, Social history, Allergies, and medications have been entered into the medical record, reviewed, and corrections made.   Review of Systems: No fevers, chills, night sweats, weight loss, chest pain, or shortness of breath.   Objective:    General: Well Developed, well nourished, and in no acute distress.  Neuro: Alert and oriented x3, extra-ocular muscles intact, sensation grossly intact.  HEENT: Normocephalic, atraumatic  Skin: Warm and dry, no rashes. Cardiac: Regular rate and rhythm, no murmurs rubs or gallops, no lower extremity edema.  Respiratory: Clear to auscultation bilaterally. Not using accessory muscles, speaking in full sentences.   Impression and Recommendations:    HTN - Well controlled. Continue current regimen. Follow up in  6 months.   ADHD -  Well controlled. Refilles x 3 months.   GERD- RF for ranititine sent.   Hyperlipidemia-due to recheck lipids and liver before next office visit. Lab slip printed today.

## 2017-05-20 ENCOUNTER — Encounter: Payer: Self-pay | Admitting: Family Medicine

## 2017-05-20 ENCOUNTER — Ambulatory Visit (INDEPENDENT_AMBULATORY_CARE_PROVIDER_SITE_OTHER): Payer: BLUE CROSS/BLUE SHIELD | Admitting: Family Medicine

## 2017-05-20 VITALS — BP 126/49 | HR 78 | Ht 66.54 in | Wt 134.0 lb

## 2017-05-20 DIAGNOSIS — F902 Attention-deficit hyperactivity disorder, combined type: Secondary | ICD-10-CM

## 2017-05-20 DIAGNOSIS — I10 Essential (primary) hypertension: Secondary | ICD-10-CM

## 2017-05-20 DIAGNOSIS — E7800 Pure hypercholesterolemia, unspecified: Secondary | ICD-10-CM | POA: Diagnosis not present

## 2017-05-20 MED ORDER — LISDEXAMFETAMINE DIMESYLATE 70 MG PO CAPS: 70.0000 mg | ORAL_CAPSULE | Freq: Every day | ORAL | 0 refills | Status: DC

## 2017-05-20 MED ORDER — LISDEXAMFETAMINE DIMESYLATE 70 MG PO CAPS
70.0000 mg | ORAL_CAPSULE | Freq: Every day | ORAL | 0 refills | Status: DC
Start: 2017-05-20 — End: 2017-05-20

## 2017-05-20 MED ORDER — LOVASTATIN 40 MG PO TABS: 40.0000 mg | ORAL_TABLET | Freq: Every day | ORAL | 3 refills | Status: DC

## 2017-05-20 NOTE — Progress Notes (Signed)
   Subjective:    Patient ID: Glenda Fernandez, female    DOB: 09/27/1961, 56 y.o.   MRN: [id number]  HPI ADHD - Currently on Vyvanse 70 mg. Tolerated medication well without any side effects. No chest pain shortness breath or palpitations. No insomnia on the medication.  Hypertension- Pt denies chest pain, SOB, dizziness, or heart palpitations.  Taking meds as directed w/o problems.  Denies medication side effects.     Review of Systems     Objective:   Physical Exam  Constitutional: She is oriented to person, place, and time. She appears well-developed and well-nourished.  HENT:  Head: Normocephalic and atraumatic.  Cardiovascular: Normal rate, regular rhythm and normal heart sounds.   Pulmonary/Chest: Effort normal and breath sounds normal.  Neurological: She is alert and oriented to person, place, and time.  Skin: Skin is warm and dry.  Psychiatric: She has a normal mood and affect. Her behavior is normal.        Assessment & Plan:  ADHD- Doing well on current regimen. Refills provided for 90 days. Follow-up in 3 months.  Hypertension- Pt denies chest pain, SOB, dizziness, or heart palpitations.  Taking meds as directed w/o problems.  Denies medication side effects.    Discussed need for colon cancer screening. She opted to do: Card.

## 2017-05-21 LAB — LIPID PANEL W/REFLEX DIRECT LDL
CHOL/HDL RATIO: 4.9 ratio (ref <5.0)
Cholesterol: 214 mg/dL — ABNORMAL HIGH (ref <200)
HDL: 44 mg/dL — AB (ref >50)
LDL-CHOLESTEROL: 139 mg/dL — AB
Non-HDL Cholesterol (Calc): 170 mg/dL — ABNORMAL HIGH (ref <130)
Triglycerides: 177 mg/dL — ABNORMAL HIGH (ref <150)

## 2017-05-21 LAB — COMPLETE METABOLIC PANEL WITH GFR
ALK PHOS: 52 U/L (ref 33–130)
ALT: 9 U/L (ref 6–29)
AST: 15 U/L (ref 10–35)
Albumin: 3.8 g/dL (ref 3.6–5.1)
BUN: 13 mg/dL (ref 7–25)
CO2: 26 mmol/L (ref 20–31)
Calcium: 8.9 mg/dL (ref 8.6–10.4)
Chloride: 106 mmol/L (ref 98–110)
Creat: 0.9 mg/dL (ref 0.50–1.05)
GFR, EST NON AFRICAN AMERICAN: 71 mL/min (ref >=60)
GFR, Est African American: 82 mL/min (ref >=60)
GLUCOSE: 67 mg/dL (ref 65–99)
POTASSIUM: 4.3 mmol/L (ref 3.5–5.3)
SODIUM: 141 mmol/L (ref 135–146)
Total Bilirubin: 0.2 mg/dL (ref 0.2–1.2)
Total Protein: 6.4 g/dL (ref 6.1–8.1)

## 2017-05-21 LAB — HEPATITIS C ANTIBODY: HCV Ab: NEGATIVE

## 2017-08-20 ENCOUNTER — Ambulatory Visit (INDEPENDENT_AMBULATORY_CARE_PROVIDER_SITE_OTHER): Payer: BLUE CROSS/BLUE SHIELD | Admitting: Family Medicine

## 2017-08-20 ENCOUNTER — Encounter: Payer: Self-pay | Admitting: Family Medicine

## 2017-08-20 VITALS — BP 128/68 | HR 79 | Wt 133.0 lb

## 2017-08-20 DIAGNOSIS — I10 Essential (primary) hypertension: Secondary | ICD-10-CM

## 2017-08-20 DIAGNOSIS — Z23 Encounter for immunization: Secondary | ICD-10-CM | POA: Diagnosis not present

## 2017-08-20 DIAGNOSIS — F330 Major depressive disorder, recurrent, mild: Secondary | ICD-10-CM | POA: Diagnosis not present

## 2017-08-20 MED ORDER — LISDEXAMFETAMINE DIMESYLATE 70 MG PO CAPS: 70.0000 mg | ORAL_CAPSULE | Freq: Every day | ORAL | 0 refills | Status: DC

## 2017-08-20 MED ORDER — LISDEXAMFETAMINE DIMESYLATE 70 MG PO CAPS
70.0000 mg | ORAL_CAPSULE | Freq: Every day | ORAL | 0 refills | Status: DC
Start: 2017-08-20 — End: 2017-11-18

## 2017-08-20 NOTE — Progress Notes (Signed)
Subjective:    CC:  ADHD  HPI:  F/U ADHD - she denies any chest pain, shortness of breath palpitations or insomnia on the medication. She feels like it's working well and has not had any problems or side effects.  Follow-up depression-she has been under a little bit more stress recently. Particularly with finances. She is actually looking for part-time job to help out.currently on fluoxetine 40 mg daily.  Past medical history, Surgical history, Family history not pertinant except as noted below, Social history, Allergies, and medications have been entered into the medical record, reviewed, and corrections made.   Review of Systems: No fevers, chills, night sweats, weight loss, chest pain, or shortness of breath.   Objective:    General: Well Developed, well nourished, and in no acute distress.  Neuro: Alert and oriented x3, extra-ocular muscles intact, sensation grossly intact.  HEENT: Normocephalic, atraumatic  Skin: Warm and dry, no rashes. Cardiac: Regular rate and rhythm, no murmurs rubs or gallops, no lower extremity edema.  Respiratory: Clear to auscultation bilaterally. Not using accessory muscles, speaking in full sentences.   Impression and Recommendations:    ADHD - continue current regimen. Follow-up in 4 months.  Depression-PHQ 9 score of 5 today.continue current regimen. Follow-up in 6 months. Hopefully she cart-time jobs I think this will relieve some of her financial stresso

## 2017-10-19 ENCOUNTER — Other Ambulatory Visit: Payer: Self-pay | Admitting: Family Medicine

## 2017-10-30 ENCOUNTER — Other Ambulatory Visit: Payer: Self-pay | Admitting: Family Medicine

## 2017-10-30 DIAGNOSIS — K219 Gastro-esophageal reflux disease without esophagitis: Secondary | ICD-10-CM

## 2017-11-18 ENCOUNTER — Telehealth: Payer: Self-pay | Admitting: Family Medicine

## 2017-11-18 ENCOUNTER — Other Ambulatory Visit: Payer: Self-pay | Admitting: Family Medicine

## 2017-11-18 MED ORDER — LISDEXAMFETAMINE DIMESYLATE 70 MG PO CAPS: 70.0000 mg | ORAL_CAPSULE | Freq: Every day | ORAL | 0 refills | Status: DC

## 2017-11-18 NOTE — Telephone Encounter (Signed)
Called patient and informed rx sent to pharmacy. KG LPN

## 2017-11-18 NOTE — Telephone Encounter (Signed)
Pt called. She's requesting refill on vyvanse.

## 2017-11-18 NOTE — Telephone Encounter (Signed)
Ok, I sent refill to pharmacy.

## 2017-11-19 ENCOUNTER — Other Ambulatory Visit: Payer: Self-pay | Admitting: Family Medicine

## 2017-11-20 DIAGNOSIS — E785 Hyperlipidemia, unspecified: Secondary | ICD-10-CM | POA: Diagnosis not present

## 2017-11-20 DIAGNOSIS — Z23 Encounter for immunization: Secondary | ICD-10-CM | POA: Diagnosis not present

## 2017-11-20 DIAGNOSIS — S0083XA Contusion of other part of head, initial encounter: Secondary | ICD-10-CM | POA: Diagnosis not present

## 2017-11-20 DIAGNOSIS — S0031XA Abrasion of nose, initial encounter: Secondary | ICD-10-CM | POA: Diagnosis not present

## 2017-11-20 DIAGNOSIS — F909 Attention-deficit hyperactivity disorder, unspecified type: Secondary | ICD-10-CM | POA: Diagnosis not present

## 2017-11-20 DIAGNOSIS — F329 Major depressive disorder, single episode, unspecified: Secondary | ICD-10-CM | POA: Diagnosis not present

## 2017-11-20 DIAGNOSIS — Z79899 Other long term (current) drug therapy: Secondary | ICD-10-CM | POA: Diagnosis not present

## 2017-11-20 DIAGNOSIS — M79644 Pain in right finger(s): Secondary | ICD-10-CM | POA: Diagnosis not present

## 2017-11-20 DIAGNOSIS — S6991XA Unspecified injury of right wrist, hand and finger(s), initial encounter: Secondary | ICD-10-CM | POA: Diagnosis not present

## 2017-11-20 DIAGNOSIS — S0081XA Abrasion of other part of head, initial encounter: Secondary | ICD-10-CM | POA: Diagnosis not present

## 2017-11-20 DIAGNOSIS — F17210 Nicotine dependence, cigarettes, uncomplicated: Secondary | ICD-10-CM | POA: Diagnosis not present

## 2017-11-21 DIAGNOSIS — H52223 Regular astigmatism, bilateral: Secondary | ICD-10-CM | POA: Diagnosis not present

## 2017-12-18 ENCOUNTER — Encounter: Payer: Self-pay | Admitting: Family Medicine

## 2017-12-18 ENCOUNTER — Ambulatory Visit (INDEPENDENT_AMBULATORY_CARE_PROVIDER_SITE_OTHER): Payer: BLUE CROSS/BLUE SHIELD | Admitting: Family Medicine

## 2017-12-18 VITALS — BP 134/53 | HR 75 | Ht 67.0 in | Wt 138.0 lb

## 2017-12-18 DIAGNOSIS — F418 Other specified anxiety disorders: Secondary | ICD-10-CM

## 2017-12-18 DIAGNOSIS — F902 Attention-deficit hyperactivity disorder, combined type: Secondary | ICD-10-CM

## 2017-12-18 DIAGNOSIS — I10 Essential (primary) hypertension: Secondary | ICD-10-CM

## 2017-12-18 DIAGNOSIS — Z9181 History of falling: Secondary | ICD-10-CM

## 2017-12-18 LAB — BASIC METABOLIC PANEL WITH GFR
BUN: 15 mg/dL (ref 7–25)
CO2: 29 mmol/L (ref 20–32)
CREATININE: 0.94 mg/dL (ref 0.50–1.05)
Calcium: 9.1 mg/dL (ref 8.6–10.4)
Chloride: 102 mmol/L (ref 98–110)
GFR, EST NON AFRICAN AMERICAN: 67 mL/min/{1.73_m2} (ref >=60)
GFR, Est African American: 78 mL/min/{1.73_m2} (ref >=60)
Glucose, Bld: 88 mg/dL (ref 65–99)
Potassium: 4.4 mmol/L (ref 3.5–5.3)
SODIUM: 138 mmol/L (ref 135–146)

## 2017-12-18 MED ORDER — LISDEXAMFETAMINE DIMESYLATE 70 MG PO CAPS: 70.0000 mg | ORAL_CAPSULE | Freq: Every day | ORAL | 0 refills | Status: DC

## 2017-12-18 MED ORDER — HYDROXYZINE HCL 10 MG PO TABS: 10.0000 mg | ORAL_TABLET | Freq: Three times a day (TID) | ORAL | 0 refills | Status: DC | PRN

## 2017-12-18 NOTE — Progress Notes (Signed)
Subjective:    CC: ADHD, BP check.   HPI: ADHD - Reports symptoms are well controlled on current regime. Denies any problems with insomnia, chest pain, palpitations, or SOB.    Hypertension- Pt denies chest pain, SOB, dizziness, or heart palpitations.  Taking meds as directed w/o problems.  Denies medication side effects.    She also will in early December on December 6.  She tripped over a rock as her dog was pulling her in the opposite direction she fell and hit her face on a dog house with a metal roof.  She bruised up her face and forehead.  She did go to the emergency department to rule out fracture.  She is mostly been using Advil for pain relief.  She still has a goose egg on her right forehead.  She denies any dizziness or lightheadedness.  She also reports some situational anxiety.  Her family lives in her home with 3 small children and sometimes this irritates her and aggravates her and makes her little bit more anxious.  She is wondering if there is something that she could take in addition to her fluoxetine that might be helpful with this.  Has some soreness in her right thumb as well but it is getting well  Past medical history, Surgical history, Family history not pertinant except as noted below, Social history, Allergies, and medications have been entered into the medical record, reviewed, and corrections made.   Review of Systems: No fevers, chills, night sweats, weight loss, chest pain, or shortness of breath.   Objective:    General: Well Developed, well nourished, and in no acute distress.  Neuro: Alert and oriented x3, extra-ocular muscles intact, sensation grossly intact.  HEENT: Normocephalic, atraumatic  Skin: Warm and dry, no rashes. She does have a large bruised know on her right forehead.   Cardiac: Regular rate and rhythm, no murmurs rubs or gallops, no lower extremity edema.  Respiratory: Clear to auscultation bilaterally. Not using accessory muscles, speaking in  full sentences.   Impression and Recommendations:    ADHD -well controlled.  Asymptomatic.  Refill sent for the next 90 days. F/U in 4 months.   HTN -  Well controlled. Continue current regimen. Follow up in  6 months. Check BMP.   Recent fall-trauma to the face that she is healing and doing well.  Situational anxiety-continue fluoxetine and will add a trial of hydroxyzine as needed.

## 2017-12-19 NOTE — Progress Notes (Signed)
All labs are normal. 

## 2018-01-15 ENCOUNTER — Other Ambulatory Visit: Payer: Self-pay

## 2018-01-15 MED ORDER — LISDEXAMFETAMINE DIMESYLATE 70 MG PO CAPS: 70.0000 mg | ORAL_CAPSULE | Freq: Every day | ORAL | 0 refills | Status: DC

## 2018-01-15 NOTE — Telephone Encounter (Signed)
Toniann FailWendy states the February prescription for the Vyvanse reads ok to fill on 01/19/18 and it should read 01/17/18 so she doesn't run out. March prescription is ok. Please advise.

## 2018-01-15 NOTE — Telephone Encounter (Signed)
The pharmacy can't take a verbal. I cancelled the prescription.

## 2018-01-15 NOTE — Telephone Encounter (Signed)
New rx sent

## 2018-01-15 NOTE — Telephone Encounter (Signed)
Please call pharmacy and give verbal that it is okay to fill the prescription that says February 4 on February 2.

## 2018-01-18 ENCOUNTER — Other Ambulatory Visit: Payer: Self-pay | Admitting: Family Medicine

## 2018-01-18 DIAGNOSIS — I10 Essential (primary) hypertension: Secondary | ICD-10-CM

## 2018-01-19 ENCOUNTER — Other Ambulatory Visit: Payer: Self-pay | Admitting: Family Medicine

## 2018-01-20 ENCOUNTER — Other Ambulatory Visit: Payer: Self-pay | Admitting: Family Medicine

## 2018-01-20 ENCOUNTER — Ambulatory Visit: Payer: BLUE CROSS/BLUE SHIELD | Admitting: Family Medicine

## 2018-02-16 ENCOUNTER — Ambulatory Visit (INDEPENDENT_AMBULATORY_CARE_PROVIDER_SITE_OTHER): Payer: BLUE CROSS/BLUE SHIELD | Admitting: Physician Assistant

## 2018-02-16 ENCOUNTER — Encounter: Payer: Self-pay | Admitting: Physician Assistant

## 2018-02-16 VITALS — BP 122/49 | HR 93 | Temp 98.3°F | Ht 67.0 in | Wt 142.0 lb

## 2018-02-16 DIAGNOSIS — J019 Acute sinusitis, unspecified: Secondary | ICD-10-CM | POA: Insufficient documentation

## 2018-02-16 DIAGNOSIS — J4521 Mild intermittent asthma with (acute) exacerbation: Secondary | ICD-10-CM | POA: Insufficient documentation

## 2018-02-16 DIAGNOSIS — J0140 Acute pansinusitis, unspecified: Secondary | ICD-10-CM

## 2018-02-16 MED ORDER — PREDNISONE 50 MG PO TABS: ORAL_TABLET | ORAL | 0 refills | Status: DC

## 2018-02-16 MED ORDER — AZITHROMYCIN 250 MG PO TABS: ORAL_TABLET | ORAL | 0 refills | Status: DC

## 2018-02-16 MED ORDER — IPRATROPIUM-ALBUTEROL 0.5-2.5 (3) MG/3ML IN SOLN: 3.0000 mL | RESPIRATORY_TRACT | 0 refills | Status: DC | PRN

## 2018-02-16 NOTE — Patient Instructions (Signed)

## 2018-02-16 NOTE — Progress Notes (Signed)
Subjective:     Patient ID: Glenda Fernandez, female   DOB: 1961-04-20, 57 y.o.   MRN: [id number]  HPI   Ms. Veazie is a 57 year old female with PMH of Asthma, arthritis, depression, GERD presents to the office with CC of head cold. Her symptoms is going on for last two weeks. She planned to see a provider last week, but she felt improvement in her symptoms on Friday. However, over the weekend her symptoms worsen again. She had dry cough, sinus congestions, headache. She tried OTC dextromethorphan 30 mg tablet to relieve her symptoms with little help but made her sleepy. She had rhinorrhea, mild SOB on exertion, but denies fever,chills, night sweats, chest pain, palpitation, nausea, vomiting, diarrhea, constipation, vision changes, syncope, weakness, hemoptysis. Patient states that she use Albuterol inhaler 3-4 times a day but used nebulizer during last episode an year ago and it helped better than inhaler. Patient feels painful pop at her right ear whenever she cough. .. Active Ambulatory Problems    Diagnosis Date Noted  . INCONTINENCE, STRESS, FEMALE 09/23/2006  . INSOMNIA NOS 09/23/2006  . Hyperlipidemia 09/10/2012  . ADHD (attention deficit hyperactivity disorder) 09/10/2012  . Tobacco abuse 09/10/2012  . Anemia, unspecified 09/11/2012  . Depression 09/11/2012  . 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 Patellar fracture 06/22/2014  . Right cervical radiculopathy 10/20/2015  . Right shoulder pain 10/20/2015  . Fracture of distal radius and ulna, right, closed, initial encounter 10/11/2016  . Essential hypertension 05/20/2017  . Acute sinusitis 02/16/2018  . Mild intermittent asthma with acute exacerbation 02/16/2018   Resolved Ambulatory Problems    Diagnosis Date Noted  . Abscess of axilla, left 11/06/2012  . Contusion 10/03/2015  . Acute bronchitis 12/30/2016   Past Medical History:  Diagnosis Date  . Arthritis   . Asthma   . Depression   . GERD (gastroesophageal reflux  disease)    Review of Systems   As stated in ROS.     Objective:   Physical Exam  Constitutional: She is oriented to person, place, and time. She appears well-developed and well-nourished. No distress.  HENT:  Head: Normocephalic and atraumatic.  Right Ear: External ear normal.  Left Ear: External ear normal.  Nose: Nose normal.  Mouth/Throat: No oropharyngeal exudate.  Eyes: Conjunctivae are normal. Pupils are equal, round, and reactive to light. Right eye exhibits no discharge. Left eye exhibits no discharge.  Neck: Neck supple. No thyromegaly present.  Cardiovascular: Normal rate, regular rhythm, normal heart sounds and intact distal pulses. Exam reveals no gallop and no friction rub.  No murmur heard. Pulmonary/Chest: Effort normal. No respiratory distress.  Expiratory wheezing with rhonchi throughout all lung fields. Left and right lowe lobes crackles.    Lymphadenopathy:    She has no cervical adenopathy.  Neurological: She is alert and oriented to person, place, and time.  Psychiatric: She has a normal mood and affect. Her behavior is normal. Judgment and thought content normal.     Assessment:     Marland KitchenMarland KitchenDiagnoses and all orders for this visit:  Acute non-recurrent pansinusitis -     azithromycin (ZITHROMAX) 250 MG tablet; Take 2 tablets now and then one tablet for 4 days.  Mild intermittent asthma with acute exacerbation -     ipratropium-albuterol (DUONEB) 0.5-2.5 (3) MG/3ML SOLN; Take 3 mLs by nebulization every 4 (four) hours as needed. -     predniSONE (DELTASONE) 50 MG tablet; Take one tablet for 5 days.  Plan:     Patient has lingering sinus congestions for 2 weeks, worsen over last weekend. Her symptoms most consistent with acute sinusitis. Due to length of her symptoms, treatment with anabiotics is indicated at this time. Patient reported responding well with Azithromycin in the past. Will prescribe Azithromycin. HO given.   Patient has history of asthma.  Patient exhibited worsening asthma symptoms. Coarse breath sounds noted in all lung fields, Patient responded well with Albuterol nebulizer instead of inhaler during her last sinusitis episode. Patient has nebulizer at home. Will prescribe  Duoneb vials for asthma. Due to coarse breath sounds in all lung fields, benefit of systemic steroid is discussed with patient. Will order prednisone for 5 days.

## 2018-02-19 ENCOUNTER — Other Ambulatory Visit: Payer: Self-pay | Admitting: Family Medicine

## 2018-03-15 ENCOUNTER — Other Ambulatory Visit: Payer: Self-pay | Admitting: Physician Assistant

## 2018-03-15 DIAGNOSIS — J4521 Mild intermittent asthma with (acute) exacerbation: Secondary | ICD-10-CM

## 2018-03-16 ENCOUNTER — Other Ambulatory Visit: Payer: Self-pay | Admitting: Family Medicine

## 2018-03-16 ENCOUNTER — Telehealth: Payer: Self-pay | Admitting: Family Medicine

## 2018-03-16 MED ORDER — LISDEXAMFETAMINE DIMESYLATE 70 MG PO CAPS: 70.0000 mg | ORAL_CAPSULE | Freq: Every day | ORAL | 0 refills | Status: DC

## 2018-03-16 NOTE — Telephone Encounter (Signed)
Vyvanse refilled

## 2018-03-18 ENCOUNTER — Other Ambulatory Visit: Payer: Self-pay | Admitting: Family Medicine

## 2018-03-19 ENCOUNTER — Other Ambulatory Visit: Payer: Self-pay | Admitting: *Deleted

## 2018-04-16 ENCOUNTER — Other Ambulatory Visit: Payer: Self-pay | Admitting: Family Medicine

## 2018-04-16 ENCOUNTER — Ambulatory Visit (INDEPENDENT_AMBULATORY_CARE_PROVIDER_SITE_OTHER): Payer: BLUE CROSS/BLUE SHIELD

## 2018-04-16 ENCOUNTER — Encounter: Payer: Self-pay | Admitting: Family Medicine

## 2018-04-16 ENCOUNTER — Ambulatory Visit: Payer: BLUE CROSS/BLUE SHIELD | Admitting: Family Medicine

## 2018-04-16 VITALS — BP 121/54 | HR 94 | Ht 67.0 in | Wt 142.0 lb

## 2018-04-16 DIAGNOSIS — M79675 Pain in left toe(s): Secondary | ICD-10-CM | POA: Diagnosis not present

## 2018-04-16 DIAGNOSIS — F418 Other specified anxiety disorders: Secondary | ICD-10-CM

## 2018-04-16 DIAGNOSIS — I10 Essential (primary) hypertension: Secondary | ICD-10-CM

## 2018-04-16 DIAGNOSIS — F902 Attention-deficit hyperactivity disorder, combined type: Secondary | ICD-10-CM

## 2018-04-16 DIAGNOSIS — S92405A Nondisplaced unspecified fracture of left great toe, initial encounter for closed fracture: Secondary | ICD-10-CM | POA: Diagnosis not present

## 2018-04-16 DIAGNOSIS — X58XXXA Exposure to other specified factors, initial encounter: Secondary | ICD-10-CM

## 2018-04-16 DIAGNOSIS — S92425A Nondisplaced fracture of distal phalanx of left great toe, initial encounter for closed fracture: Secondary | ICD-10-CM | POA: Diagnosis not present

## 2018-04-16 DIAGNOSIS — Z1231 Encounter for screening mammogram for malignant neoplasm of breast: Secondary | ICD-10-CM

## 2018-04-16 MED ORDER — HYDROXYZINE HCL 10 MG PO TABS: 10.0000 mg | ORAL_TABLET | Freq: Three times a day (TID) | ORAL | 3 refills | Status: DC | PRN

## 2018-04-16 MED ORDER — LISDEXAMFETAMINE DIMESYLATE 70 MG PO CAPS: 70.0000 mg | ORAL_CAPSULE | Freq: Every day | ORAL | 0 refills | Status: DC

## 2018-04-16 NOTE — Progress Notes (Signed)
Subjective:    CC: 4 months f/u  HPI:  ADHD - Reports symptoms are well controlled on current regime. Denies any problems with insomnia, chest pain, palpitations, or SOB.    Hypertension- Pt denies chest pain, SOB, dizziness, or heart palpitations.  Taking meds as directed w/o problems.  Denies medication side effects.    Situational anxiety/depression-currently on fluoxetine.  Added hydroxyzine for as needed use.  She lives with her daughter and grandchildren and says sometimes it just really gets on her nerves.  She says the hydroxyzine is actually been working really well but wants to be able to take 1-1/2 tabs and wants to know if we can increase the quantity to 45.  Want me to look at her left great toe.  She says this morning she tripped and slipped on some wet plastic that was on the floor.  She is not sure exactly how she injured her toe but it is bruised and swollen.  She is worried she could have broken it.  She had a little bit of soreness to her ankle earlier today but that actually feels a little better.  Past medical history, Surgical history, Family history not pertinant except as noted below, Social history, Allergies, and medications have been entered into the medical record, reviewed, and corrections made.   Review of Systems: No fevers, chills, night sweats, weight loss, chest pain, or shortness of breath.   Objective:    General: Well Developed, well nourished, and in no acute distress.  Neuro: Alert and oriented x3, extra-ocular muscles intact, sensation grossly intact.  HEENT: Normocephalic, atraumatic  Skin: Warm and dry, no rashes. Cardiac: Regular rate and rhythm, no murmurs rubs or gallops, no lower extremity edema.  Respiratory: Clear to auscultation bilaterally. Not using accessory muscles, speaking in full sentences. MSK: Great toe is mildly swollen with significant bruising just below the base of the nail.  Very tender over the distal joint.  A little  swelling over the left distal MCP joint as well.  Normal flexion and extension.   Impression and Recommendations:    ADHD -controlled.  Medication refill sent for the next 90 days.  Otherwise follow-up in 4 months.  She is doing great.  HTN - Well controlled. Continue current regimen. Follow up in  4 months.    Situational anxiety/depression-well on current regimen.  Hydroxyzine quantity increased to 45.  Left great toe pain -commend further evaluation with x-ray to rule out fracture. Encouraged her to ice and use Tylenol as needed for pain.  We can switch to Aleve or ibuprofen as long as the fracture has been ruled out.  We will call her with results once available.

## 2018-04-17 ENCOUNTER — Encounter: Payer: Self-pay | Admitting: Family Medicine

## 2018-04-20 ENCOUNTER — Ambulatory Visit: Payer: BLUE CROSS/BLUE SHIELD | Admitting: Family Medicine

## 2018-04-23 ENCOUNTER — Encounter: Payer: Self-pay | Admitting: *Deleted

## 2018-05-01 ENCOUNTER — Ambulatory Visit: Payer: BLUE CROSS/BLUE SHIELD

## 2018-05-12 ENCOUNTER — Other Ambulatory Visit: Payer: Self-pay | Admitting: Family Medicine

## 2018-05-13 ENCOUNTER — Other Ambulatory Visit: Payer: Self-pay | Admitting: Family Medicine

## 2018-06-06 ENCOUNTER — Other Ambulatory Visit: Payer: Self-pay | Admitting: Family Medicine

## 2018-06-06 DIAGNOSIS — K219 Gastro-esophageal reflux disease without esophagitis: Secondary | ICD-10-CM

## 2018-06-10 ENCOUNTER — Other Ambulatory Visit: Payer: Self-pay | Admitting: Family Medicine

## 2018-06-10 MED ORDER — LISDEXAMFETAMINE DIMESYLATE 70 MG PO CAPS: 70.0000 mg | ORAL_CAPSULE | Freq: Every day | ORAL | 0 refills | Status: DC

## 2018-06-10 NOTE — Telephone Encounter (Signed)
Left msg advising pt that refill sent to pharmacy

## 2018-06-10 NOTE — Telephone Encounter (Signed)
Spoke to pharmacist at [facility], althoug out system looks like 3 RXs were sent back in May, they only received one RX which was filled 04-16-18 and another that was filled 05-16-18.   Pt requesting RF.  RX pended, please send if appropriate

## 2018-06-19 ENCOUNTER — Ambulatory Visit: Payer: BLUE CROSS/BLUE SHIELD | Admitting: Sports Medicine

## 2018-06-19 ENCOUNTER — Encounter: Payer: Self-pay | Admitting: Sports Medicine

## 2018-06-19 DIAGNOSIS — M7021 Olecranon bursitis, right elbow: Secondary | ICD-10-CM | POA: Diagnosis not present

## 2018-06-19 NOTE — Assessment & Plan Note (Signed)
Traumatic olecranon bursitis, aspiration and injection. Strapped with compressive dressing.   Return in 1 month.

## 2018-06-19 NOTE — Progress Notes (Signed)
Subjective:    I'm seeing this patient as a consultation for: Dr. Nani Gasser  CC: Right elbow swelling  HPI: 6 weeks ago this pleasant 57 year old female bumped her right elbow against a door, she had immediate pain, the pain mostly resolved but swelling continued.  Moderate, persistent without radiation.  I reviewed the past medical history, family history, social history, surgical history, and allergies today and no changes were needed.  Please see the problem list section below in epic for further details.  Past Medical History: Past Medical History:  Diagnosis Date  . Arthritis    osteo  . Asthma   . Depression   . GERD (gastroesophageal reflux disease)    Past Surgical History: Past Surgical History:  Procedure Laterality Date  . BACK SURGERY  2001  . BLADDER SUSPENSION    . BREAST ENHANCEMENT SURGERY Left 1879  . CARPAL TUNNEL RELEASE Right 11/01/2016   Procedure: CARPAL TUNNEL RELEASE;  Surgeon: Jodi Geralds, MD;  Location: [facility];  Service: Orthopedics;  Laterality: Right;  . HAND SURGERY Left 2013   for fracture, [facility] [facility]  . NECK SURGERY    . ORIF WRIST FRACTURE Right 11/01/2016   Procedure: OPEN REDUCTION INTERNAL FIXATION (ORIF) WRIST FRACTURE;  Surgeon: Jodi Geralds, MD;  Location: [facility];  Service: Orthopedics;  Laterality: Right;  . SHOULDER SURGERY  2005   Social History: Social History   Socioeconomic History  . Marital status: Married    Spouse name: Molly Maduro  . Number of children: 2  . Years of education: Not on file  . Highest education level: Not on file  Occupational History  . Occupation: Evidence Tech    Comment: [place] [employer]  Social Needs  . Financial resource strain: Not on file  . Food insecurity:    Worry: Not on file    Inability: Not on file  . Transportation needs:    Medical: Not on file    Non-medical: Not on file  Tobacco Use  . Smoking status: Current Every Day Smoker   Packs/day: 1.00    Years: 40.00    Pack years: 40.00  . Smokeless tobacco: Never Used  Substance and Sexual Activity  . Alcohol use: No  . Drug use: Not on file  . Sexual activity: Not on file  Lifestyle  . Physical activity:    Days per week: Not on file    Minutes per session: Not on file  . Stress: Not on file  Relationships  . Social connections:    Talks on phone: Not on file    Gets together: Not on file    Attends religious service: Not on file    Active member of club or organization: Not on file    Attends meetings of clubs or organizations: Not on file    Relationship status: Not on file  Other Topics Concern  . Not on file  Social History Narrative   Some college.  3 caffeine per day.  Walks 3 times per week for exercise.    Family History: Family History  Problem Relation Age of Onset  . Coronary artery disease Father   . Lung cancer Father        mesothelioma  . Hyperlipidemia Father   . Alcohol abuse Unknown        uncle  . Breast cancer Mother   . Kidney failure Mother   . Hyperlipidemia Mother   . Bipolar disorder Daughter   . Depression  Daughter    Allergies: No Known Allergies Medications: See med rec.  Review of Systems: No headache, visual changes, nausea, vomiting, diarrhea, constipation, dizziness, abdominal pain, skin rash, fevers, chills, night sweats, weight loss, swollen lymph nodes, body aches, joint swelling, muscle aches, chest pain, shortness of breath, mood changes, visual or auditory hallucinations.   Objective:   General: Well Developed, well nourished, and in no acute distress.  Neuro:  Extra-ocular muscles intact, able to move all 4 extremities, sensation grossly intact.  Deep tendon reflexes tested were normal. Psych: Alert and oriented, mood congruent with affect. ENT:  Ears and nose appear unremarkable.  Hearing grossly normal. Neck: Unremarkable overall appearance, trachea midline.  No visible thyroid enlargement. Eyes:  Conjunctivae and lids appear unremarkable.  Pupils equal and round. Skin: Warm and dry, no rashes noted.  Cardiovascular: Pulses palpable, no extremity edema. Right elbow: Visible, swollen, nontender olecranon bursa Range of motion full pronation, supination, flexion, extension. Strength is full to all of the above directions Stable to varus, valgus stress. Negative moving valgus stress test. No discrete areas of tenderness to palpation. Ulnar nerve does not sublux. Negative cubital tunnel Tinel's.  Procedure: Real-time Ultrasound Guided aspiration/injection of right olecranon bursitis Device: GE Logiq E  Verbal informed consent obtained.  Time-out conducted.  Noted no overlying erythema, induration, or other signs of local infection.  Skin prepped in a sterile fashion.  Local anesthesia: Topical Ethyl chloride.  With sterile technique and under real time ultrasound guidance: Using an 18-gauge needle I aspirated approximately 4 to 5 mL of serosanguineous fluid, syringe switched and 1 cc kenalog 40 injected easily Completed without difficulty  Pain immediately resolved suggesting accurate placement of the medication.  Advised to call if fevers/chills, erythema, induration, drainage, or persistent bleeding.  Images permanently stored and available for review in the ultrasound unit.  Impression: Technically successful ultrasound guided injection.  Impression and Recommendations:   This case required medical decision making of moderate complexity.  Olecranon bursitis, right elbow Traumatic olecranon bursitis, aspiration and injection. Strapped with compressive dressing.   Return in 1 month. ___________________________________________ Ihor Austinhomas J. Benjamin Stainhekkekandam, M.D., ABFM., CAQSM. Primary Care and Sports Medicine [facility]  Adjunct Instructor of Family Medicine  [employer]

## 2018-07-03 ENCOUNTER — Other Ambulatory Visit: Payer: Self-pay | Admitting: Family Medicine

## 2018-07-08 ENCOUNTER — Other Ambulatory Visit: Payer: Self-pay | Admitting: Family Medicine

## 2018-07-09 MED ORDER — LISDEXAMFETAMINE DIMESYLATE 70 MG PO CAPS: 70.0000 mg | ORAL_CAPSULE | Freq: Every day | ORAL | 0 refills | Status: DC

## 2018-07-19 ENCOUNTER — Other Ambulatory Visit: Payer: Self-pay | Admitting: Family Medicine

## 2018-07-19 DIAGNOSIS — I10 Essential (primary) hypertension: Secondary | ICD-10-CM

## 2018-07-20 ENCOUNTER — Ambulatory Visit: Payer: BLUE CROSS/BLUE SHIELD | Admitting: Sports Medicine

## 2018-07-26 ENCOUNTER — Other Ambulatory Visit: Payer: Self-pay | Admitting: Family Medicine

## 2018-08-10 ENCOUNTER — Encounter: Payer: Self-pay | Admitting: Family Medicine

## 2018-08-10 MED ORDER — LISDEXAMFETAMINE DIMESYLATE 70 MG PO CAPS: 70.0000 mg | ORAL_CAPSULE | Freq: Every day | ORAL | 0 refills | Status: DC

## 2018-08-18 ENCOUNTER — Ambulatory Visit: Payer: BLUE CROSS/BLUE SHIELD | Admitting: Family Medicine

## 2018-09-07 ENCOUNTER — Other Ambulatory Visit: Payer: Self-pay | Admitting: Family Medicine

## 2018-09-07 ENCOUNTER — Encounter: Payer: Self-pay | Admitting: Family Medicine

## 2018-09-07 MED ORDER — LISDEXAMFETAMINE DIMESYLATE 70 MG PO CAPS: 70.0000 mg | ORAL_CAPSULE | Freq: Every day | ORAL | 0 refills | Status: DC

## 2018-09-14 ENCOUNTER — Encounter: Payer: Self-pay | Admitting: Family Medicine

## 2018-09-14 ENCOUNTER — Ambulatory Visit: Payer: BLUE CROSS/BLUE SHIELD | Admitting: Family Medicine

## 2018-09-14 VITALS — BP 112/61 | HR 70 | Ht 67.0 in | Wt 144.0 lb

## 2018-09-14 DIAGNOSIS — F902 Attention-deficit hyperactivity disorder, combined type: Secondary | ICD-10-CM

## 2018-09-14 DIAGNOSIS — I10 Essential (primary) hypertension: Secondary | ICD-10-CM

## 2018-09-14 DIAGNOSIS — Z23 Encounter for immunization: Secondary | ICD-10-CM

## 2018-09-14 DIAGNOSIS — E7800 Pure hypercholesterolemia, unspecified: Secondary | ICD-10-CM | POA: Diagnosis not present

## 2018-09-14 MED ORDER — LISDEXAMFETAMINE DIMESYLATE 70 MG PO CAPS: 70.0000 mg | ORAL_CAPSULE | Freq: Every day | ORAL | 0 refills | Status: DC

## 2018-09-14 NOTE — Progress Notes (Signed)
Subjective:    CC: BP, ADHD  HPI:  Hypertension- Pt denies chest pain, SOB, dizziness, or heart palpitations.  Taking meds as directed w/o problems.  Denies medication side effects.    ADHD - Reports symptoms are well controlled on current regime. Denies any problems with insomnia, chest pain, palpitations, or SOB.    Hyperlipidemia-currently on lovastatin tolerating well without any side effects or problems.  Past medical history, Surgical history, Family history not pertinant except as noted below, Social history, Allergies, and medications have been entered into the medical record, reviewed, and corrections made.   Review of Systems: No fevers, chills, night sweats, weight loss, chest pain, or shortness of breath.   Objective:    General: Well Developed, well nourished, and in no acute distress.  Neuro: Alert and oriented x3, extra-ocular muscles intact, sensation grossly intact.  HEENT: Normocephalic, atraumatic  Skin: Warm and dry, no rashes. Cardiac: Regular rate and rhythm, no murmurs rubs or gallops, no lower extremity edema.  Respiratory: Clear to auscultation bilaterally. Not using accessory muscles, speaking in full sentences.   Impression and Recommendations:    HTN - Well controlled. Continue current regimen. Follow up in  6months.      ADHD  -doing well on current regimen.  Refills sent.  Follow-up in 4 months.  Hyperlipidemia-due to recheck lipid panel.  Continue with statin.

## 2018-09-26 ENCOUNTER — Other Ambulatory Visit: Payer: Self-pay | Admitting: Family Medicine

## 2018-10-29 ENCOUNTER — Ambulatory Visit (INDEPENDENT_AMBULATORY_CARE_PROVIDER_SITE_OTHER): Payer: BLUE CROSS/BLUE SHIELD

## 2018-10-29 ENCOUNTER — Other Ambulatory Visit: Payer: Self-pay | Admitting: Family Medicine

## 2018-10-29 ENCOUNTER — Ambulatory Visit: Payer: BLUE CROSS/BLUE SHIELD | Admitting: Family Medicine

## 2018-10-29 ENCOUNTER — Encounter: Payer: Self-pay | Admitting: Family Medicine

## 2018-10-29 VITALS — BP 122/45 | HR 83

## 2018-10-29 DIAGNOSIS — W19XXXA Unspecified fall, initial encounter: Secondary | ICD-10-CM | POA: Diagnosis not present

## 2018-10-29 DIAGNOSIS — S8992XA Unspecified injury of left lower leg, initial encounter: Secondary | ICD-10-CM | POA: Diagnosis not present

## 2018-10-29 DIAGNOSIS — M85861 Other specified disorders of bone density and structure, right lower leg: Secondary | ICD-10-CM

## 2018-10-29 DIAGNOSIS — M21371 Foot drop, right foot: Secondary | ICD-10-CM

## 2018-10-29 DIAGNOSIS — R531 Weakness: Secondary | ICD-10-CM

## 2018-10-29 DIAGNOSIS — I10 Essential (primary) hypertension: Secondary | ICD-10-CM | POA: Diagnosis not present

## 2018-10-29 DIAGNOSIS — R7989 Other specified abnormal findings of blood chemistry: Secondary | ICD-10-CM | POA: Diagnosis not present

## 2018-10-29 DIAGNOSIS — S82122A Displaced fracture of lateral condyle of left tibia, initial encounter for closed fracture: Secondary | ICD-10-CM | POA: Diagnosis not present

## 2018-10-29 DIAGNOSIS — M21372 Foot drop, left foot: Secondary | ICD-10-CM

## 2018-10-29 DIAGNOSIS — R258 Other abnormal involuntary movements: Secondary | ICD-10-CM

## 2018-10-29 DIAGNOSIS — M6281 Muscle weakness (generalized): Secondary | ICD-10-CM | POA: Diagnosis not present

## 2018-10-29 DIAGNOSIS — M2578 Osteophyte, vertebrae: Secondary | ICD-10-CM | POA: Diagnosis not present

## 2018-10-29 DIAGNOSIS — D649 Anemia, unspecified: Secondary | ICD-10-CM | POA: Diagnosis not present

## 2018-10-29 DIAGNOSIS — S82142A Displaced bicondylar fracture of left tibia, initial encounter for closed fracture: Secondary | ICD-10-CM | POA: Diagnosis not present

## 2018-10-29 DIAGNOSIS — S8991XA Unspecified injury of right lower leg, initial encounter: Secondary | ICD-10-CM | POA: Diagnosis not present

## 2018-10-29 MED ORDER — AMBULATORY NON FORMULARY MEDICATION: 0 refills | Status: DC

## 2018-10-29 MED ORDER — TRAMADOL HCL 50 MG PO TABS: ORAL_TABLET | ORAL | 0 refills | Status: DC

## 2018-10-29 NOTE — Progress Notes (Addendum)
Glenda Fernandez is a 57 y.o. female who presents to [facility] today for left knee pain after a fall. Glenda Fernandez was at an [employer] today, when her foot got stuck on some carpet, and she fell down onto concrete. She does not think she hit her knee, but felt a twisting motion of her left knee "in a direction it's not supposed to go". She could not walk or get up afterwards. She has taken Advil and Aleve and used ice.   For the past month, Glenda Fernandez has had difficulty walking and weakness. She first noted feeling like there was a disconnect between her brain telling her legs to move and her legs not moving. She feels like she can't pick up her feet and she and her daughter note that she's been "shuffling". At times, she has picked up her legs high so her feet don't drag on the ground.   Also, she has had tingling in her left shoulder and numbness in both hands. She has dropped 6 coffee cups out of her left hand due to weakness. She has weakness when lifting her arms overhead, so she has been bending her head down to wash her hair. She also notes difficulty writing with her right hand.   She denies any injury leading up to the weakness described above over the last month or so.  Symptoms have started gradually and slowly progressively worsening.  No significant rash fevers night sweats.  ROS:  No headache, visual changes, nausea, vomiting, diarrhea, constipation, dizziness, abdominal pain, skin rash, fevers, chills, night sweats, weight loss, swollen lymph nodes, body aches, joint swelling, muscle aches, chest pain, shortness of breath, mood changes, visual or auditory hallucinations.    Exam:  BP (!) 122/45   Pulse 83  General: Well Developed, well nourished, and in no acute distress.  Neuro/Psych: Alert and oriented x3, extra-ocular muscles intact, sensation grossly intact. See below Skin: Warm and dry, no rashes noted.  Respiratory: Not using  accessory muscles, speaking in full sentences, trachea midline.  Cardiovascular: Pulses palpable, no extremity edema. Abdomen: Does not appear distended. MSK:  Cervical spine:  No erythma or rashes on cervical spine. No pain to palpation over cervical spine midline. Tender to palpation along trapezius bilaterally.  Somewhat limited range of motion in all directions due to pain.  Strength with resisted shoulder abduction 4/5 bilaterally Biceps and triceps strength 5/5 bilaterally.  Hand and wrist extension 5/5 bilaterally Finger abduction 4/5 bilaterally. Finger adduction 4/5 on left and 5/5 on right. Hand grip 4/5 on left and 5/5 on right. Reflexes equal and normal bilaterally. Negative Hoffman sign bilaterally  L-spine: Nontender to midline range of motion difficult to test due to patient's inability to ambulate due to pain Hip flexion intact bilaterally 5/5. Hip abduction 5/5 right test left. Knee extension 5/5 right left to painful test Knee flexion 5/5 right left difficult to test Foot dorsiflexion 4/5 right space patient experiences significant knee pain with foot dorsiflexion of the left and unable to test Foot plantarflexion 5/5 right left 5/5 Great toe dorsiflexion 4+/5 right 5/5 left Reflexes intact right knee and bilateral legs Clonus present right foot plantarflexion for approximately 4 beats.  Patient guards with left foot testing  Right knee:  No abnormalities noted on inspection. No pain to palpation of joint line. Normal ROM.   Left knee: Normal-appearing no significant erythema Range of motion 5-100 degrees with pain Diffusely tender however not particularly point tender  anywhere Patient guards with ligament testing Strength is present to extension and flexion however patient has significant guarding  Capillary refill and sensation are intact distally.    Lab and Radiology Results X-ray images personally independently reviewed Left knee: No obvious acute  fracture however the more distal tibia is not well visualized.  Moderate degenerative changes present.  No lytic lesions present.  C-spine: Diffuse degenerative changes without acute fracture.  Await formal radiology review   Addendum: X-ray formal radiology review did show a tibial plateau fracture.  Patient was treated with knee immobilizer and nonweightbearing.  Additionally on November 18 MRI C-spine shows significant cord compression.  Patient scheduled with neurosurgery on November 19  Results for orders placed or performed in visit on 10/29/18 (from the past 72 hour(s))  CBC     Status: Abnormal   Collection Time: 10/29/18  4:18 PM  Result Value Ref Range   WBC 10.8 3.8 - 10.8 Thousand/uL   RBC 3.50 (L) 3.80 - 5.10 Million/uL   Hemoglobin 9.7 (L) 11.7 - 15.5 g/dL   HCT 16.1 (L) 09.6 - 04.5 %   MCV 82.3 80.0 - 100.0 fL   MCH 27.7 27.0 - 33.0 pg   MCHC 33.7 32.0 - 36.0 g/dL   RDW 40.9 81.1 - 91.4 %   Platelets 404 (H) 140 - 400 Thousand/uL   MPV 10.1 7.5 - 12.5 fL  COMPLETE METABOLIC PANEL WITH GFR     Status: Abnormal   Collection Time: 10/29/18  4:18 PM  Result Value Ref Range   Glucose, Bld 130 (H) 65 - 99 mg/dL    Comment: .            Fasting reference interval . For someone without known diabetes, a glucose value >125 mg/dL indicates that they may have diabetes and this should be confirmed with a follow-up test. .    BUN 27 (H) 7 - 25 mg/dL   Creat 7.82 (H) 9.56 - 1.05 mg/dL    Comment: For patients >49 years of age, the reference limit for Creatinine is approximately 13% higher for people identified as African-American. .    GFR, Est Non African American 41 (L) > OR = 60 mL/min/1.53m2   GFR, Est African American 48 (L) > OR = 60 mL/min/1.57m2   BUN/Creatinine Ratio 19 6 - 22 (calc)   Sodium 137 135 - 146 mmol/L   Potassium 3.8 3.5 - 5.3 mmol/L   Chloride 103 98 - 110 mmol/L   CO2 23 20 - 32 mmol/L   Calcium 8.9 8.6 - 10.4 mg/dL   Total Protein 6.9 6.1  - 8.1 g/dL   Albumin 4.1 3.6 - 5.1 g/dL   Globulin 2.8 1.9 - 3.7 g/dL (calc)   AG Ratio 1.5 1.0 - 2.5 (calc)   Total Bilirubin 0.2 0.2 - 1.2 mg/dL   Alkaline phosphatase (APISO) 72 33 - 130 U/L   AST 16 10 - 35 U/L   ALT 11 6 - 29 U/L  Sedimentation rate     Status: Abnormal   Collection Time: 10/29/18  4:18 PM  Result Value Ref Range   Sed Rate 62 (H) 0 - 30 mm/h  CK     Status: Abnormal   Collection Time: 10/29/18  4:18 PM  Result Value Ref Range   Total CK 184 (H) 29 - 143 U/L  TSH     Status: None   Collection Time: 10/29/18  4:18 PM  Result Value Ref Range   TSH 0.55  0.40 - 4.50 mIU/L   No results found.      Assessment and Plan: 57 y.o. female with  1 month gradual onset bilateral upper and lower extremity weakness.  This is highly concerning for cervical spinal compression.  Patient has clonus present in the right foot which is brain or spinal cord injury or compression.  Additionally however metabolic work-up listed above obtained after patient left the clinic is also concerning for muscular condition.  She has elevated CK elevated sed rate.  Additionally she has other laboratory derangements including anemia with a hemoglobin less than 10 with unclear baseline and worsening creatinine of now 1.4 up from her baseline of 0.94 January 2019.   Additionally likely due to the weakness especially with foot dorsiflexion she suffered a fall and injured her left knee per my read I do not see an obvious fracture of her left knee however she is obviously in pain and has lots of guarding and dysfunction today.  Plan for a walker, knee immobilizer, and limited weightbearing.  Will treat pain acutely with tramadol and recheck shortly.  I will evaluate the in my opinion more concerning symptoms of bilateral upper and lower extremity weakness and right foot clonus with x-ray as described above and prompt MRI of C-spine patient is already scheduled for Monday November 18th.  Treat empirically  now with prednisone burst for possible cord compression I will continue to follow-up abnormal laboratory findings.  Will broaden work-up and evaluate for other etiologies described above.  Additionally will discuss with PCP to come up with a more comprehensive plan to evaluate for the acute kidney changes, new anemia, and elevated inflammatory markers.    Orders Placed This Encounter  Procedures  . DG Knee Complete 4 Views Left    Standing Status:   Future    Number of Occurrences:   1    Standing Expiration Date:   12/30/2019    Order Specific Question:   Reason for Exam (SYMPTOM  OR DIAGNOSIS REQUIRED)    Answer:   knee injury    Order Specific Question:   Is patient pregnant?    Answer:   No    Order Specific Question:   Preferred imaging location?    Answer:   [facility]    Order Specific Question:   Radiology Contrast Protocol - do NOT remove file path    Answer:   \\[email or web address]\epicdata\Radiant\DXFluoroContrastProtocols.pdf  . MR Cervical Spine Wo Contrast    Standing Status:   Future    Standing Expiration Date:   12/30/2019    Order Specific Question:   What is the patient's sedation requirement?    Answer:   No Sedation    Order Specific Question:   Does the patient have a pacemaker or implanted devices?    Answer:   No    Order Specific Question:   Preferred imaging location?    Answer:   [facility] (table limit-350lbs)    Order Specific Question:   Radiology Contrast Protocol - do NOT remove file path    Answer:   \\[email or web address]\epicdata\Radiant\mriPROTOCOL.PDF  . DG Cervical Spine Complete    Standing Status:   Future    Number of Occurrences:   1    Standing Expiration Date:   12/30/2019    Order Specific Question:   Reason for Exam (SYMPTOM  OR DIAGNOSIS REQUIRED)    Answer:   BL UE weakness and LE weakness and foot drop    Order Specific Question:   Is  patient pregnant?    Answer:   No    Order Specific Question:   [facility]?     [facility]    Order Specific Question:   Radiology Contrast Protocol - do NOT remove file path    Answer:   \\[email or web address]\Radiant\DXFluoroContrastProtocols.pdf  . DG Lumbar Spine Complete    Standing Status:   Future    Standing Expiration Date:   12/30/2019    Order Specific Question:   Reason for Exam (SYMPTOM  OR DIAGNOSIS REQUIRED)    Answer:   eval BL LE weakness    Order Specific Question:   Is patient pregnant?    Answer:   No    Order Specific Question:   Preferred imaging location?    [facility]    Order Specific Question:   Radiology Contrast Protocol - do NOT remove file path    Answer:   \\[email or web address]\epicdata\Radiant\DXFluoroContrastProtocols.pdf  . CBC  . COMPLETE METABOLIC PANEL WITH GFR  . RPR  . Sedimentation rate  . CK  . TSH  . HIV antibody (with reflex)   Meds ordered this encounter  Medications  . AMBULATORY NON FORMULARY MEDICATION    Sig: Walker use as needed disp 1. Leg weakness R53.1    Dispense:  1 each    Refill:  0  . traMADol (ULTRAM) 50 MG tablet    Sig: 1-2 tabs by mouth Q8 hours, maximum 6 tabs per day.    Dispense:  10 tablet    Refill:  0  . predniSONE (DELTASONE) 50 MG tablet    Sig: Take 1 tablet (50 mg total) by mouth daily.    Dispense:  5 tablet    Refill:  0    Historical information moved to improve visibility of documentation.  Past Medical History:  Diagnosis Date  . Arthritis    osteo  . Asthma   . Depression   . GERD (gastroesophageal reflux disease)    Past Surgical History:  Procedure Laterality Date  . BACK SURGERY  2001  . BLADDER SUSPENSION    . BREAST ENHANCEMENT SURGERY Left 1879  . CARPAL TUNNEL RELEASE Right 11/01/2016   Procedure: CARPAL TUNNEL RELEASE;  Surgeon: Jodi GeraldsJohn Graves, MD;  Location: [facility];  Service: Orthopedics;  Laterality: Right;  . HAND SURGERY Left 2013   for fracture, [place] [facility]  . NECK SURGERY    . ORIF WRIST FRACTURE Right  11/01/2016   Procedure: OPEN REDUCTION INTERNAL FIXATION (ORIF) WRIST FRACTURE;  Surgeon: Jodi GeraldsJohn Graves, MD;  Location:  SURGERY CENTER;  Service: Orthopedics;  Laterality: Right;  . SHOULDER SURGERY  2005   Social History   Tobacco Use  . Smoking status: Current Every Day Smoker    Packs/day: 1.00    Years: 40.00    Pack years: 40.00  . Smokeless tobacco: Never Used  Substance Use Topics  . Alcohol use: No   family history includes Alcohol abuse in her unknown relative; Bipolar disorder in her daughter; Breast cancer in her mother; Coronary artery disease in her father; Depression in her daughter; Hyperlipidemia in her father and mother; Kidney failure in her mother; Lung cancer in her father.  Medications: Current Outpatient Medications  Medication Sig Dispense Refill  . albuterol (PROVENTIL HFA;VENTOLIN HFA) 108 (90 Base) MCG/ACT inhaler Inhale 2 puffs into the lungs every 6 (six) hours as needed for wheezing or shortness of breath. 8.5 Inhaler 2  . FLUoxetine (PROZAC) 40 MG capsule  TAKE 2 CAPSULES (80 MG TOTAL) BY MOUTH DAILY. 180 capsule 1  . hydrOXYzine (ATARAX/VISTARIL) 10 MG tablet TAKE 1-2 TABLETS (10-20 MG TOTAL) BY MOUTH EVERY 8 (EIGHT) HOURS AS NEEDED FOR ANXIETY. 45 tablet 3  . ipratropium-albuterol (DUONEB) 0.5-2.5 (3) MG/3ML SOLN TAKE 3 MLS BY NEBULIZATION EVERY 4 (FOUR) HOURS AS NEEDED. 360 mL 0  . [START ON 11/05/2018] lisdexamfetamine (VYVANSE) 70 MG capsule Take 1 capsule (70 mg total) by mouth daily. 30 capsule 0  . lisdexamfetamine (VYVANSE) 70 MG capsule Take 1 capsule (70 mg total) by mouth daily. 30 capsule 0  . [START ON 12/04/2018] lisdexamfetamine (VYVANSE) 70 MG capsule Take 1 capsule (70 mg total) by mouth daily. 30 capsule 0  . lisinopril-hydrochlorothiazide (PRINZIDE,ZESTORETIC) 20-25 MG tablet TAKE 1 TABLET BY MOUTH DAILY. 90 tablet 1  . lovastatin (MEVACOR) 40 MG tablet TAKE 1 TABLET BY MOUTH EVERYDAY AT BEDTIME 90 tablet 3  . ranitidine (ZANTAC)  150 MG tablet TAKE 1 TABLET (150 MG TOTAL) BY MOUTH 2 (TWO) TIMES DAILY. 180 tablet 1  . AMBULATORY NON FORMULARY MEDICATION Walker use as needed disp 1. Leg weakness R53.1 1 each 0  . predniSONE (DELTASONE) 50 MG tablet Take 1 tablet (50 mg total) by mouth daily. 5 tablet 0  . traMADol (ULTRAM) 50 MG tablet 1-2 tabs by mouth Q8 hours, maximum 6 tabs per day. 10 tablet 0   No current facility-administered medications for this visit.    No Known Allergies    Discussed warning signs or symptoms. Please see discharge instructions. Patient expresses understanding.  I personally was present and performed or re-performed the history, physical exam and medical decision-making activities of this service and have verified that the service and findings are accurately documented in the student's note. ___________________________________________ Clementeen Graham M.D., ABFM., CAQSM. Primary Care and Sports Medicine Adjunct Instructor of Family Medicine  [employer]

## 2018-10-29 NOTE — Patient Instructions (Signed)
Thank you for coming in today. Use the knee immobilizer.  Use the walker.  MRI will likely be MOnday.  Recheck with me Tyesday or Wednesday.  Get labs and xray now.   we Weakness Weakness is a lack of strength. You may feel weak all over your body (generalized), or you may feel weak in one specific part of your body (focal). There are many potential causes of weakness. Sometimes, the cause of your weakness may not be known. Some causes of weakness can be serious, so it is important to see your doctor. Follow these instructions at home:  Rest as needed.  Try to get enough sleep. Talk to your doctor about how much sleep you need each night.  Take over-the-counter and prescription medicines only as told by your doctor.  Eat a healthy, well-balanced diet. This includes: ? Proteins to build muscles, such as lean meats and fish. ? Fresh fruits and vegetables. ? Carbohydrates to boost energy, such as whole grains.  Drink enough fluid to keep your pee (urine) clear or pale yellow.  Do strength exercises, such as arm curls and leg raises, for 30 minutes at least 2 days a week or as told by your doctor.  Think about working with a physical therapist or trainer to help you get stronger.  Keep all follow-up visits as told by your doctor. This is important. Contact a doctor if:  Your weakness does not get better or it gets worse.  Your weakness affects your ability to: ? Think clearly. ? Do your normal daily activities. Get help right away if:  You have sudden weakness.  You have trouble breathing or shortness of breath.  You have problems with your vision.  You have trouble talking or swallowing.  You have trouble standing or walking.  You have chest pain.  You are light-headed.  You pass out (lose consciousness). This information is not intended to replace advice given to you by your health care provider. Make sure you discuss any questions you have with your health care  provider. Document Released: 11/14/2008 Document Revised: 12/28/2015 Document Reviewed: 09/22/2015 Elsevier Interactive Patient Education  [employer]2018 [employer]

## 2018-10-30 ENCOUNTER — Encounter: Payer: Self-pay | Admitting: Family Medicine

## 2018-10-30 ENCOUNTER — Telehealth: Payer: Self-pay | Admitting: Family Medicine

## 2018-10-30 MED ORDER — HYDROCODONE-ACETAMINOPHEN 5-325 MG PO TABS: 1.0000 | ORAL_TABLET | Freq: Four times a day (QID) | ORAL | 0 refills | Status: DC | PRN

## 2018-10-30 MED ORDER — PREDNISONE 50 MG PO TABS: 50.0000 mg | ORAL_TABLET | Freq: Every day | ORAL | 0 refills | Status: DC

## 2018-10-30 NOTE — Addendum Note (Signed)
Addended by: Rodolph BongOREY, Betsy Rosello S on: 10/30/2018 06:54 AM   Modules accepted: Orders

## 2018-10-30 NOTE — Telephone Encounter (Signed)
Patient has been advised. Rhonda Cunningham,CMA  

## 2018-10-30 NOTE — Telephone Encounter (Signed)
Called patient back regarding knee x-ray results.  X-ray showed plateau.  She was treated for this as a precaution yesterday with knee immobilizer and nonweightbearing.  Discussed results.  She notes the tramadol is not sufficient to control her pain.  Hydrocodone sent in.  Will reassess patient after MRI next week.

## 2018-10-30 NOTE — Telephone Encounter (Signed)
I sent a prescription for prednisone to the pharmacy.  This should help with some of the swelling that I think is happening around the spinal cord.  Take 50 mg pills 1 daily for 5 days.

## 2018-11-02 ENCOUNTER — Other Ambulatory Visit: Payer: BLUE CROSS/BLUE SHIELD

## 2018-11-02 ENCOUNTER — Ambulatory Visit (INDEPENDENT_AMBULATORY_CARE_PROVIDER_SITE_OTHER): Payer: BLUE CROSS/BLUE SHIELD

## 2018-11-02 ENCOUNTER — Telehealth: Payer: Self-pay | Admitting: Family Medicine

## 2018-11-02 DIAGNOSIS — R531 Weakness: Secondary | ICD-10-CM

## 2018-11-02 DIAGNOSIS — M21372 Foot drop, left foot: Secondary | ICD-10-CM

## 2018-11-02 DIAGNOSIS — R258 Other abnormal involuntary movements: Secondary | ICD-10-CM

## 2018-11-02 DIAGNOSIS — M5021 Other cervical disc displacement,  high cervical region: Secondary | ICD-10-CM

## 2018-11-02 DIAGNOSIS — G9520 Unspecified cord compression: Secondary | ICD-10-CM

## 2018-11-02 DIAGNOSIS — S8992XA Unspecified injury of left lower leg, initial encounter: Secondary | ICD-10-CM

## 2018-11-02 DIAGNOSIS — M21371 Foot drop, right foot: Secondary | ICD-10-CM

## 2018-11-02 DIAGNOSIS — M4802 Spinal stenosis, cervical region: Secondary | ICD-10-CM | POA: Diagnosis not present

## 2018-11-02 DIAGNOSIS — M4722 Other spondylosis with radiculopathy, cervical region: Secondary | ICD-10-CM | POA: Diagnosis not present

## 2018-11-02 LAB — COMPLETE METABOLIC PANEL WITH GFR
AG Ratio: 1.5 (calc) (ref 1.0–2.5)
ALBUMIN MSPROF: 4.1 g/dL (ref 3.6–5.1)
ALT: 11 U/L (ref 6–29)
AST: 16 U/L (ref 10–35)
Alkaline phosphatase (APISO): 72 U/L (ref 33–130)
BUN / CREAT RATIO: 19 (calc) (ref 6–22)
BUN: 27 mg/dL — AB (ref 7–25)
CALCIUM: 8.9 mg/dL (ref 8.6–10.4)
CO2: 23 mmol/L (ref 20–32)
CREATININE: 1.4 mg/dL — AB (ref 0.50–1.05)
Chloride: 103 mmol/L (ref 98–110)
GFR, EST NON AFRICAN AMERICAN: 41 mL/min/{1.73_m2} — AB (ref >=60)
GFR, Est African American: 48 mL/min/{1.73_m2} — ABNORMAL LOW (ref >=60)
Globulin: 2.8 g/dL (calc) (ref 1.9–3.7)
Glucose, Bld: 130 mg/dL — ABNORMAL HIGH (ref 65–99)
Potassium: 3.8 mmol/L (ref 3.5–5.3)
Sodium: 137 mmol/L (ref 135–146)
Total Bilirubin: 0.2 mg/dL (ref 0.2–1.2)
Total Protein: 6.9 g/dL (ref 6.1–8.1)

## 2018-11-02 LAB — CBC
HCT: 28.8 % — ABNORMAL LOW (ref 35.0–45.0)
Hemoglobin: 9.7 g/dL — ABNORMAL LOW (ref 11.7–15.5)
MCH: 27.7 pg (ref 27.0–33.0)
MCHC: 33.7 g/dL (ref 32.0–36.0)
MCV: 82.3 fL (ref 80.0–100.0)
MPV: 10.1 fL (ref 7.5–12.5)
PLATELETS: 404 10*3/uL — AB (ref 140–400)
RBC: 3.5 10*6/uL — AB (ref 3.80–5.10)
RDW: 14.4 % (ref 11.0–15.0)
WBC: 10.8 10*3/uL (ref 3.8–10.8)

## 2018-11-02 LAB — IRON,TIBC AND FERRITIN PANEL
%SAT: 13 % (calc) — ABNORMAL LOW (ref 16–45)
FERRITIN: 12 ng/mL — AB (ref 16–232)
IRON: 43 ug/dL — AB (ref 45–160)
TIBC: 320 ug/dL (ref 250–450)

## 2018-11-02 LAB — TEST AUTHORIZATION

## 2018-11-02 LAB — HIV ANTIBODY (ROUTINE TESTING W REFLEX): HIV: NONREACTIVE

## 2018-11-02 LAB — CK: Total CK: 184 U/L — ABNORMAL HIGH (ref 29–143)

## 2018-11-02 LAB — SEDIMENTATION RATE: SED RATE: 62 mm/h — AB (ref 0–30)

## 2018-11-02 LAB — TSH: TSH: 0.55 m[IU]/L (ref 0.40–4.50)

## 2018-11-02 LAB — RPR: RPR Ser Ql: NONREACTIVE

## 2018-11-02 NOTE — Telephone Encounter (Signed)
Called patient and gave results of MRI C-spine.  Call[facility] and [facility].

## 2018-11-03 ENCOUNTER — Encounter: Payer: Self-pay | Admitting: Family Medicine

## 2018-11-03 ENCOUNTER — Ambulatory Visit: Payer: BLUE CROSS/BLUE SHIELD | Admitting: Family Medicine

## 2018-11-03 VITALS — BP 109/52 | HR 89

## 2018-11-03 DIAGNOSIS — M5000 Cervical disc disorder with myelopathy, unspecified cervical region: Secondary | ICD-10-CM | POA: Diagnosis not present

## 2018-11-03 DIAGNOSIS — D509 Iron deficiency anemia, unspecified: Secondary | ICD-10-CM | POA: Diagnosis not present

## 2018-11-03 DIAGNOSIS — G9520 Unspecified cord compression: Secondary | ICD-10-CM

## 2018-11-03 DIAGNOSIS — S82142A Displaced bicondylar fracture of left tibia, initial encounter for closed fracture: Secondary | ICD-10-CM

## 2018-11-03 DIAGNOSIS — F1111 Opioid abuse, in remission: Secondary | ICD-10-CM

## 2018-11-03 DIAGNOSIS — N179 Acute kidney failure, unspecified: Secondary | ICD-10-CM

## 2018-11-03 HISTORY — DX: Displaced bicondylar fracture of left tibia, initial encounter for closed fracture: S82.142A

## 2018-11-03 MED ORDER — BACLOFEN 10 MG PO TABS: 10.0000 mg | ORAL_TABLET | Freq: Three times a day (TID) | ORAL | 0 refills | Status: DC

## 2018-11-03 MED ORDER — HYDROCODONE-ACETAMINOPHEN 5-325 MG PO TABS: 1.0000 | ORAL_TABLET | Freq: Four times a day (QID) | ORAL | 0 refills | Status: DC | PRN

## 2018-11-03 NOTE — Patient Instructions (Addendum)
Thank you for coming in today. I agree with Neck Surgery on  Monday.  Keep the weight off the knee.  Use norco sparingly.  Recheck with Dr Linford ArnoldMetheney about Kidney and anemia soon.   Recheck the week of Dec 2nd.   Will repeat xray knee then.

## 2018-11-03 NOTE — Progress Notes (Signed)
Glenda Fernandez is a 57 y.o. female who presents to [facility] Gl[facility]: Primary Care Sports Medicine today for follow-up upper extremity and lower extremely weakness, left knee fracture, anemia, worsening creatinine.  When he was seen on November 14 for a constellation of issues but predominantly a 1 month history of progressive bilateral upper and lower extremity weakness resulting in a fall resulting in subsequently found to be lateral tibial plateau fracture of her left knee.  She had urgent MRI of her cervical spine yesterday which showed significant cord compression explaining her weakness.  She had urgent referral to neurosurgery and had her appointment already today.  Dr. Franky Macho at [facility] plans for decompressive surgery on Monday November 25.  Glenda Fernandez has been taking the prednisone prescribed as well as hydrocodone which has helped the pain.  She is in agreement with surgery and hopeful for the future.  Additionally she was found to have a lateral tibial plateau fracture.  She was prescribed a walker when she has used.  She has been using nonweightbearing or extremely limited weightbearing which does help.  She was completely intolerant of the knee immobilizer and has switched to a hinged knee brace which she finds much more livable.  She also though she does have pain in the left lateral knee is much improved.  She has continued taking limited hydrocodone and plans to wean.  She notes that she does need a refill however.  She notes the biggest source of pain is spasms in her hamstring area she is not taking any antispasm medication.  Additionally as part of her initial laboratory assessment she was found to have unexplained anemia.  Her hemoglobin was around 9.7 and her iron stores were low with decreased percent saturation.  In retrospect Glenda Fernandez notes that she has been using a lot of Goody powder which  could explain some potential GI source of blood loss.  She denies any visible blood in the stool.  She is overdue for colonoscopy.  Additionally her creatinine had increased as well up to 1.4 from a baseline around 1.   ROS as above:  Exam:  BP (!) 109/52   Pulse 89  Wt Readings from Last 5 Encounters:  09/14/18 144 lb (65.3 kg)  06/19/18 139 lb (63 kg)  04/16/18 142 lb (64.4 kg)  02/16/18 142 lb (64.4 kg)  12/18/17 138 lb (62.6 kg)    Gen: Well NAD HEENT: EOMI,  MMM Lungs: Normal work of breathing. CTABL Heart: RRR no MRG Abd: NABS, Soft. Nondistended, Nontender Exts: Brisk capillary refill, warm and well perfused.  Left knee relatively normal-appearing mildly tender palpation tibial plateau.  Motion not tested.  Stability not tested.  Sensation is intact distally.  Lab and Radiology Results No results found for this or any previous visit (from the past 72 hour(s)). Mr Cervical Spine Wo Contrast  Result Date: 11/02/2018 CLINICAL DATA:  Cervical radiculopathy.  Minor trauma.  Recent fall. EXAM: MRI CERVICAL SPINE WITHOUT CONTRAST TECHNIQUE: Multiplanar, multisequence MR imaging of the cervical spine was performed. No intravenous contrast was administered. COMPARISON:  Cervical spine radiographs 10/29/2018 FINDINGS: Alignment: 2 mm retrolisthesis C3-4. Vertebrae: Negative for fracture or mass lesion Cord: Ill-defined cord hyperintensity at the C3 level with associated moderate spinal stenosis. This could be due to recent cord injury. Remaining cord signal normal Posterior Fossa, vertebral arteries, paraspinal tissues: Negative Disc levels: C1-2: Moderate disc degeneration. Pannus posterior to the dens without significant spinal stenosis C2-3: Moderate  right foraminal encroachment due to uncinate spurring. C3-4: 2 mm retrolisthesis. Broad-based disc protrusion with associated spurring. Cord flattening with moderate spinal stenosis. Ill-defined cord hyperintensity could be due to acute  injury. Moderate foraminal stenosis bilaterally C4-5: Severe right foraminal encroachment due to uncinate spurring and severe facet hypertrophy. Mild spinal stenosis. C5-6: Disc degeneration and diffuse uncinate spurring. Mild spinal stenosis and mild foraminal stenosis bilaterally C6-7: Moderate right foraminal encroachment due to spurring C7-T1: Negative T1-2: Central disc protrusion without stenosis IMPRESSION: Moderate spinal stenosis at C3-4. Broad-based disc protrusion and spurring. Ill-defined cord hyperintensity may be due to acute injury. Mild spinal stenosis C4-5 with severe right foraminal encroachment due to spurring. Mild spinal stenosis and mild foraminal stenosis bilaterally C5-6 Moderate right foraminal stenosis C6-7 These results will be called to the ordering clinician or representative by the Radiologist Assistant, and communication documented in the PACS or zVision Dashboard. Electronically Signed   By: Marlan Palau M.D.   On: 11/02/2018 13:22   I personally (independently) visualized and performed the interpretation of the images attached in this note.  Recent Results (from the past 2160 hour(s))  CBC     Status: Abnormal   Collection Time: 10/29/18  4:18 PM  Result Value Ref Range   WBC 10.8 3.8 - 10.8 Thousand/uL   RBC 3.50 (L) 3.80 - 5.10 Million/uL   Hemoglobin 9.7 (L) 11.7 - 15.5 g/dL   HCT 16.1 (L) 09.6 - 04.5 %   MCV 82.3 80.0 - 100.0 fL   MCH 27.7 27.0 - 33.0 pg   MCHC 33.7 32.0 - 36.0 g/dL   RDW 40.9 81.1 - 91.4 %   Platelets 404 (H) 140 - 400 Thousand/uL   MPV 10.1 7.5 - 12.5 fL  COMPLETE METABOLIC PANEL WITH GFR     Status: Abnormal   Collection Time: 10/29/18  4:18 PM  Result Value Ref Range   Glucose, Bld 130 (H) 65 - 99 mg/dL    Comment: .            Fasting reference interval . For someone without known diabetes, a glucose value >125 mg/dL indicates that they may have diabetes and this should be confirmed with a follow-up test. .    BUN 27 (H) 7 -  25 mg/dL   Creat 7.82 (H) 9.56 - 1.05 mg/dL    Comment: For patients >68 years of age, the reference limit for Creatinine is approximately 13% higher for people identified as African-American. .    GFR, Est Non African American 41 (L) > OR = 60 mL/min/1.74m2   GFR, Est African American 48 (L) > OR = 60 mL/min/1.55m2   BUN/Creatinine Ratio 19 6 - 22 (calc)   Sodium 137 135 - 146 mmol/L   Potassium 3.8 3.5 - 5.3 mmol/L   Chloride 103 98 - 110 mmol/L   CO2 23 20 - 32 mmol/L   Calcium 8.9 8.6 - 10.4 mg/dL   Total Protein 6.9 6.1 - 8.1 g/dL   Albumin 4.1 3.6 - 5.1 g/dL   Globulin 2.8 1.9 - 3.7 g/dL (calc)   AG Ratio 1.5 1.0 - 2.5 (calc)   Total Bilirubin 0.2 0.2 - 1.2 mg/dL   Alkaline phosphatase (APISO) 72 33 - 130 U/L   AST 16 10 - 35 U/L   ALT 11 6 - 29 U/L  RPR     Status: None   Collection Time: 10/29/18  4:18 PM  Result Value Ref Range   RPR Ser Ql NON-REACTIVE NON-REACTI  Sedimentation rate     Status: Abnormal   Collection Time: 10/29/18  4:18 PM  Result Value Ref Range   Sed Rate 62 (H) 0 - 30 mm/h  CK     Status: Abnormal   Collection Time: 10/29/18  4:18 PM  Result Value Ref Range   Total CK 184 (H) 29 - 143 U/L  TSH     Status: None   Collection Time: 10/29/18  4:18 PM  Result Value Ref Range   TSH 0.55 0.40 - 4.50 mIU/L  HIV antibody (with reflex)     Status: None   Collection Time: 10/29/18  4:18 PM  Result Value Ref Range   HIV 1&2 Ab, 4th Generation NON-REACTIVE NON-REACTI    Comment: HIV-1 antigen and HIV-1/HIV-2 antibodies were not detected. There is no laboratory evidence of HIV infection. Marland Kitchen. PLEASE NOTE: This information has been disclosed to you from records whose confidentiality may be protected by state law.  If your state requires such protection, then the state law prohibits you from making any further disclosure of the information without the specific written consent of the person to whom it pertains, or as otherwise permitted by law. A  general authorization for the release of medical or other information is NOT sufficient for this purpose. . For additional information please refer to http://education.[facility].com/faq/FAQ106 (This link is being provided for informational/ educational purposes only.) . Marland Kitchen. The performance of this assay has not been clinically validated in patients less than 59 years old. .   Iron, TIBC and Ferritin Panel     Status: Abnormal   Collection Time: 10/29/18  4:18 PM  Result Value Ref Range   Iron 43 (L) 45 - 160 mcg/dL   TIBC 960320 454250 - 098450 mcg/dL (calc)   %SAT 13 (L) 16 - 45 % (calc)   Ferritin 12 (L) 16 - 232 ng/mL  TEST AUTHORIZATION     Status: None   Collection Time: 10/29/18  4:18 PM  Result Value Ref Range   TEST NAME: IRON, TIBC AND FERRITIN PANEL    TEST CODE: [id number]    CLIENT CONTACT: VANICIA HERNANDEZ    REPORT ALWAYS MESSAGE SIGNATURE      Comment: . The laboratory testing on this patient was verbally requested or confirmed by the ordering physician or his or her authorized representative after contact with an employee of [facility]. Federal regulations require that we maintain on file written authorization for all laboratory testing.  Accordingly we are asking that the ordering physician or his or her authorized representative sign a copy of this report and promptly return it to the client service representative. . . Signature:____________________________________________________ . Please fax this signed page to [phone] or return it via your [facility] courier.       Assessment and Plan: 57 y.o. female with  Cervical cord compression resulting in bilateral upper and lower extremity weakness.  Fortunately patient was able to get in urgently with neurosurgery and has surgery planned for less than 1 week.   I will follow along and recheck in about 2 weeks.  Left knee tibial plateau fracture.  Not ideal treatment with hinged knee brace  and limited weightbearing but probably the best option at this time.  Limit weightbearing as much as possible.  Limit hydrocodone use over refill today.  Additionally will prescribe baclofen for possible muscle spasm as cause of pain.  Recheck in 2 weeks.  Iron deficiency anemia: Unclear etiology.  Recommend starting iron sulfate.  Plan  to recheck in a few weeks.  May need IV iron.  Likely will need colon cancer screening in the near future to evaluate for potential GI sources of bleeding.  Stop getting better now.  Elevated creatinine: Unclear etiology.  Likely transient due to injury and heavy ibuprofen use with injury.  Plan for recheck creatinine and starting renal work-up on recheck in 2 weeks.  Will ultimately shift medical issues back to PCP however will keep Dr. Linford Arnold in the loop.  Caution regarding narcotics.  Patient does have a history of narcotics abuse long time ago.  Will wean off of hydrocodone as soon as possible.   No orders of the defined types were placed in this encounter.  Meds ordered this encounter  Medications  . HYDROcodone-acetaminophen (NORCO/VICODIN) 5-325 MG tablet    Sig: Take 1 tablet by mouth every 6 (six) hours as needed.    Dispense:  15 tablet    Refill:  0    Replaces tramadol  . baclofen (LIORESAL) 10 MG tablet    Sig: Take 1 tablet (10 mg total) by mouth 3 (three) times daily.    Dispense:  30 each    Refill:  0   .Patient researched [facility] Controlled Substance Reporting System.   Historical information moved to improve visibility of documentation.  Past Medical History:  Diagnosis Date  . Arthritis    osteo  . Asthma   . Depression   . GERD (gastroesophageal reflux disease)    Past Surgical History:  Procedure Laterality Date  . BACK SURGERY  2001  . BLADDER SUSPENSION    . BREAST ENHANCEMENT SURGERY Left 1879  . CARPAL TUNNEL RELEASE Right 11/01/2016   Procedure: CARPAL TUNNEL RELEASE;  Surgeon: Jodi Geralds, MD;  Location:  [facility];  Service: Orthopedics;  Laterality: Right;  . HAND SURGERY Left 2013   for fracture, [place] [facility]  . NECK SURGERY    . ORIF WRIST FRACTURE Right 11/01/2016   Procedure: OPEN REDUCTION INTERNAL FIXATION (ORIF) WRIST FRACTURE;  Surgeon: Jodi Geralds, MD;  Location: [facility];  Service: Orthopedics;  Laterality: Right;  . SHOULDER SURGERY  2005   Social History   Tobacco Use  . Smoking status: Current Every Day Smoker    Packs/day: 1.00    Years: 40.00    Pack years: 40.00  . Smokeless tobacco: Never Used  Substance Use Topics  . Alcohol use: No   family history includes Alcohol abuse in her unknown relative; Bipolar disorder in her daughter; Breast cancer in her mother; Coronary artery disease in her father; Depression in her daughter; Hyperlipidemia in her father and mother; Kidney failure in her mother; Lung cancer in her father.  Medications: Current Outpatient Medications  Medication Sig Dispense Refill  . albuterol (PROVENTIL HFA;VENTOLIN HFA) 108 (90 Base) MCG/ACT inhaler Inhale 2 puffs into the lungs every 6 (six) hours as needed for wheezing or shortness of breath. 8.5 Inhaler 2  . AMBULATORY NON FORMULARY MEDICATION Walker use as needed disp 1. Leg weakness R53.1 1 each 0  . FLUoxetine (PROZAC) 40 MG capsule TAKE 2 CAPSULES (80 MG TOTAL) BY MOUTH DAILY. 180 capsule 1  . HYDROcodone-acetaminophen (NORCO/VICODIN) 5-325 MG tablet Take 1 tablet by mouth every 6 (six) hours as needed. 15 tablet 0  . hydrOXYzine (ATARAX/VISTARIL) 10 MG tablet TAKE 1-2 TABLETS (10-20 MG TOTAL) BY MOUTH EVERY 8 (EIGHT) HOURS AS NEEDED FOR ANXIETY. 45 tablet 3  . ipratropium-albuterol (DUONEB) 0.5-2.5 (3) MG/3ML SOLN TAKE  3 MLS BY NEBULIZATION EVERY 4 (FOUR) HOURS AS NEEDED. 360 mL 0  . [START ON 11/05/2018] lisdexamfetamine (VYVANSE) 70 MG capsule Take 1 capsule (70 mg total) by mouth daily. 30 capsule 0  . lisdexamfetamine (VYVANSE) 70 MG capsule Take 1 capsule  (70 mg total) by mouth daily. 30 capsule 0  . [START ON 12/04/2018] lisdexamfetamine (VYVANSE) 70 MG capsule Take 1 capsule (70 mg total) by mouth daily. 30 capsule 0  . lisinopril-hydrochlorothiazide (PRINZIDE,ZESTORETIC) 20-25 MG tablet TAKE 1 TABLET BY MOUTH DAILY. 90 tablet 1  . lovastatin (MEVACOR) 40 MG tablet TAKE 1 TABLET BY MOUTH EVERYDAY AT BEDTIME 90 tablet 3  . predniSONE (DELTASONE) 50 MG tablet Take 1 tablet (50 mg total) by mouth daily. 5 tablet 0  . ranitidine (ZANTAC) 150 MG tablet TAKE 1 TABLET (150 MG TOTAL) BY MOUTH 2 (TWO) TIMES DAILY. 180 tablet 1  . traMADol (ULTRAM) 50 MG tablet 1-2 tabs by mouth Q8 hours, maximum 6 tabs per day. 10 tablet 0  . baclofen (LIORESAL) 10 MG tablet Take 1 tablet (10 mg total) by mouth 3 (three) times daily. 30 each 0   No current facility-administered medications for this visit.    No Known Allergies   Discussed warning signs or symptoms. Please see discharge instructions. Patient expresses understanding.

## 2018-11-09 DIAGNOSIS — M5001 Cervical disc disorder with myelopathy,  high cervical region: Secondary | ICD-10-CM | POA: Diagnosis not present

## 2018-11-09 DIAGNOSIS — M5020 Other cervical disc displacement, unspecified cervical region: Secondary | ICD-10-CM | POA: Diagnosis not present

## 2018-11-17 ENCOUNTER — Ambulatory Visit (INDEPENDENT_AMBULATORY_CARE_PROVIDER_SITE_OTHER): Payer: BLUE CROSS/BLUE SHIELD

## 2018-11-17 ENCOUNTER — Ambulatory Visit: Payer: BLUE CROSS/BLUE SHIELD | Admitting: Family Medicine

## 2018-11-17 ENCOUNTER — Encounter: Payer: Self-pay | Admitting: Family Medicine

## 2018-11-17 VITALS — BP 115/51 | HR 99

## 2018-11-17 DIAGNOSIS — S82142A Displaced bicondylar fracture of left tibia, initial encounter for closed fracture: Secondary | ICD-10-CM

## 2018-11-17 DIAGNOSIS — G9520 Unspecified cord compression: Secondary | ICD-10-CM | POA: Diagnosis not present

## 2018-11-17 DIAGNOSIS — D509 Iron deficiency anemia, unspecified: Secondary | ICD-10-CM | POA: Diagnosis not present

## 2018-11-17 DIAGNOSIS — S82121D Displaced fracture of lateral condyle of right tibia, subsequent encounter for closed fracture with routine healing: Secondary | ICD-10-CM | POA: Diagnosis not present

## 2018-11-17 DIAGNOSIS — N179 Acute kidney failure, unspecified: Secondary | ICD-10-CM | POA: Diagnosis not present

## 2018-11-17 NOTE — Progress Notes (Signed)
AVELEEN Fernandez is a 57 y.o. female who presents to [name]: Primary Care Sports Medicine today for follow-up of left knee fracture.  Patient was seen several times in mid November after she fell.  The most urgent issue identified was significant cervical cord compression.  She had decompressive surgery a few weeks ago and is feeling a lot better.  She notes her strength turning in her upper and lower extremities.  As result of the fall she suffered a tibial plateau fracture of the lateral aspect of the left knee.  She has been doing nonweightbearing with hinged knee brace and compression.  She notes her pain is significantly improved.  She is happy with how things are going.  As part of the metabolic work-up she also had identified iron deficiency anemia and increase in her baseline creatinine.  She feels well.  She notes that she is discontinued Goody powder and has started taking iron supplements.   ROS as above:  Exam:  BP (!) 115/51   Pulse 99  Wt Readings from Last 5 Encounters:  09/14/18 144 lb (65.3 kg)  06/19/18 139 lb (63 kg)  04/16/18 142 lb (64.4 kg)  02/16/18 142 lb (64.4 kg)  12/18/17 138 lb (62.6 kg)    Gen: Well NAD HEENT: EOMI,  MMM  well-appearing surgical incision left anterior neck Lungs: Normal work of breathing. CTABL  Heart: RRR no MRG Abd: NABS, Soft. Nondistended, Nontender Exts: Brisk capillary refill, warm and well perfused.  MSK: Left knee normal-appearing with no deformities or swelling. Not particularly tender. Normal range of motion.  Strength not tested.  Lab and Radiology Results X-ray images left knee personally independently reviewed Nondisplaced lateral tibial plateau fracture with no significant change from x-ray about 2 weeks ago. Await formal radiology review   Assessment and Plan: 57 y.o. female with left knee fracture: Clinically improving.   X-ray look stable per my read however radiology over read is still pending.  Plan for continued nonweightbearing.  Will start straight leg raises to improve quad strength bilaterally.  Spinal cord compression status post decompressive surgery.  Doing quite well.  Managed with neurosurgery.  Iron deficiency anemia: Plan to recheck CBC and iron stores at the follow-up visit about 2 weeks.  This will be about 1 month since her original lab.  Likely will proceed with colon cancer screening to evaluate also potential GI source of iron deficiency.  Increased creatinine: Recheck metabolic panel in 2 weeks.   Orders Placed This Encounter  Procedures  . DG Knee Complete 4 Views Left    Please include patellar sunrise, lateral, and weightbearing bilateral AP and bilateral rosenberg views    Standing Status:   Future    Standing Expiration Date:   01/17/2020    Order Specific Question:   Reason for exam:    Answer:   Non weaight bearing trauma views follow up fx    Comments:   Please include patellar sunrise, lateral, and weightbearing bilateral AP and bilateral rosenberg views    Order Specific Question:   Preferred imaging location?    Answer:   [facility]   No orders of the defined types were placed in this encounter.    Historical information moved to improve visibility of documentation.  Past Medical History:  Diagnosis Date  . Arthritis    osteo  . Asthma   . Depression   . GERD (gastroesophageal reflux disease)   . Iron deficiency anemia  09/11/2012   Past Surgical History:  Procedure Laterality Date  . BACK SURGERY  2001  . BLADDER SUSPENSION    . BREAST ENHANCEMENT SURGERY Left 1879  . CARPAL TUNNEL RELEASE Right 11/01/2016   Procedure: CARPAL TUNNEL RELEASE;  Surgeon: Jodi GeraldsJohn Graves, MD;  Location: [facility];  Service: Orthopedics;  Laterality: Right;  . HAND SURGERY Left 2013   for fracture, [place] [facility]  . NECK SURGERY    . ORIF WRIST FRACTURE Right  11/01/2016   Procedure: OPEN REDUCTION INTERNAL FIXATION (ORIF) WRIST FRACTURE;  Surgeon: Jodi GeraldsJohn Graves, MD;  Location: [facility];  Service: Orthopedics;  Laterality: Right;  . SHOULDER SURGERY  2005   Social History   Tobacco Use  . Smoking status: Current Every Day Smoker    Packs/day: 1.00    Years: 40.00    Pack years: 40.00  . Smokeless tobacco: Never Used  Substance Use Topics  . Alcohol use: No   family history includes Alcohol abuse in her unknown relative; Bipolar disorder in her daughter; Breast cancer in her mother; Coronary artery disease in her father; Depression in her daughter; Hyperlipidemia in her father and mother; Kidney failure in her mother; Lung cancer in her father.  Medications: Current Outpatient Medications  Medication Sig Dispense Refill  . albuterol (PROVENTIL HFA;VENTOLIN HFA) 108 (90 Base) MCG/ACT inhaler Inhale 2 puffs into the lungs every 6 (six) hours as needed for wheezing or shortness of breath. 8.5 Inhaler 2  . AMBULATORY NON FORMULARY MEDICATION Walker use as needed disp 1. Leg weakness R53.1 1 each 0  . baclofen (LIORESAL) 10 MG tablet Take 1 tablet (10 mg total) by mouth 3 (three) times daily. 30 each 0  . FLUoxetine (PROZAC) 40 MG capsule TAKE 2 CAPSULES (80 MG TOTAL) BY MOUTH DAILY. 180 capsule 1  . HYDROcodone-acetaminophen (NORCO/VICODIN) 5-325 MG tablet Take 1 tablet by mouth every 6 (six) hours as needed. 15 tablet 0  . hydrOXYzine (ATARAX/VISTARIL) 10 MG tablet TAKE 1-2 TABLETS (10-20 MG TOTAL) BY MOUTH EVERY 8 (EIGHT) HOURS AS NEEDED FOR ANXIETY. 45 tablet 3  . ipratropium-albuterol (DUONEB) 0.5-2.5 (3) MG/3ML SOLN TAKE 3 MLS BY NEBULIZATION EVERY 4 (FOUR) HOURS AS NEEDED. 360 mL 0  . lisdexamfetamine (VYVANSE) 70 MG capsule Take 1 capsule (70 mg total) by mouth daily. 30 capsule 0  . lisdexamfetamine (VYVANSE) 70 MG capsule Take 1 capsule (70 mg total) by mouth daily. 30 capsule 0  . [START ON 12/04/2018] lisdexamfetamine  (VYVANSE) 70 MG capsule Take 1 capsule (70 mg total) by mouth daily. 30 capsule 0  . lisinopril-hydrochlorothiazide (PRINZIDE,ZESTORETIC) 20-25 MG tablet TAKE 1 TABLET BY MOUTH DAILY. 90 tablet 1  . lovastatin (MEVACOR) 40 MG tablet TAKE 1 TABLET BY MOUTH EVERYDAY AT BEDTIME 90 tablet 3  . predniSONE (DELTASONE) 50 MG tablet Take 1 tablet (50 mg total) by mouth daily. 5 tablet 0  . ranitidine (ZANTAC) 150 MG tablet TAKE 1 TABLET (150 MG TOTAL) BY MOUTH 2 (TWO) TIMES DAILY. 180 tablet 1  . traMADol (ULTRAM) 50 MG tablet 1-2 tabs by mouth Q8 hours, maximum 6 tabs per day. 10 tablet 0   No current facility-administered medications for this visit.    No Known Allergies   Discussed warning signs or symptoms. Please see discharge instructions. Patient expresses understanding.

## 2018-11-17 NOTE — Patient Instructions (Signed)
Thank you for coming in today. Continue non-weight bearing and knee brace. Start straight leg raises.  Laying in bed with the knee straight raise the foot off the bed by about 2 feet. Let the leg go back down slowly.  Repeat for about 10-30 reps 2x daily.  Ok to do on both sides.   Recheck with me in 2 weeks.  Will get labs then.

## 2018-11-25 ENCOUNTER — Other Ambulatory Visit: Payer: Self-pay | Admitting: Family Medicine

## 2018-11-25 DIAGNOSIS — K219 Gastro-esophageal reflux disease without esophagitis: Secondary | ICD-10-CM

## 2018-11-25 NOTE — Telephone Encounter (Signed)
Contacted [facility] and verified with Alla that they have Ranitidine 150 mg that was NOT recalled- they will be able to fill this RX

## 2018-12-01 ENCOUNTER — Ambulatory Visit (INDEPENDENT_AMBULATORY_CARE_PROVIDER_SITE_OTHER): Payer: BLUE CROSS/BLUE SHIELD

## 2018-12-01 ENCOUNTER — Ambulatory Visit: Payer: BLUE CROSS/BLUE SHIELD | Admitting: Family Medicine

## 2018-12-01 ENCOUNTER — Encounter: Payer: Self-pay | Admitting: Family Medicine

## 2018-12-01 VITALS — BP 126/54 | HR 101 | Wt 145.0 lb

## 2018-12-01 DIAGNOSIS — S82142A Displaced bicondylar fracture of left tibia, initial encounter for closed fracture: Secondary | ICD-10-CM

## 2018-12-01 DIAGNOSIS — W19XXXD Unspecified fall, subsequent encounter: Secondary | ICD-10-CM | POA: Diagnosis not present

## 2018-12-01 DIAGNOSIS — D509 Iron deficiency anemia, unspecified: Secondary | ICD-10-CM

## 2018-12-01 DIAGNOSIS — S82142D Displaced bicondylar fracture of left tibia, subsequent encounter for closed fracture with routine healing: Secondary | ICD-10-CM | POA: Diagnosis not present

## 2018-12-01 DIAGNOSIS — Z1211 Encounter for screening for malignant neoplasm of colon: Secondary | ICD-10-CM

## 2018-12-01 DIAGNOSIS — D649 Anemia, unspecified: Secondary | ICD-10-CM

## 2018-12-01 DIAGNOSIS — N179 Acute kidney failure, unspecified: Secondary | ICD-10-CM

## 2018-12-01 NOTE — Patient Instructions (Signed)
Thank you for coming in today. You should hear about the referral for gastroenterology soon.  Get labs now.  Recheck with me in 1 month about the knee unless something happens or we need to do xray sooner.  In 2 weeks advance walking as tolerated.

## 2018-12-01 NOTE — Progress Notes (Signed)
Glenda Fernandez is a 57 y.o. female who presents to [facility] today for follow-up on tibial plateau fracture and iron deficiency anemia.   Tibial plateau fracture: Glenda Fernandez reports that she is doing very well. She did not like using the walker and felt that she was ready to start walking, so she has been using a cane around her house. She has only occasional mild pain and takes Tylenol when needed.   Iron deficiency anemia: She has been taking iron as directed. She has not yet scheduled her colonoscopy.   Elevated creatinine: Noted on her original assessment about a month ago.  Patient denies any significant leg swelling.  ROS:  As above  Exam:  BP (!) 126/54   Pulse (!) 101   Wt 145 lb (65.8 kg)   BMI 22.71 kg/m  General: Well Developed, well nourished, and in no acute distress.  Neuro/Psych: Alert and oriented x3, extra-ocular muscles intact, able to move all 4 extremities, sensation grossly intact. Skin: Warm and dry, no rashes noted.  Respiratory: Not using accessory muscles, speaking in full sentences, trachea midline.  Cardiovascular: Pulses palpable, no extremity edema. Abdomen: Does not appear distended. MSK:  Left knee: Normal-appearing. Mild tenderness to palpation over tibial plateau.  ROM limited due to pain. Pulses and capillary refill normal.  Right knee: Normal-appearing, no tenderness to palpation, normal ROM, normal strength. Pulses and capillary refill normal.   Lab and Radiology Results No results found.  X-ray images left knee personally independently reviewed: Mildly displaced lateral tibial plateau fracture.  No significant change in orientation and positioning since x-ray 2 weeks ago.  New callus formation present. Await formal radiology review.   Assessment and Plan: 57 y.o. female with   Tibial plateau fracture: Glenda Fernandez knee is much improved from last visit. X-ray today shows improvement from  last x-ray and signs of new bone growth. She has started weight-bearing prior to the recommended six weeks of non-weightbearing. Advised her to use a walker with limited weightbearing and in two weeks can advance to walking as tolerated. Follow-up in one month.   Iron deficiency anemia: Considering GI bleeding as potential cause of IDA. Counseled pt on need for colonoscopy, rather than cologuard. Pt will hear about referral to GI soon. Follow-up on CBC and iron panel today.   Increased creatinine: Follow-up on renal function panel today.  Recheck 1 month.  Return sooner if needed.  Transfer care back to PCP after next visit.  Orders Placed This Encounter  Procedures  . DG Knee Complete 4 Views Left    Please include patellar sunrise, lateral, and weightbearing bilateral AP and bilateral rosenberg views    Standing Status:   Future    Number of Occurrences:   1    Standing Expiration Date:   01/31/2020    Order Specific Question:   Reason for exam:    Answer:   follow up fx    Comments:   Please include patellar sunrise, lateral, and weightbearing bilateral AP and bilateral rosenberg views    Order Specific Question:   Preferred imaging location?    Answer:   [facility]  . CBC  . Renal Function Panel  . Fe+TIBC+Fer  . Ambulatory referral to Gastroenterology    Referral Priority:   Routine    Referral Type:   Consultation    Referral Reason:   Specialty Services Required    Number of Visits Requested:   1   No  orders of the defined types were placed in this encounter.   Historical information moved to improve visibility of documentation.  Past Medical History:  Diagnosis Date  . Arthritis    osteo  . Asthma   . Depression   . GERD (gastroesophageal reflux disease)   . Iron deficiency anemia 09/11/2012   Past Surgical History:  Procedure Laterality Date  . BACK SURGERY  2001  . BLADDER SUSPENSION    . BREAST ENHANCEMENT SURGERY Left 1879  . CARPAL TUNNEL RELEASE  Right 11/01/2016   Procedure: CARPAL TUNNEL RELEASE;  Surgeon: Jodi GeraldsJohn Graves, MD;  Location: [facility];  Service: Orthopedics;  Laterality: Right;  . HAND SURGERY Left 2013   for fracture, [place] [facility]  . NECK SURGERY    . ORIF WRIST FRACTURE Right 11/01/2016   Procedure: OPEN REDUCTION INTERNAL FIXATION (ORIF) WRIST FRACTURE;  Surgeon: Jodi GeraldsJohn Graves, MD;  Location: [facility];  Service: Orthopedics;  Laterality: Right;  . SHOULDER SURGERY  2005   Social History   Tobacco Use  . Smoking status: Current Every Day Smoker    Packs/day: 1.00    Years: 40.00    Pack years: 40.00  . Smokeless tobacco: Never Used  Substance Use Topics  . Alcohol use: No   family history includes Alcohol abuse in her unknown relative; Bipolar disorder in her daughter; Breast cancer in her mother; Coronary artery disease in her father; Depression in her daughter; Hyperlipidemia in her father and mother; Kidney failure in her mother; Lung cancer in her father.  Medications: Current Outpatient Medications  Medication Sig Dispense Refill  . albuterol (PROVENTIL HFA;VENTOLIN HFA) 108 (90 Base) MCG/ACT inhaler Inhale 2 puffs into the lungs every 6 (six) hours as needed for wheezing or shortness of breath. 8.5 Inhaler 2  . AMBULATORY NON FORMULARY MEDICATION Walker use as needed disp 1. Leg weakness R53.1 1 each 0  . baclofen (LIORESAL) 10 MG tablet Take 1 tablet (10 mg total) by mouth 3 (three) times daily. 30 each 0  . FLUoxetine (PROZAC) 40 MG capsule TAKE 2 CAPSULES (80 MG TOTAL) BY MOUTH DAILY. 180 capsule 1  . HYDROcodone-acetaminophen (NORCO/VICODIN) 5-325 MG tablet Take 1 tablet by mouth every 6 (six) hours as needed. 15 tablet 0  . hydrOXYzine (ATARAX/VISTARIL) 10 MG tablet TAKE 1-2 TABLETS (10-20 MG TOTAL) BY MOUTH EVERY 8 (EIGHT) HOURS AS NEEDED FOR ANXIETY. 45 tablet 3  . ipratropium-albuterol (DUONEB) 0.5-2.5 (3) MG/3ML SOLN TAKE 3 MLS BY NEBULIZATION EVERY 4 (FOUR) HOURS AS  NEEDED. 360 mL 0  . lisdexamfetamine (VYVANSE) 70 MG capsule Take 1 capsule (70 mg total) by mouth daily. 30 capsule 0  . lisdexamfetamine (VYVANSE) 70 MG capsule Take 1 capsule (70 mg total) by mouth daily. 30 capsule 0  . [START ON 12/04/2018] lisdexamfetamine (VYVANSE) 70 MG capsule Take 1 capsule (70 mg total) by mouth daily. 30 capsule 0  . lisinopril-hydrochlorothiazide (PRINZIDE,ZESTORETIC) 20-25 MG tablet TAKE 1 TABLET BY MOUTH DAILY. 90 tablet 1  . lovastatin (MEVACOR) 40 MG tablet TAKE 1 TABLET BY MOUTH EVERYDAY AT BEDTIME 90 tablet 3  . predniSONE (DELTASONE) 50 MG tablet Take 1 tablet (50 mg total) by mouth daily. 5 tablet 0  . ranitidine (ZANTAC) 150 MG tablet Take 1 tablet (150 mg total) by mouth 2 (two) times daily. 90 tablet 0  . traMADol (ULTRAM) 50 MG tablet 1-2 tabs by mouth Q8 hours, maximum 6 tabs per day. 10 tablet 0   No current facility-administered medications for  this visit.    No Known Allergies    Discussed warning signs or symptoms. Please see discharge instructions. Patient expresses understanding.  I personally was present and performed or re-performed the history, physical exam and medical decision-making activities of this service and have verified that the service and findings are accurately documented in the student's note. ___________________________________________ Clementeen Graham M.D., ABFM., CAQSM. Primary Care and Sports Medicine Adjunct Instructor of Family Medicine  [employer]

## 2018-12-02 ENCOUNTER — Other Ambulatory Visit: Payer: Self-pay | Admitting: Family Medicine

## 2018-12-02 LAB — RENAL FUNCTION PANEL
Albumin: 4.1 g/dL (ref 3.6–5.1)
BUN: 22 mg/dL (ref 7–25)
CHLORIDE: 103 mmol/L (ref 98–110)
CO2: 25 mmol/L (ref 20–32)
Calcium: 9.6 mg/dL (ref 8.6–10.4)
Creat: 1.03 mg/dL (ref 0.50–1.05)
Glucose, Bld: 77 mg/dL (ref 65–99)
Phosphorus: 4.1 mg/dL (ref 2.5–4.5)
Potassium: 4.3 mmol/L (ref 3.5–5.3)
Sodium: 138 mmol/L (ref 135–146)

## 2018-12-02 LAB — CBC
HCT: 32.8 % — ABNORMAL LOW (ref 35.0–45.0)
Hemoglobin: 10.8 g/dL — ABNORMAL LOW (ref 11.7–15.5)
MCH: 28.2 pg (ref 27.0–33.0)
MCHC: 32.9 g/dL (ref 32.0–36.0)
MCV: 85.6 fL (ref 80.0–100.0)
MPV: 10.1 fL (ref 7.5–12.5)
Platelets: 444 10*3/uL — ABNORMAL HIGH (ref 140–400)
RBC: 3.83 10*6/uL (ref 3.80–5.10)
RDW: 16.1 % — ABNORMAL HIGH (ref 11.0–15.0)
WBC: 8.1 10*3/uL (ref 3.8–10.8)

## 2018-12-02 LAB — IRON,TIBC AND FERRITIN PANEL
%SAT: 27 % (calc) (ref 16–45)
Ferritin: 49 ng/mL (ref 16–232)
Iron: 82 ug/dL (ref 45–160)
TIBC: 307 mcg/dL (calc) (ref 250–450)

## 2018-12-03 DIAGNOSIS — M5000 Cervical disc disorder with myelopathy, unspecified cervical region: Secondary | ICD-10-CM | POA: Diagnosis not present

## 2018-12-28 ENCOUNTER — Ambulatory Visit (INDEPENDENT_AMBULATORY_CARE_PROVIDER_SITE_OTHER): Payer: BLUE CROSS/BLUE SHIELD | Admitting: Family Medicine

## 2018-12-28 VITALS — BP 121/56 | HR 94 | Ht 67.0 in | Wt 145.0 lb

## 2018-12-28 DIAGNOSIS — D509 Iron deficiency anemia, unspecified: Secondary | ICD-10-CM

## 2018-12-28 DIAGNOSIS — I10 Essential (primary) hypertension: Secondary | ICD-10-CM | POA: Diagnosis not present

## 2018-12-28 DIAGNOSIS — F902 Attention-deficit hyperactivity disorder, combined type: Secondary | ICD-10-CM | POA: Diagnosis not present

## 2018-12-28 MED ORDER — LISDEXAMFETAMINE DIMESYLATE 70 MG PO CAPS: 70.0000 mg | ORAL_CAPSULE | Freq: Every day | ORAL | 0 refills | Status: DC

## 2018-12-28 NOTE — Progress Notes (Signed)
Subjective:    CC: ADHD.  HPI:  ADHD - Reports symptoms are well controlled on current regime. Denies any problems with insomnia, chest pain, palpitations, or SOB.    She was also found to be iron deficiency in November.  She was started on oral iron supplementation.  Repeat ferritin level showed a bump from an initial ferritin of 12 up to 49 with supplementation.  She was also referred to GI because she is over 50 for colon cancer screening.  She doesn't want to go to [facility] for GI. Prefers [place].   Hypertension- Pt denies chest pain, SOB, dizziness, or heart palpitations.  Taking meds as directed w/o problems.  Denies medication side effects.      Past medical history, Surgical history, Family history not pertinant except as noted below, Social history, Allergies, and medications have been entered into the medical record, reviewed, and corrections made.   Review of Systems: No fevers, chills, night sweats, weight loss, chest pain, or shortness of breath.   Objective:    General: Well Developed, well nourished, and in no acute distress.  Neuro: Alert and oriented x3, extra-ocular muscles intact, sensation grossly intact.  HEENT: Normocephalic, atraumatic  Skin: Warm and dry, no rashes. Cardiac: Regular rate and rhythm, no murmurs rubs or gallops, no lower extremity edema.  Respiratory: Clear to auscultation bilaterally. Not using accessory muscles, speaking in full sentences.   Impression and Recommendations:    ADHD - Well controlled. Continue current regimen. Follow up in  5months.    Deficiency-referral has been placed for GI evaluation in addition she is getting some improvement in her numbers with just oral supplementation of iron.  Needs colon ca screening.      HTN - Well controlled. Continue current regimen. Follow up in 4 months.

## 2018-12-30 ENCOUNTER — Ambulatory Visit: Payer: BLUE CROSS/BLUE SHIELD

## 2018-12-30 ENCOUNTER — Ambulatory Visit: Payer: BLUE CROSS/BLUE SHIELD | Admitting: Family Medicine

## 2018-12-31 ENCOUNTER — Ambulatory Visit (INDEPENDENT_AMBULATORY_CARE_PROVIDER_SITE_OTHER): Payer: BLUE CROSS/BLUE SHIELD | Admitting: Family Medicine

## 2018-12-31 ENCOUNTER — Encounter: Payer: Self-pay | Admitting: Family Medicine

## 2018-12-31 ENCOUNTER — Ambulatory Visit (INDEPENDENT_AMBULATORY_CARE_PROVIDER_SITE_OTHER): Payer: BLUE CROSS/BLUE SHIELD

## 2018-12-31 VITALS — BP 129/52 | HR 91 | Wt 145.0 lb

## 2018-12-31 DIAGNOSIS — Z780 Asymptomatic menopausal state: Secondary | ICD-10-CM | POA: Diagnosis not present

## 2018-12-31 DIAGNOSIS — M25562 Pain in left knee: Secondary | ICD-10-CM

## 2018-12-31 DIAGNOSIS — S8992XA Unspecified injury of left lower leg, initial encounter: Secondary | ICD-10-CM

## 2018-12-31 DIAGNOSIS — M25572 Pain in left ankle and joints of left foot: Secondary | ICD-10-CM

## 2018-12-31 DIAGNOSIS — S82142A Displaced bicondylar fracture of left tibia, initial encounter for closed fracture: Secondary | ICD-10-CM

## 2018-12-31 DIAGNOSIS — M7989 Other specified soft tissue disorders: Secondary | ICD-10-CM | POA: Diagnosis not present

## 2018-12-31 DIAGNOSIS — S82142D Displaced bicondylar fracture of left tibia, subsequent encounter for closed fracture with routine healing: Secondary | ICD-10-CM | POA: Diagnosis not present

## 2018-12-31 MED ORDER — DICLOFENAC SODIUM 1 % TD GEL: 4.0000 g | Freq: Four times a day (QID) | TRANSDERMAL | 11 refills | Status: DC

## 2018-12-31 NOTE — Progress Notes (Signed)
Glenda Fernandez is a 58 y.o. female who presents to [facility] today for follow-up left knee fracture, and discuss new left ankle pain.  [name] was originally seen on November 14 after she had fallen and injured her left knee.  During that visit she was found to have a lateral tibial plateau fracture that was slightly depressed.  However more importantly during that visit she was found to have significant muscle weakness and after work-up was found to have significant cervical cord compression.  This was successfully decompressed shortly afterwards.  Her left knee fracture which was somewhat put on the back burner healed with slight depression.  She was originally treated with nonweightbearing and her weightbearing was progressed.  On presentation today she notes that she does not have a lot of knee pain at all.  She notes that she still has some range of motion limitation in her knee and notes a little bit of limping.  She continues to use a knee brace.  She has not yet started any home exercises or physical therapy for her knee.  Additionally she notes new onset left ankle pain and swelling.  This is been present now for about a week.  She cannot recall any specific ankle injury.  She notes posterior ankle swelling.  She is not tried much treatment yet either.  She is tried some over-the-counter medications for pain which have helped a little.  No fevers chills nausea vomiting or diarrhea.    ROS:  As above  Exam:  BP (!) 129/52   Pulse 91   Wt 145 lb (65.8 kg)   BMI 22.71 kg/m  Wt Readings from Last 5 Encounters:  12/31/18 145 lb (65.8 kg)  12/28/18 145 lb (65.8 kg)  12/01/18 145 lb (65.8 kg)  09/14/18 144 lb (65.3 kg)  06/19/18 139 lb (63 kg)   General: Well Developed, well nourished, and in no acute distress.  Neuro/Psych: Alert and oriented x3, extra-ocular muscles intact, able to move all 4 extremities, sensation grossly intact. Skin:  Warm and dry, no rashes noted.  Respiratory: Not using accessory muscles, speaking in full sentences, trachea midline.  Cardiovascular: Pulses palpable, no extremity edema. Abdomen: Does not appear distended. MSK:  Left knee: Relatively normal-appearing with no significant deformity swelling effusion or erythema. Range of motion 5-110 degrees. Stable ligamentous exam. Intact extension and flexion strength.  Left ankle: Slight swelling posteriorly both medially and laterally.  No skin changes erythema or induration. Normal ankle motion. Mildly tender palpation posterior medial ankle. No tendon nodules or tenderness or tendon defects present along the course of the Achilles tendon. Intact dorsiflexion strength without pain. Intact ankle eversion without pain. Intact inversion strength however this does reproduce some pain.  Pulses capillary fill and sensation are intact distally.   Lab and Radiology Results Limited musculoskeletal ultrasound left ankle: Intact Achilles tendon with no tendon defects. Small retrocalcaneal bursa present. Posterior tibialis tendon is intact along its course with mild hypoechoic fluid present at the tarsal tunnel. Normal bony structures.  X-ray images personally independently reviewed.  Left ankle:  No acute fractures.  Sequelae of old fracture at distal fibula present with good healing.  Small rounded avulsion fragment present at distal end of lateral malleolus present.  No acute fractures present.  Slight osteopenia appearance.  Mild degenerative changes present.   X-ray left knee:  No change in location of lateral tibial plateau fracture depression since last image.  Significant callus formation present with  good healing.  Await formal radiology review x-ray ankle and knee    Assessment and Plan: [age] y.o. female with  Left knee fracture: Clinically healing however alignment is been less than ideal.  At the time of her initial diagnosis she had  significant cord cervical compression that was the top priority.  Glenda Fernandez's knee has improved significantly but she does not have fantastic range of motion which I think is limiting her ability to walk normally.  We discussed options.  At this point her best surgical option would probably be a total knee replacement which she would like to avoid.  I think it is reasonable to proceed with trial of conservative management.  She declined physical therapy.  We will proceed with home exercise program.  Work on knee extension of motion and quad strengthening.  Plan to recheck in 1 month.  Left posterior ankle pain and swelling.  Patient has a history of ankle fracture which appears to be old and well-healed on x-ray per my read today.  Ultrasound shows retrocalcaneal bursitis which likely is the source of her pain and swelling.  I believe her ankle is irritated likely because of her gait.  She is unable to fully extend her knee which is causing her to limp which I think is causing her ankle pain and swelling.  Plan to work on knee range of motion as above.  Additionally will treat with Achilles tendon eccentric exercises, ice, compression, and diclofenac gel topically.  Recheck 1 month.   Additionally with history of fracture and osteopenia appearance on x-ray patient would benefit bone density testing.  She is scheduled for mammogram on 22 January and this will be the ideal time to obtain a DEXA scan.    PDMP not reviewed this encounter. Orders Placed This Encounter  Procedures  . DG Knee Complete 4 Views Left    Standing Status:   Future    Number of Occurrences:   1    Standing Expiration Date:   02/29/2020    Order Specific Question:   Reason for Exam (SYMPTOM  OR DIAGNOSIS REQUIRED)    Answer:   knee pain    Order Specific Question:   Is patient pregnant?    Answer:   No    Order Specific Question:   [facility] imaging location?    [facility]    Order Specific Question:    Radiology Contrast Protocol - do NOT remove file path    Answer:   \\[email or web address]s.pdf  . DG Ankle Complete Left    Standing Status:   Future    Number of Occurrences:   1    Standing Expiration Date:   02/29/2020    Order Specific Question:   Reason for Exam (SYMPTOM  OR DIAGNOSIS REQUIRED)    Answer:   eval left ankle pain and swelling    Order Specific Question:   Is patient pregnant?    Answer:   No    Order Specific Question:   Pref[facility] imaging location?    [facility]    Order Specific Question:   Radiology Contrast Protocol - do NOT remove file path    Answer:   \\[email or web address]s.pdf  . DG Bone Density    Standing Status:   Future    Standing Expiration Date:   03/02/2020    Order Specific Question:   Reason for exam:    Answer:   eval bone density    Order Specific Question:  Preferred imaging location?    [facility]   Meds ordered this encounter  Medications  . diclofenac sodium (VOLTAREN) 1 % GEL    Sig: Apply 4 g topically 4 (four) times daily. To affected joint.    Dispense:  100 g    Refill:  11    Historical information moved to improve visibility of documentation.  Past Medical History:  Diagnosis Date  . Arthritis    osteo  . Asthma   . Depression   . GERD (gastroesophageal reflux disease)   . Iron deficiency anemia 09/11/2012   Past Surgical History:  Procedure Laterality Date  . BACK SURGERY  2001  . BLADDER SUSPENSION    . BREAST ENHANCEMENT SURGERY Left 1879  . CARPAL TUNNEL RELEASE Right 11/01/2016   Procedure: CARPAL TUNNEL RELEASE;  Surgeon: Jodi GeraldsJohn Graves, MD;  Location: [facility];  Service: Orthopedics;  Laterality: Right;  . HAND SURGERY Left 2013   for fracture, [place] [facility]  . NECK SURGERY    . ORIF WRIST FRACTURE Right 11/01/2016   Procedure: OPEN REDUCTION INTERNAL FIXATION (ORIF) WRIST FRACTURE;  Surgeon: Jodi GeraldsJohn  Graves, MD;  Location: [facility];  Service: Orthopedics;  Laterality: Right;  . SHOULDER SURGERY  2005   Social History   Tobacco Use  . Smoking status: Current Every Day Smoker    Packs/day: 1.00    Years: 40.00    Pack years: 40.00  . Smokeless tobacco: Never Used  Substance Use Topics  . Alcohol use: No   family history includes Alcohol abuse in her unknown relative; Bipolar disorder in her daughter; Breast cancer in her mother; Coronary artery disease in her father; Depression in her daughter; Hyperlipidemia in her father and mother; Kidney failure in her mother; Lung cancer in her father.  Medications: Current Outpatient Medications  Medication Sig Dispense Refill  . albuterol (PROVENTIL HFA;VENTOLIN HFA) 108 (90 Base) MCG/ACT inhaler Inhale 2 puffs into the lungs every 6 (six) hours as needed for wheezing or shortness of breath. 8.5 Inhaler 2  . FLUoxetine (PROZAC) 40 MG capsule TAKE 2 CAPSULES (80 MG TOTAL) BY MOUTH DAILY. 180 capsule 1  . hydrOXYzine (ATARAX/VISTARIL) 10 MG tablet TAKE 1-2 TABLETS (10-20 MG TOTAL) BY MOUTH EVERY 8 (EIGHT) HOURS AS NEEDED FOR ANXIETY. 45 tablet 3  . ipratropium-albuterol (DUONEB) 0.5-2.5 (3) MG/3ML SOLN TAKE 3 MLS BY NEBULIZATION EVERY 4 (FOUR) HOURS AS NEEDED. 360 mL 0  . [START ON 02/24/2019] lisdexamfetamine (VYVANSE) 70 MG capsule Take 1 capsule (70 mg total) by mouth daily. 30 capsule 0  . [START ON 01/27/2019] lisdexamfetamine (VYVANSE) 70 MG capsule Take 1 capsule (70 mg total) by mouth daily. 30 capsule 0  . lisdexamfetamine (VYVANSE) 70 MG capsule Take 1 capsule (70 mg total) by mouth daily. 30 capsule 0  . [START ON 03/26/2019] lisdexamfetamine (VYVANSE) 70 MG capsule Take 1 capsule (70 mg total) by mouth daily. 30 capsule 0  . lisinopril-hydrochlorothiazide (PRINZIDE,ZESTORETIC) 20-25 MG tablet TAKE 1 TABLET BY MOUTH DAILY. 90 tablet 1  . lovastatin (MEVACOR) 40 MG tablet TAKE 1 TABLET BY MOUTH EVERYDAY AT BEDTIME 90 tablet  3  . ranitidine (ZANTAC) 150 MG tablet Take 1 tablet (150 mg total) by mouth 2 (two) times daily. 90 tablet 0  . diclofenac sodium (VOLTAREN) 1 % GEL Apply 4 g topically 4 (four) times daily. To affected joint. 100 g 11   No current facility-administered medications for this visit.    No Known Allergies  Discussed warning signs or symptoms. Please see discharge instructions. Patient expresses understanding.

## 2018-12-31 NOTE — Patient Instructions (Addendum)
Thank you for coming in today. The ankle pain is due to  Retrocalcaneal bursitis.  Do the exercises  Heel up to down slowly.  30 x 2x daily.  Apply ice to the back outside. About 20 mins daily.   Apply diclofenac gel to the ankle and to the knee.   For the knee we are working on range of motion and strength.  Lay on you stomach and allow the knee to straighten.  Then laying on your back keeping the knee straight raise the leg about 1 foot in the air.  Do this 30 times 2x daily.   If we cant get the knee functional evetually total knee replacement will ne due.

## 2019-01-06 ENCOUNTER — Ambulatory Visit: Payer: BLUE CROSS/BLUE SHIELD

## 2019-01-06 ENCOUNTER — Other Ambulatory Visit: Payer: BLUE CROSS/BLUE SHIELD

## 2019-01-06 ENCOUNTER — Encounter: Payer: Self-pay | Admitting: Family Medicine

## 2019-01-11 ENCOUNTER — Ambulatory Visit: Payer: BLUE CROSS/BLUE SHIELD | Admitting: Family Medicine

## 2019-01-11 ENCOUNTER — Other Ambulatory Visit: Payer: Self-pay | Admitting: Family Medicine

## 2019-01-11 DIAGNOSIS — K219 Gastro-esophageal reflux disease without esophagitis: Secondary | ICD-10-CM

## 2019-01-13 ENCOUNTER — Ambulatory Visit: Payer: BLUE CROSS/BLUE SHIELD

## 2019-01-14 ENCOUNTER — Ambulatory Visit: Payer: BLUE CROSS/BLUE SHIELD | Admitting: Family Medicine

## 2019-01-22 ENCOUNTER — Other Ambulatory Visit: Payer: Self-pay | Admitting: Family Medicine

## 2019-01-22 DIAGNOSIS — I10 Essential (primary) hypertension: Secondary | ICD-10-CM

## 2019-01-25 ENCOUNTER — Ambulatory Visit: Payer: BLUE CROSS/BLUE SHIELD | Admitting: Family Medicine

## 2019-01-25 ENCOUNTER — Encounter: Payer: Self-pay | Admitting: Family Medicine

## 2019-01-25 VITALS — BP 103/73 | HR 108 | Temp 98.9°F | Wt 142.0 lb

## 2019-01-25 DIAGNOSIS — R05 Cough: Secondary | ICD-10-CM

## 2019-01-25 DIAGNOSIS — S82142A Displaced bicondylar fracture of left tibia, initial encounter for closed fracture: Secondary | ICD-10-CM | POA: Diagnosis not present

## 2019-01-25 DIAGNOSIS — R059 Cough, unspecified: Secondary | ICD-10-CM

## 2019-01-25 MED ORDER — PREDNISONE 10 MG PO TABS: 30.0000 mg | ORAL_TABLET | Freq: Every day | ORAL | 0 refills | Status: DC

## 2019-01-25 MED ORDER — ALBUTEROL SULFATE HFA 108 (90 BASE) MCG/ACT IN AERS: 2.0000 | INHALATION_SPRAY | Freq: Four times a day (QID) | RESPIRATORY_TRACT | 1 refills | Status: DC | PRN

## 2019-01-25 MED ORDER — AZITHROMYCIN 250 MG PO TABS: 250.0000 mg | ORAL_TABLET | Freq: Every day | ORAL | 0 refills | Status: DC

## 2019-01-25 MED ORDER — BENZONATATE 200 MG PO CAPS: 200.0000 mg | ORAL_CAPSULE | Freq: Three times a day (TID) | ORAL | 1 refills | Status: DC | PRN

## 2019-01-25 MED ORDER — BUDESONIDE-FORMOTEROL FUMARATE 160-4.5 MCG/ACT IN AERO: 2.0000 | INHALATION_SPRAY | Freq: Two times a day (BID) | RESPIRATORY_TRACT | 0 refills | Status: DC

## 2019-01-25 NOTE — Progress Notes (Signed)
Glenda DillWendy P Fernandez is a 58 y.o. female who [facility] today for cough congestion wheezing shortness of breath.  Symptoms present for about 1 week.  She has been using her home nebulizer which helps temporarily.  She will have to use it several times per day.  She is run out of her albuterol inhaler.  She is tried some over-the-counter medications which help a little.  She thinks symptoms are somewhat consistent with previous episodes of bronchitis.  She has a history of smoking but has never been diagnosed with COPD.  Additionally Glenda FailWendy is scheduled to follow-up with me in 3 days for follow-up fracture left knee.  This was identified during initial evaluation in November.  As noted previously the lateral tibial plateau fracture was a second issue is she had profound upper and lower extremity weakness due to spinal cord compression.  At last visit about a month ago she was still having some knee pain.  She notes that today she does not have much knee pain at all and is feeling a lot better.  She has been working on home exercises and is walking normally now.  She is happy with how things are going with her left knee.   ROS as above:  Exam:  BP 103/73   Pulse (!) 108   Temp 98.9 F (37.2 C) (Oral)   Wt 142 lb (64.4 kg)   SpO2 97%   BMI 22.24 kg/m  Wt Readings from Last 5 Encounters:  01/25/19 142 lb (64.4 kg)  12/31/18 145 lb (65.8 kg)  12/28/18 145 lb (65.8 kg)  12/01/18 145 lb (65.8 kg)  09/14/18 144 lb (65.3 kg)    Gen: Well NAD HEENT: EOMI,  MMM normal posterior pharynx.  Inflamed nasal turbinates bilaterally.  Normal tympanic membranes.  Mild cervical lymphadenopathy. Lungs: Normal work of breathing.  Wheezing and prolonged expiratory phase present bilaterally. Heart: Mild tachycardia regular rhythm no MRG Abd: NABS, Soft. Nondistended, Nontender Exts:  Brisk capillary refill, warm and well perfused.  Left knee nontender normal motion normal strength.  Lab and Radiology Results No results found for this or any previous visit (from the past 72 hour(s)). No results found.    Assessment and Plan: 58 y.o. female with  Cough congestion and wheezing.  Uncertain for bronchitis versus COPD exacerbation.  Plan for treatment with prednisone albuterol and Tessalon Perles.  Will use azithromycin as well.  Will refill albuterol inhaler.  Additionally will provide a sample of Symbicort.  If beneficial will prescribe it further.  Recommend follow-up with PCP in the near future for this issue.  She likely will benefit from spirometry when feeling well to evaluate for COPD.  Knee fracture: Significant symptom improvement.  Discussed options.  Patient would like to avoid repeat x-ray as she is doing well.  Continue home exercise program and follow-up with me as needed.  PDMP not reviewed this encounter. No orders of the defined types were placed in this encounter.  Meds ordered this encounter  Medications  . predniSONE (DELTASONE) 10 MG tablet    Sig: Take 3 tablets (30 mg total) by mouth daily with breakfast.    Dispense:  15 tablet    Refill:  0  . azithromycin (ZITHROMAX) 250 MG tablet    Sig: Take 1 tablet (250 mg total) by mouth daily. Take first 2 tablets together, then 1 every day until finished.    Dispense:  6  tablet    Refill:  0  . albuterol (PROVENTIL HFA;VENTOLIN HFA) 108 (90 Base) MCG/ACT inhaler    Sig: Inhale 2 puffs into the lungs every 6 (six) hours as needed for wheezing or shortness of breath.    Dispense:  8.5 Inhaler    Refill:  1  . benzonatate (TESSALON) 200 MG capsule    Sig: Take 1 capsule (200 mg total) by mouth 3 (three) times daily as needed for cough.    Dispense:  45 capsule    Refill:  1  . budesonide-formoterol (SYMBICORT) 160-4.5 MCG/ACT inhaler    Sig: Inhale 2 puffs into the lungs 2 (two) times daily.     Dispense:  1 Inhaler    Refill:  0     Historical information moved to improve visibility of documentation.  Past Medical History:  Diagnosis Date  . Arthritis    osteo  . Asthma   . Depression   . GERD (gastroesophageal reflux disease)   . Iron deficiency anemia 09/11/2012   Past Surgical History:  Procedure Laterality Date  . BACK SURGERY  2001  . BLADDER SUSPENSION    . BREAST ENHANCEMENT SURGERY Left 1879  . CARPAL TUNNEL RELEASE Right 11/01/2016   Procedure: CARPAL TUNNEL RELEASE;  Surgeon: Jodi Geralds, MD;  Location: [facility];  Service: Orthopedics;  Laterality: Right;  . HAND SURGERY Left 2013   for fracture, [place] [facility]  . NECK SURGERY    . ORIF WRIST FRACTURE Right 11/01/2016   Procedure: OPEN REDUCTION INTERNAL FIXATION (ORIF) WRIST FRACTURE;  Surgeon: Jodi Geralds, MD;  Location: [facility];  Service: Orthopedics;  Laterality: Right;  . SHOULDER SURGERY  2005   Social History   Tobacco Use  . Smoking status: Current Every Day Smoker    Packs/day: 1.00    Years: 40.00    Pack years: 40.00  . Smokeless tobacco: Never Used  Substance Use Topics  . Alcohol use: No   family history includes Alcohol abuse in her unknown relative; Bipolar disorder in her daughter; Breast cancer in her mother; Coronary artery disease in her father; Depression in her daughter; Hyperlipidemia in her father and mother; Kidney failure in her mother; Lung cancer in her father.  Medications: Current Outpatient Medications  Medication Sig Dispense Refill  . albuterol (PROVENTIL HFA;VENTOLIN HFA) 108 (90 Base) MCG/ACT inhaler Inhale 2 puffs into the lungs every 6 (six) hours as needed for wheezing or shortness of breath. 8.5 Inhaler 1  . diclofenac sodium (VOLTAREN) 1 % GEL Apply 4 g topically 4 (four) times daily. To affected joint. 100 g 11  . FLUoxetine (PROZAC) 40 MG capsule TAKE 2 CAPSULES (80 MG TOTAL) BY MOUTH DAILY. 180 capsule 1  . hydrOXYzine  (ATARAX/VISTARIL) 10 MG tablet TAKE 1-2 TABLETS (10-20 MG TOTAL) BY MOUTH EVERY 8 (EIGHT) HOURS AS NEEDED FOR ANXIETY. 45 tablet 3  . ipratropium-albuterol (DUONEB) 0.5-2.5 (3) MG/3ML SOLN TAKE 3 MLS BY NEBULIZATION EVERY 4 (FOUR) HOURS AS NEEDED. 360 mL 0  . [START ON 02/24/2019] lisdexamfetamine (VYVANSE) 70 MG capsule Take 1 capsule (70 mg total) by mouth daily. 30 capsule 0  . [START ON 01/27/2019] lisdexamfetamine (VYVANSE) 70 MG capsule Take 1 capsule (70 mg total) by mouth daily. 30 capsule 0  . lisdexamfetamine (VYVANSE) 70 MG capsule Take 1 capsule (70 mg total) by mouth daily. 30 capsule 0  . [START ON 03/26/2019] lisdexamfetamine (VYVANSE) 70 MG capsule Take 1 capsule (70 mg total) by mouth daily. 30  capsule 0  . lisinopril-hydrochlorothiazide (PRINZIDE,ZESTORETIC) 20-25 MG tablet TAKE 1 TABLET BY MOUTH DAILY. 90 tablet 1  . lovastatin (MEVACOR) 40 MG tablet TAKE 1 TABLET BY MOUTH EVERYDAY AT BEDTIME 90 tablet 3  . ranitidine (ZANTAC) 150 MG tablet TAKE 1 TABLET BY MOUTH TWICE A DAY 90 tablet 3  . azithromycin (ZITHROMAX) 250 MG tablet Take 1 tablet (250 mg total) by mouth daily. Take first 2 tablets together, then 1 every day until finished. 6 tablet 0  . benzonatate (TESSALON) 200 MG capsule Take 1 capsule (200 mg total) by mouth 3 (three) times daily as needed for cough. 45 capsule 1  . budesonide-formoterol (SYMBICORT) 160-4.5 MCG/ACT inhaler Inhale 2 puffs into the lungs 2 (two) times daily. 1 Inhaler 0  . predniSONE (DELTASONE) 10 MG tablet Take 3 tablets (30 mg total) by mouth daily with breakfast. 15 tablet 0   No current facility-administered medications for this visit.    No Known Allergies   Discussed warning signs or symptoms. Please see discharge instructions. Patient expresses understanding.

## 2019-01-25 NOTE — Patient Instructions (Addendum)
Thank you for coming in today. Call or go to the emergency room if you get worse, have trouble breathing, have chest pains, or palpitations.  Take prednisone and azithromycin for 5 days.  Use the symbicort inhaler twice daily for at lease 2 weeks.  Use albuterol and nebulizer as needed.    For the knee continue home exercises and advance activity as tolerated.  Please cancel the follow up appointment Feb 13th.   Recheck with me as needed.    Follow up with Dr Linford Arnold.

## 2019-01-28 ENCOUNTER — Ambulatory Visit: Payer: BLUE CROSS/BLUE SHIELD | Admitting: Family Medicine

## 2019-03-07 ENCOUNTER — Other Ambulatory Visit: Payer: Self-pay | Admitting: Family Medicine

## 2019-04-06 IMAGING — DX DG ANKLE COMPLETE 3+V*L*
3 series · 3 of 3 positions shown · non-contrast
Comparison: None.

CLINICAL DATA: Ankle swelling. Tibial plateau fracture 2 months ago
with instability. No recent injury.

EXAM:
LEFT ANKLE COMPLETE - 3+ VIEW

[ankle ap]
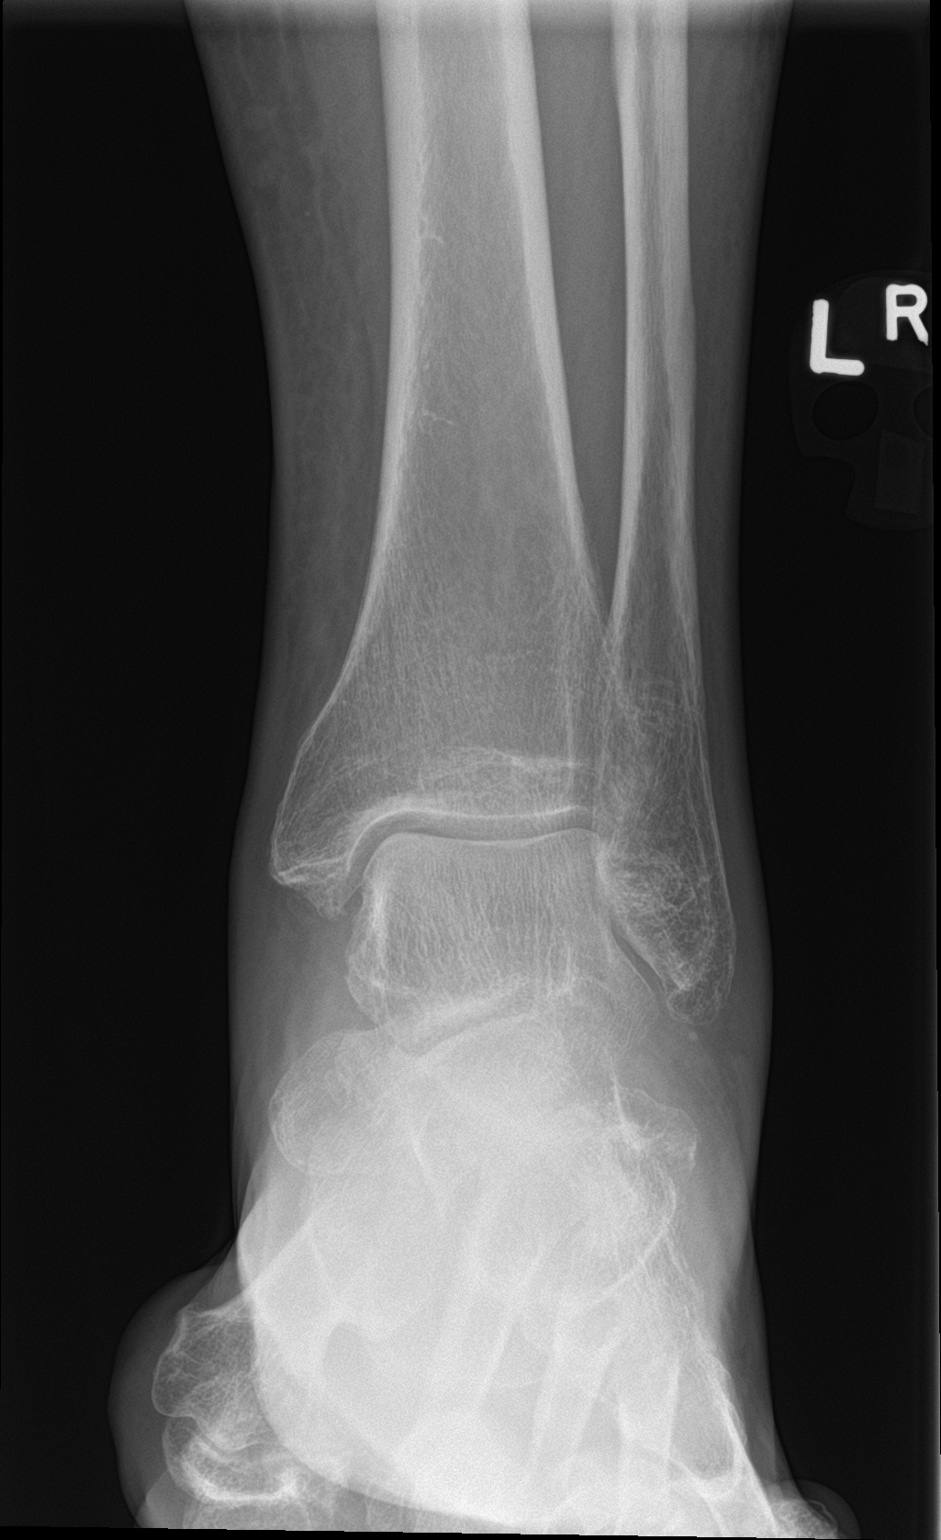

[ankle obl]
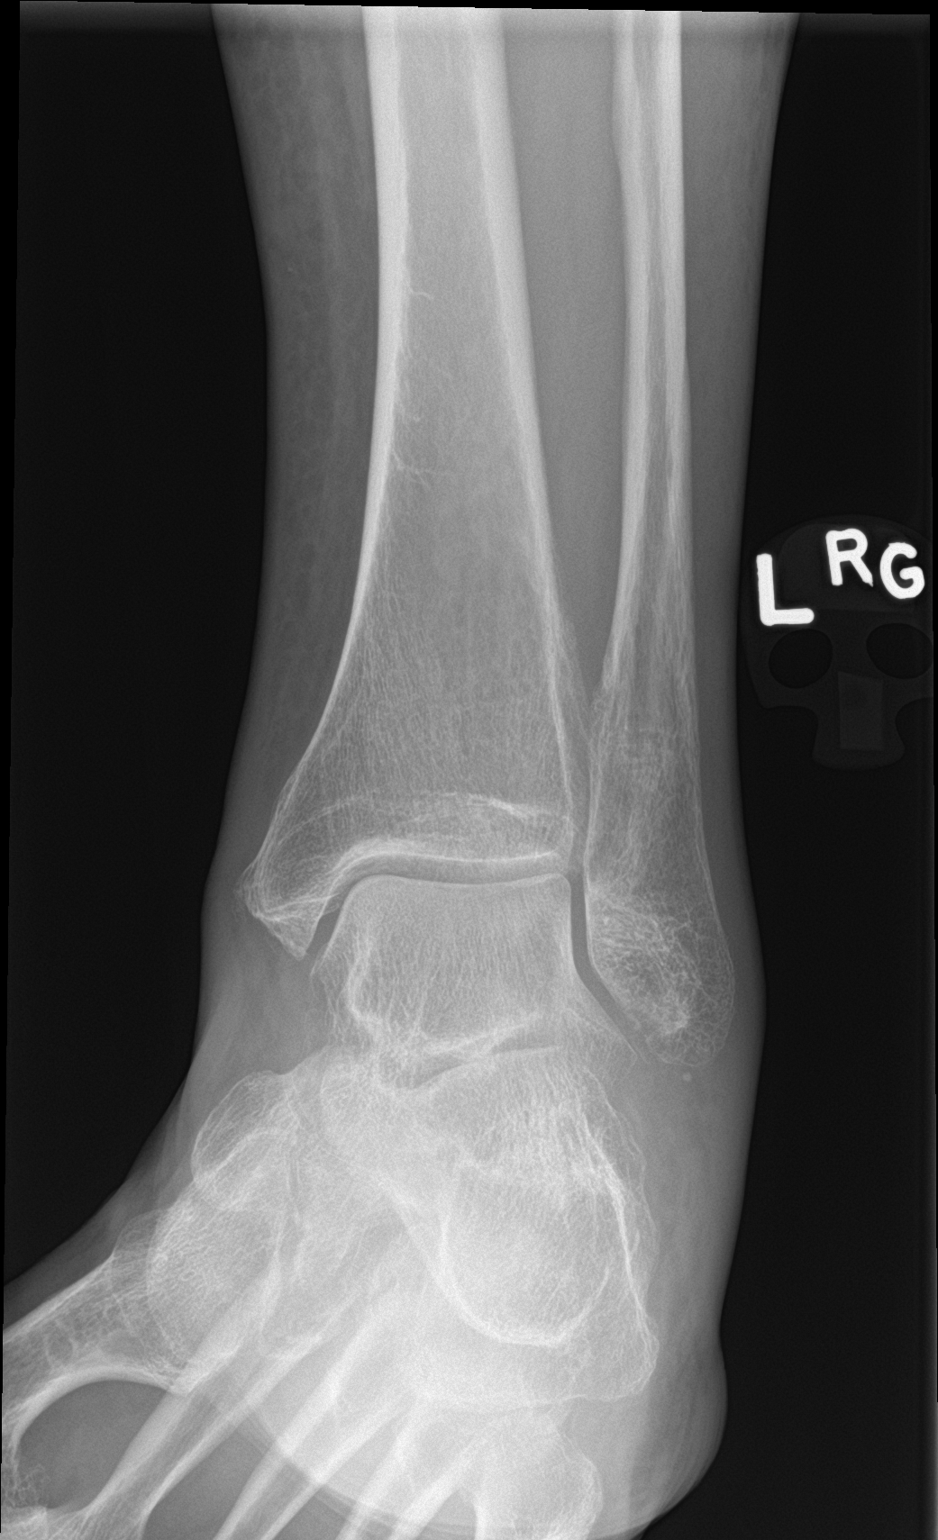

[ankle lat]
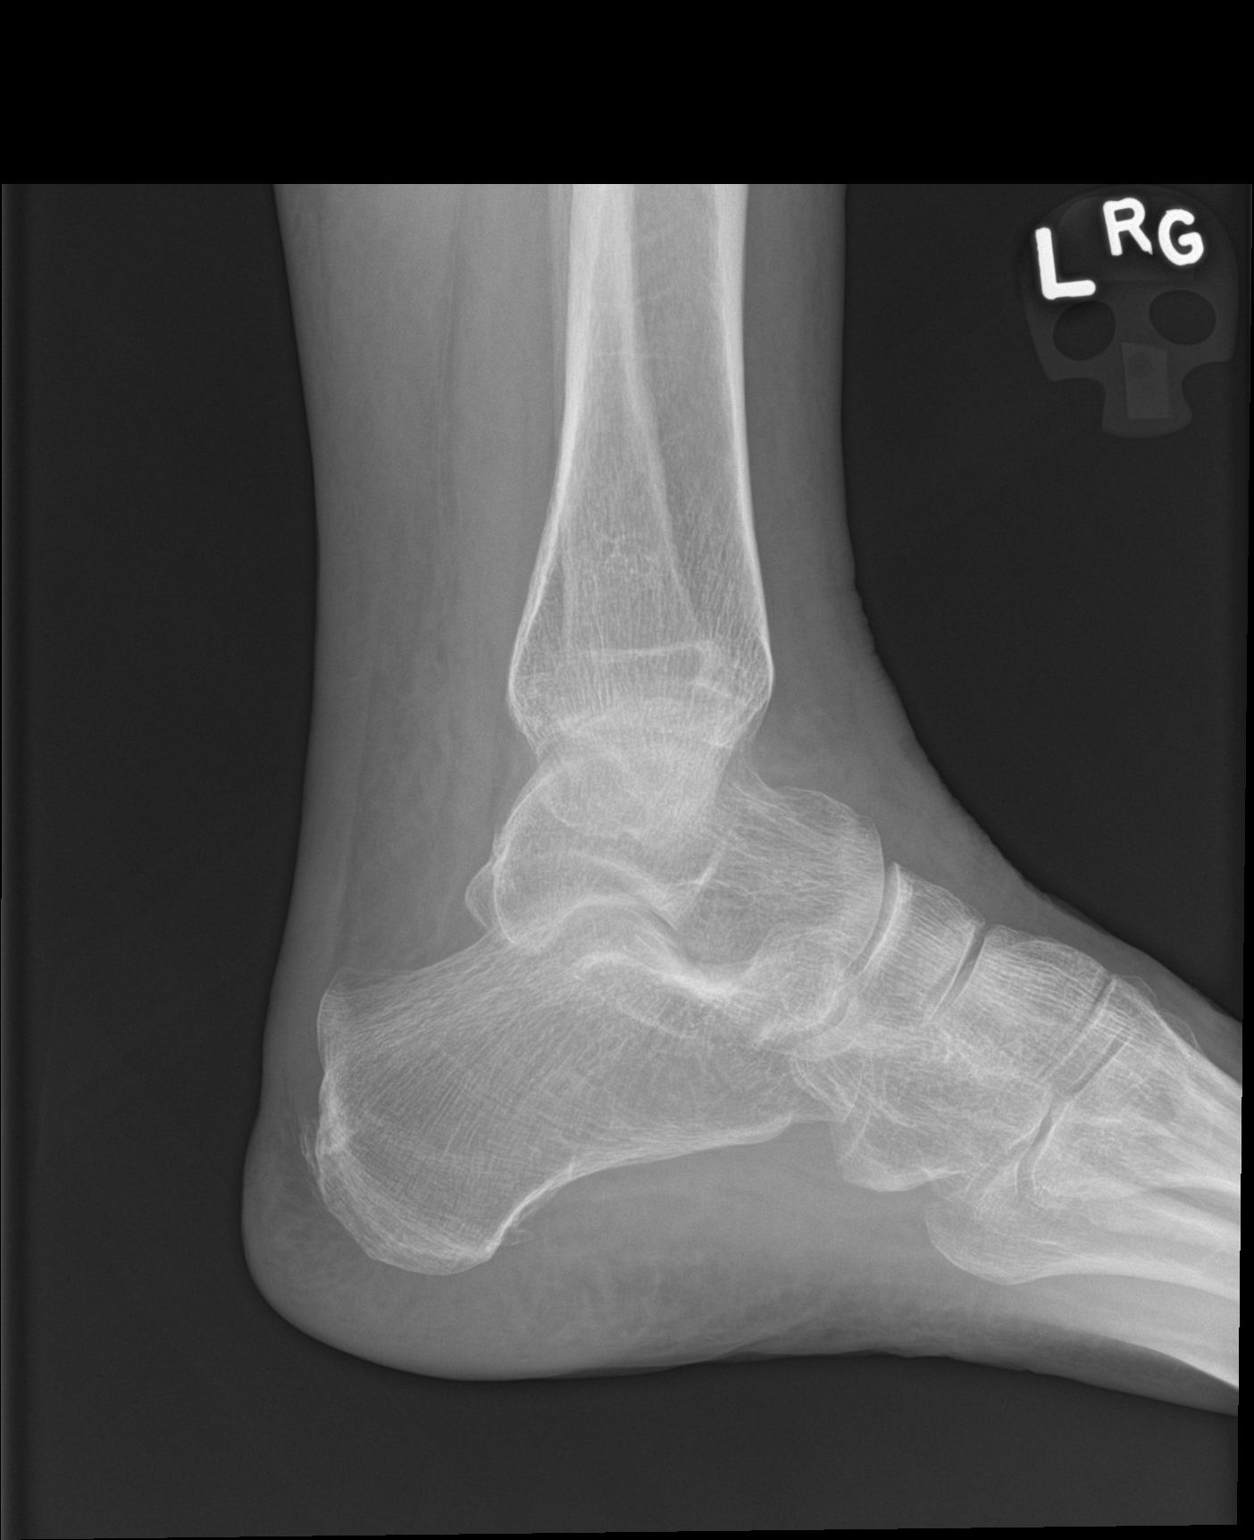

[3 of 3 positions shown; findings below may reference images not displayed]

FINDINGS: The bones are demineralized. No evidence of acute fracture or
dislocation. Minimal spurring of the malleoli without significant
ankle or hindfoot arthropathy. Mild diffuse nonspecific soft tissue
edema.
IMPRESSION: No acute osseous findings or focal soft tissue abnormality.

## 2019-04-17 ENCOUNTER — Other Ambulatory Visit: Payer: Self-pay | Admitting: Family Medicine

## 2019-04-17 DIAGNOSIS — F902 Attention-deficit hyperactivity disorder, combined type: Secondary | ICD-10-CM

## 2019-04-19 MED ORDER — LISDEXAMFETAMINE DIMESYLATE 70 MG PO CAPS: 70.0000 mg | ORAL_CAPSULE | Freq: Every day | ORAL | 0 refills | Status: DC

## 2019-04-19 NOTE — Telephone Encounter (Signed)
Patient has follow up on 04/28/2019. Please advise.

## 2019-04-19 NOTE — Telephone Encounter (Signed)
Please schedule for 66-month follow-up for ADHD.  Can schedule his virtual visit.

## 2019-04-28 ENCOUNTER — Telehealth (INDEPENDENT_AMBULATORY_CARE_PROVIDER_SITE_OTHER): Payer: BLUE CROSS/BLUE SHIELD | Admitting: Family Medicine

## 2019-04-28 ENCOUNTER — Encounter: Payer: Self-pay | Admitting: Family Medicine

## 2019-04-28 VITALS — BP 117/62 | HR 76 | Temp 98.2°F | Ht 67.0 in | Wt 144.3 lb

## 2019-04-28 DIAGNOSIS — D509 Iron deficiency anemia, unspecified: Secondary | ICD-10-CM

## 2019-04-28 DIAGNOSIS — N179 Acute kidney failure, unspecified: Secondary | ICD-10-CM | POA: Diagnosis not present

## 2019-04-28 DIAGNOSIS — I10 Essential (primary) hypertension: Secondary | ICD-10-CM

## 2019-04-28 DIAGNOSIS — F902 Attention-deficit hyperactivity disorder, combined type: Secondary | ICD-10-CM

## 2019-04-28 DIAGNOSIS — J4521 Mild intermittent asthma with (acute) exacerbation: Secondary | ICD-10-CM | POA: Diagnosis not present

## 2019-04-28 MED ORDER — LISDEXAMFETAMINE DIMESYLATE 70 MG PO CAPS: 70.0000 mg | ORAL_CAPSULE | Freq: Every day | ORAL | 0 refills | Status: DC

## 2019-04-28 MED ORDER — HYDROXYZINE HCL 10 MG PO TABS: 10.0000 mg | ORAL_TABLET | Freq: Three times a day (TID) | ORAL | 3 refills | Status: DC | PRN

## 2019-04-28 MED ORDER — BUDESONIDE-FORMOTEROL FUMARATE 160-4.5 MCG/ACT IN AERO: 2.0000 | INHALATION_SPRAY | Freq: Two times a day (BID) | RESPIRATORY_TRACT | 5 refills | Status: DC

## 2019-04-28 NOTE — Progress Notes (Signed)
Virtual Visit via Video Note  I connected with [name] on 04/28/19 at  9:50 AM EDT by a video enabled telemedicine application and verified that I am speaking with the correct person using two identifiers.   I discussed the limitations of evaluation and management by telemedicine and the availability of in person appointments. The patient expressed understanding and agreed to proceed.  Pt was at home and I was in my office for the virtual visit.     Subjective:    CC: F/U ADHD   HPI:  ADHD - Reports symptoms are well controlled on current regime. Denies any problems with insomnia, chest pain, palpitations, or SOB.    Hypertension- Pt denies chest pain, SOB, dizziness, or heart palpitations.  Taking meds as directed w/o problems.  Denies medication side effects.    Iron deficiency anemia-would refer to GI for colonoscopy.  Also due to recheck renal function. Last check in November and it was mildly elevated.  She had fallen around that time and looked a little bit dry on her blood work.  We were supposed to recheck it but have not.  Plus she is on lisinopril HCTZ.  She has been staying home and staying safe.  Past medical history, Surgical history, Family history not pertinant except as noted below, Social history, Allergies, and medications have been entered into the medical record, reviewed, and corrections made.   Review of Systems: No fevers, chills, night sweats, weight loss, chest pain, or shortness of breath.   Objective:    General: Speaking clearly in complete sentences without any shortness of breath.  Alert and oriented x3.  Normal judgment. No apparent acute distress.  Well-groomed.    Impression and Recommendations:    ADHD - Well controlled. Continue current regimen. Follow up in  4 months.     HTN - Well controlled. Continue current regimen. Follow up in  4 months.  Due to recheck BMP every 6 months because she is on ACE inhibitor and diuretic.  She looks a  little dry on the last set of labs so we may need to look at whether or not we need to decrease her hydrochlorothiazide and her blood pressure pill.  Iron def anemia - due to f/u with GI consult. She will call to schedule.  Iron levels had come up very nicely with supplementation but just reminded her of the importance of seeing GI and getting screened for colon cancer as well.  She says she will try to call and schedule this summer.  Asthma - doing well. Just needs a refill on her Symbicort.     AKI - due to recheck renal function.  I discussed the assessment and treatment plan with the patient. The patient was provided an opportunity to ask questions and all were answered. The patient agreed with the plan and demonstrated an understanding of the instructions.   The patient was advised to call back or seek an in-person evaluation if the symptoms worsen or if the condition fails to improve as anticipated.   Nani Gasser, MD

## 2019-04-30 ENCOUNTER — Other Ambulatory Visit: Payer: Self-pay | Admitting: Family Medicine

## 2019-05-21 ENCOUNTER — Encounter: Payer: Self-pay | Admitting: Family Medicine

## 2019-05-24 ENCOUNTER — Encounter: Payer: Self-pay | Admitting: Family Medicine

## 2019-05-24 ENCOUNTER — Ambulatory Visit (INDEPENDENT_AMBULATORY_CARE_PROVIDER_SITE_OTHER): Payer: BC Managed Care – PPO | Admitting: Family Medicine

## 2019-05-24 VITALS — BP 115/71 | HR 84 | Temp 98.3°F | Wt 145.0 lb

## 2019-05-24 DIAGNOSIS — Z7409 Other reduced mobility: Secondary | ICD-10-CM

## 2019-05-24 DIAGNOSIS — L739 Follicular disorder, unspecified: Secondary | ICD-10-CM | POA: Diagnosis not present

## 2019-05-24 DIAGNOSIS — W19XXXA Unspecified fall, initial encounter: Secondary | ICD-10-CM | POA: Diagnosis not present

## 2019-05-24 MED ORDER — DOXYCYCLINE HYCLATE 100 MG PO TABS: 100.0000 mg | ORAL_TABLET | Freq: Two times a day (BID) | ORAL | 0 refills | Status: DC

## 2019-05-24 NOTE — Progress Notes (Signed)
Glenda Fernandez is a 58 y.o. female who presents to [facility] today for skin lesion on face as well as frequent falls.  Patient has an erythematous tender swelling on her upper lip just inferior to her nose.  She notes this is developed over the last 2 days.  She tried to drain it which made it worse.  She denies fevers or chills but does note that it is tender.  She is not tried any other treatment for it yet.  Additionally over the past month she notes that she is falling a bit more.  She denies having any weakness or numbness to her arms or lower extremities.  She notes her symptoms are not consistent to prior to her cervical fusion.  She notes that twice she is tripped going up some stairs and that she feels as though she loses her balance she has trouble regaining it quickly enough to prevent a fall.  She would like to do what she can to prevent further falls if possible.   ROS as above:  Exam:  BP 115/71   Pulse 84   Temp 98.3 F (36.8 C) (Oral)   Wt 145 lb (65.8 kg)   BMI 22.71 kg/m  Wt Readings from Last 5 Encounters:  05/24/19 145 lb (65.8 kg)  04/28/19 144 lb 4.8 oz (65.5 kg)  01/25/19 142 lb (64.4 kg)  12/31/18 145 lb (65.8 kg)  12/28/18 145 lb (65.8 kg)    Gen: Well NAD HEENT: EOMI,  MMM Lungs: Normal work of breathing. CTABL Heart: RRR no MRG Abd: NABS, Soft. Nondistended, Nontender Exts: Brisk capillary refill, warm and well perfused.  MSK/neuro: Alert and oriented normal coordination balance and gait.  Lower extremity upper semi-strength reflexes and sensation are equal and normal throughout. Skin: Erythematous indurated upper lip inferior to nostril no fluctuance palpated.  No expressible pus.  See below.       Assessment and Plan: 58 y.o. female with  Skin lesion face is folliculitis developing into an abscess.  I think we have caught  it early enough that is not a true abscess yet.  Is not drainable at this time and will try treatment with empiric doxycycline antibiotics.  If worsening next step would be incision and drainage.  Frequent falls: No obvious neurological impairment however I believe that she is just a little bit unsteady and a little bit weak and cannot recover from a small trip or bumped foot while walking and tends to topple over.  I think she will benefit from trial of physical therapy.  Will refer to physical therapy and recheck if not improving.  PDMP not reviewed this encounter. Orders Placed This Encounter  Procedures  . Ambulatory referral to Physical Therapy    Referral Priority:   Routine    Referral Type:   Physical Medicine    Referral Reason:   Specialty Services Required    Requested Specialty:   Physical Therapy   Meds ordered this encounter  Medications  . doxycycline (VIBRA-TABS) 100 MG tablet    Sig: Take 1 tablet (100 mg total) by mouth 2 (two) times daily.    Dispense:  14 tablet    Refill:  0     Historical information moved to improve visibility of documentation.  Past Medical History:  Diagnosis Date  . Arthritis    osteo  . Asthma   . Depression   . GERD (gastroesophageal reflux disease)   .  Iron deficiency anemia 09/11/2012   Past Surgical History:  Procedure Laterality Date  . BACK SURGERY  2001  . BLADDER SUSPENSION    . BREAST ENHANCEMENT SURGERY Left 1879  . CARPAL TUNNEL RELEASE Right 11/01/2016   Procedure: CARPAL TUNNEL RELEASE;  Surgeon: Jodi GeraldsJohn Graves, MD;  Location: [facility];  Service: Orthopedics;  Laterality: Right;  . HAND SURGERY Left 2013   for fracture, [place] [facility]  . NECK SURGERY    . ORIF WRIST FRACTURE Right 11/01/2016   Procedure: OPEN REDUCTION INTERNAL FIXATION (ORIF) WRIST FRACTURE;  Surgeon: Jodi GeraldsJohn Graves, MD;  Location: [facility];  Service: Orthopedics;  Laterality: Right;  . SHOULDER SURGERY  2005   Social History    Tobacco Use  . Smoking status: Current Every Day Smoker    Packs/day: 1.00    Years: 40.00    Pack years: 40.00  . Smokeless tobacco: Never Used  Substance Use Topics  . Alcohol use: No   family history includes Alcohol abuse in her unknown relative; Bipolar disorder in her daughter; Breast cancer in her mother; Coronary artery disease in her father; Depression in her daughter; Hyperlipidemia in her father and mother; Kidney failure in her mother; Lung cancer in her father.  Medications: Current Outpatient Medications  Medication Sig Dispense Refill  . albuterol (PROVENTIL HFA;VENTOLIN HFA) 108 (90 Base) MCG/ACT inhaler Inhale 2 puffs into the lungs every 6 (six) hours as needed for wheezing or shortness of breath. 8.5 Inhaler 1  . budesonide-formoterol (SYMBICORT) 160-4.5 MCG/ACT inhaler Inhale 2 puffs into the lungs 2 (two) times daily. 1 Inhaler 5  . diclofenac sodium (VOLTAREN) 1 % GEL Apply 4 g topically 4 (four) times daily. To affected joint. 100 g 11  . FLUoxetine (PROZAC) 40 MG capsule TAKE 2 CAPSULES (80 MG TOTAL) BY MOUTH DAILY. 180 capsule 1  . hydrOXYzine (ATARAX/VISTARIL) 10 MG tablet Take 1-2 tablets (10-20 mg total) by mouth every 8 (eight) hours as needed for anxiety. 45 tablet 3  . ipratropium-albuterol (DUONEB) 0.5-2.5 (3) MG/3ML SOLN TAKE 3 MLS BY NEBULIZATION EVERY 4 (FOUR) HOURS AS NEEDED. 360 mL 0  . [START ON 07/26/2019] lisdexamfetamine (VYVANSE) 70 MG capsule Take 1 capsule (70 mg total) by mouth daily. 30 capsule 0  . [START ON 06/26/2019] lisdexamfetamine (VYVANSE) 70 MG capsule Take 1 capsule (70 mg total) by mouth daily. 30 capsule 0  . [START ON 05/28/2019] lisdexamfetamine (VYVANSE) 70 MG capsule Take 1 capsule (70 mg total) by mouth daily. 30 capsule 0  . lisdexamfetamine (VYVANSE) 70 MG capsule Take 1 capsule (70 mg total) by mouth daily. 30 capsule 0  . lisinopril-hydrochlorothiazide (PRINZIDE,ZESTORETIC) 20-25 MG tablet TAKE 1 TABLET BY MOUTH DAILY. 90  tablet 1  . lovastatin (MEVACOR) 40 MG tablet TAKE 1 TABLET BY MOUTH EVERYDAY AT BEDTIME 90 tablet 3  . ranitidine (ZANTAC) 150 MG tablet TAKE 1 TABLET BY MOUTH TWICE A DAY 90 tablet 3  . doxycycline (VIBRA-TABS) 100 MG tablet Take 1 tablet (100 mg total) by mouth 2 (two) times daily. 14 tablet 0   No current facility-administered medications for this visit.    No Known Allergies   Discussed warning signs or symptoms. Please see discharge instructions. Patient expresses understanding.

## 2019-05-24 NOTE — Patient Instructions (Addendum)
Thank you for coming in today.  Take the doxycycline twice daily for infection.  Let me know if you do not improve.   Attend PT for balance.    Recheck if not improving.     Skin Abscess  A skin abscess is an infected area on or under your skin that contains a collection of pus and other material. An abscess may also be called a furuncle, carbuncle, or boil. An abscess can occur in or on almost any part of your body. Some abscesses break open (rupture) on their own. Most continue to get worse unless they are treated. The infection can spread deeper into the body and eventually into your blood, which can make you feel ill. Treatment usually involves draining the abscess. What are the causes? An abscess occurs when germs, like bacteria, pass through your skin and cause an infection. This may be caused by:  A scrape or cut on your skin.  A puncture wound through your skin, including a needle injection or insect bite.  Blocked oil or sweat glands.  Blocked and infected hair follicles.  A cyst that forms beneath your skin (sebaceous cyst) and becomes infected. What increases the risk? This condition is more likely to develop in people who:  Have a weak body defense system (immune system).  Have diabetes.  Have dry and irritated skin.  Get frequent injections or use illegal IV drugs.  Have a foreign body in a wound, such as a splinter.  Have problems with their lymph system or veins. What are the signs or symptoms? Symptoms of this condition include:  A painful, firm bump under the skin.  A bump with pus at the top. This may break through the skin and drain. Other symptoms include:  Redness surrounding the abscess site.  Warmth.  Swelling of the lymph nodes (glands) near the abscess.  Tenderness.  A sore on the skin. How is this diagnosed? This condition may be diagnosed based on:  A physical exam.  Your medical history.  A sample of pus. This may be used  to find out what is causing the infection.  Blood tests.  Imaging tests, such as an ultrasound, CT scan, or MRI. How is this treated? A small abscess that drains on its own may not need treatment. Treatment for larger abscesses may include:  Moist heat or heat pack applied to the area several times a day.  A procedure to drain the abscess (incision and drainage).  Antibiotic medicines. For a severe abscess, you may first get antibiotics through an IV and then change to antibiotics by mouth. Follow these instructions at home: Medicines   Take over-the-counter and prescription medicines only as told by your health care provider.  If you were prescribed an antibiotic medicine, take it as told by your health care provider. Do not stop taking the antibiotic even if you start to feel better. Abscess care   If you have an abscess that has not drained, apply heat to the affected area. Use the heat source that your health care provider recommends, such as a moist heat pack or a heating pad. ? Place a towel between your skin and the heat source. ? Leave the heat on for 20-30 minutes. ? Remove the heat if your skin turns bright red. This is especially important if you are unable to feel pain, heat, or cold. You may have a greater risk of getting burned.  Follow instructions from your health care provider about how to take  care of your abscess. Make sure you: ? Cover the abscess with a bandage (dressing). ? Change your dressing or gauze as told by your health care provider. ? Wash your hands with soap and water before you change the dressing or gauze. If soap and water are not available, use hand sanitizer.  Check your abscess every day for signs of a worsening infection. Check for: ? More redness, swelling, or pain. ? More fluid or blood. ? Warmth. ? More pus or a bad smell. General instructions  To avoid spreading the infection: ? Do not share personal care items, towels, or hot tubs  with others. ? Avoid making skin contact with other people.  Keep all follow-up visits as told by your health care provider. This is important. Contact a health care provider if you have:  More redness, swelling, or pain around your abscess.  More fluid or blood coming from your abscess.  Warm skin around your abscess.  More pus or a bad smell coming from your abscess.  A fever.  Muscle aches.  Chills or a general ill feeling. Get help right away if you:  Have severe pain.  See red streaks on your skin spreading away from the abscess. Summary  A skin abscess is an infected area on or under your skin that contains a collection of pus and other material.  A small abscess that drains on its own may not need treatment.  Treatment for larger abscesses may include having a procedure to drain the abscess and taking an antibiotic. This information is not intended to replace advice given to you by your health care provider. Make sure you discuss any questions you have with your health care provider. Document Released: 09/11/2005 Document Revised: 01/15/2018 Document Reviewed: 01/15/2018   Fall Prevention in Hospitals, Adult Being a patient in the hospital puts you at risk for falling. Falls can cause serious injury and harm, but they can be prevented. It is important to understand what puts you at risk for falling and what you and your health care team can do to prevent you from falling. If you or a loved one falls at the hospital, it is important to tell hospital staff about it. What increases the risk for falls? Certain conditions and treatments may increase your risk of falling in the hospital. These include:  Being in an unfamiliar environment, especially when using the bathroom at night.  Having surgery.  Being on bed rest.  Taking many medicines or certain types of medicines, such as sleeping pills.  Having tubes in place, such as IV lines or catheters. Other risk  factors for falls in a hospital include:  Having difficulty with hearing or vision.  Having a change in thinking or behavior, such as confusion.  Having depression.  Having trouble with balance.  Being a female.  Feeling dizzy.  Needing to use the toilet frequently.  Having fallen during the past three months.  Having low blood pressure. What are some strategies for preventing falls? If you or a loved one has to stay in the hospital:  Ask about which fall prevention strategies will be in place. Do not hesitate to speak up if you notice that the fall prevention plan has changed.  Ask for help moving around, especially after surgery or when feeling unwell.  If you have been asked to call for help when getting up, do not get up by yourself. Asking for help with getting up is for your safety, and the staff is  there to help you.  Wear nonskid footwear.  Get up slowly, and sit at the side of the bed for a few minutes before standing up.  Keep items you need, such as the nurse call button or a phone, close to you so that you do not need to reach for them.  Wear eyeglasses or hearing aids if you have them.  Have someone stay in the hospital with you or your loved one.  Ask if sleeping pills or other medicines that can cause confusion are necessary. What does the hospital staff do to help prevent falls? Hospitals have systems in place to prevent falls and accidents, which may involve:  Discussing your fall risks and making a personalized fall prevention plan.  Checking in regularly to see if you need help.  Placing an arm band on your wrist or a sign near your room to alert other staff of your needs.  Using an alarm on your hospital bed. This is an alarm that goes off if you get out of bed and forget to call for help.  Keeping the bed in a low and locked position.  Keeping the area around the bed and bathroom well-lit and free from clutter.  Keeping your room quiet, so that  you can sleep and be well-rested.  Using safety equipment, such as: ? A belt around your waist. ? Walkers, crutches, and other devices for support. ? Safety beds, such as low beds or cushions on the floor next to the bed.  Having a staff person stay with you (one-on-one observation), even when you are using the bathroom. This is for your safety.  Using video monitoring. This allows a staff member to come to help you if you need help. What other actions can I take to lower my risk of falls?  Check in regularly with your health care provider or pharmacist to review all of the medicines that you take.  Make sure that you have a regular exercise program to stay fit. This will help you maintain your balance.  Talk with a physical therapist or trainer if recommended by your health care provider. They can help you to improve your strength, balance, and endurance.  If you are over age 41: ? Ask your health care provider if you need a calcium or vitamin D supplement. ? Have your eyes and hearing checked every year. ? Have your feet checked every year. Where to find more information You can find more information about fall prevention from the Centers for Disease Control and Prevention: ImproveLook.cz Summary  Being in an unfamiliar environment, such as the hospital, increases your risk for falling.  If you have been asked to call for help when getting up, do not get up by yourself. Asking for help with getting up is for your safety, and the staff is there to help you.  Ask about which fall prevention strategies will be in place. Do not hesitate to speak up if you notice that the fall prevention plan has changed.  If you or a loved one falls, tell the hospital staff. This is important. This information is not intended to replace advice given to you by your health care provider. Make sure you discuss any questions you have with your health care provider. Document Released: 11/29/2000  Document Revised: 07/16/2017 Document Reviewed: 07/16/2017 Elsevier Interactive Patient Education  2019 [employer].   Chartered certified accountant Patient Education  [employer].

## 2019-06-22 ENCOUNTER — Telehealth: Payer: Self-pay

## 2019-06-22 NOTE — Telephone Encounter (Signed)
Kaysee called and left a message stating she doesn't have any Prozac left. She states the pharmacy said it is to early to refill. I left her a message to call back. I can get it through [facility] but we will need to figure out which pharmacy.

## 2019-07-22 ENCOUNTER — Other Ambulatory Visit: Payer: Self-pay | Admitting: Family Medicine

## 2019-07-22 DIAGNOSIS — I10 Essential (primary) hypertension: Secondary | ICD-10-CM

## 2019-07-29 ENCOUNTER — Other Ambulatory Visit: Payer: Self-pay | Admitting: Family Medicine

## 2019-08-24 ENCOUNTER — Other Ambulatory Visit: Payer: Self-pay | Admitting: Family Medicine

## 2019-08-24 DIAGNOSIS — J4521 Mild intermittent asthma with (acute) exacerbation: Secondary | ICD-10-CM

## 2019-09-05 ENCOUNTER — Encounter: Payer: Self-pay | Admitting: Family Medicine

## 2019-09-05 ENCOUNTER — Other Ambulatory Visit: Payer: Self-pay | Admitting: Family Medicine

## 2019-09-05 DIAGNOSIS — Z1239 Encounter for other screening for malignant neoplasm of breast: Secondary | ICD-10-CM

## 2019-09-05 DIAGNOSIS — F902 Attention-deficit hyperactivity disorder, combined type: Secondary | ICD-10-CM

## 2019-09-06 ENCOUNTER — Encounter: Payer: Self-pay | Admitting: Family Medicine

## 2019-09-06 DIAGNOSIS — I10 Essential (primary) hypertension: Secondary | ICD-10-CM

## 2019-09-06 DIAGNOSIS — Z1239 Encounter for other screening for malignant neoplasm of breast: Secondary | ICD-10-CM

## 2019-09-06 NOTE — Telephone Encounter (Signed)
[facility] pharmacy requesting med refill for lisdexamfetamine.

## 2019-09-06 NOTE — Telephone Encounter (Signed)
Please call patient and get her scheduled for follow-up she is due for her 25-month follow-up.

## 2019-09-07 ENCOUNTER — Other Ambulatory Visit: Payer: Self-pay

## 2019-09-07 DIAGNOSIS — F902 Attention-deficit hyperactivity disorder, combined type: Secondary | ICD-10-CM

## 2019-09-07 MED ORDER — LISDEXAMFETAMINE DIMESYLATE 70 MG PO CAPS: 70.0000 mg | ORAL_CAPSULE | Freq: Every day | ORAL | 0 refills | Status: DC

## 2019-09-07 NOTE — Telephone Encounter (Signed)
Glenda Fernandez request a refill on Vyvanse.

## 2019-09-07 NOTE — Telephone Encounter (Signed)
Med refilled for one mo

## 2019-09-07 NOTE — Telephone Encounter (Signed)
Pt scheduled next month

## 2019-09-29 ENCOUNTER — Other Ambulatory Visit: Payer: Self-pay | Admitting: Family Medicine

## 2019-09-29 DIAGNOSIS — Z1231 Encounter for screening mammogram for malignant neoplasm of breast: Secondary | ICD-10-CM

## 2019-09-30 ENCOUNTER — Other Ambulatory Visit: Payer: Self-pay

## 2019-09-30 ENCOUNTER — Ambulatory Visit (INDEPENDENT_AMBULATORY_CARE_PROVIDER_SITE_OTHER): Payer: BC Managed Care – PPO

## 2019-09-30 DIAGNOSIS — Z1231 Encounter for screening mammogram for malignant neoplasm of breast: Secondary | ICD-10-CM | POA: Diagnosis not present

## 2019-10-03 ENCOUNTER — Encounter: Payer: Self-pay | Admitting: Family Medicine

## 2019-10-05 DIAGNOSIS — I10 Essential (primary) hypertension: Secondary | ICD-10-CM | POA: Diagnosis not present

## 2019-10-06 ENCOUNTER — Telehealth (INDEPENDENT_AMBULATORY_CARE_PROVIDER_SITE_OTHER): Payer: BC Managed Care – PPO | Admitting: Family Medicine

## 2019-10-06 ENCOUNTER — Other Ambulatory Visit: Payer: Self-pay

## 2019-10-06 ENCOUNTER — Encounter: Payer: Self-pay | Admitting: Family Medicine

## 2019-10-06 ENCOUNTER — Other Ambulatory Visit: Payer: Self-pay | Admitting: Family Medicine

## 2019-10-06 VITALS — BP 111/70 | HR 95 | Temp 97.8°F | Wt 148.0 lb

## 2019-10-06 DIAGNOSIS — D509 Iron deficiency anemia, unspecified: Secondary | ICD-10-CM

## 2019-10-06 DIAGNOSIS — K219 Gastro-esophageal reflux disease without esophagitis: Secondary | ICD-10-CM | POA: Insufficient documentation

## 2019-10-06 DIAGNOSIS — F902 Attention-deficit hyperactivity disorder, combined type: Secondary | ICD-10-CM | POA: Diagnosis not present

## 2019-10-06 DIAGNOSIS — I10 Essential (primary) hypertension: Secondary | ICD-10-CM

## 2019-10-06 DIAGNOSIS — G2581 Restless legs syndrome: Secondary | ICD-10-CM | POA: Diagnosis not present

## 2019-10-06 DIAGNOSIS — J4521 Mild intermittent asthma with (acute) exacerbation: Secondary | ICD-10-CM

## 2019-10-06 DIAGNOSIS — K2100 Gastro-esophageal reflux disease with esophagitis, without bleeding: Secondary | ICD-10-CM

## 2019-10-06 LAB — COMPLETE METABOLIC PANEL WITH GFR
AG Ratio: 1.6 (calc) (ref 1.0–2.5)
ALT: 16 U/L (ref 6–29)
AST: 19 U/L (ref 10–35)
Albumin: 4.1 g/dL (ref 3.6–5.1)
Alkaline phosphatase (APISO): 79 U/L (ref 37–153)
BUN/Creatinine Ratio: 17 (calc) (ref 6–22)
BUN: 17 mg/dL (ref 7–25)
CO2: 29 mmol/L (ref 20–32)
Calcium: 9.2 mg/dL (ref 8.6–10.4)
Chloride: 102 mmol/L (ref 98–110)
Creat: 1.03 mg/dL — ABNORMAL HIGH (ref 0.50–0.99)
GFR, Est African American: 68 mL/min/{1.73_m2} (ref >=60)
GFR, Est Non African American: 59 mL/min/{1.73_m2} — ABNORMAL LOW (ref >=60)
Globulin: 2.5 g/dL (calc) (ref 1.9–3.7)
Glucose, Bld: 93 mg/dL (ref 65–99)
Potassium: 4.3 mmol/L (ref 3.5–5.3)
Sodium: 139 mmol/L (ref 135–146)
Total Bilirubin: 0.3 mg/dL (ref 0.2–1.2)
Total Protein: 6.6 g/dL (ref 6.1–8.1)

## 2019-10-06 LAB — CBC
HCT: 35.7 % (ref 35.0–45.0)
Hemoglobin: 12 g/dL (ref 11.7–15.5)
MCH: 30.6 pg (ref 27.0–33.0)
MCHC: 33.6 g/dL (ref 32.0–36.0)
MCV: 91.1 fL (ref 80.0–100.0)
MPV: 10.3 fL (ref 7.5–12.5)
Platelets: 268 10*3/uL (ref 140–400)
RBC: 3.92 10*6/uL (ref 3.80–5.10)
RDW: 13.1 % (ref 11.0–15.0)
WBC: 7.5 10*3/uL (ref 3.8–10.8)

## 2019-10-06 MED ORDER — LISDEXAMFETAMINE DIMESYLATE 70 MG PO CAPS: 70.0000 mg | ORAL_CAPSULE | Freq: Every day | ORAL | 0 refills | Status: DC

## 2019-10-06 MED ORDER — OMEPRAZOLE 40 MG PO CPDR: 40.0000 mg | DELAYED_RELEASE_CAPSULE | Freq: Every day | ORAL | 1 refills | Status: DC

## 2019-10-06 MED ORDER — ROPINIROLE HCL 0.25 MG PO TABS: 0.2500 mg | ORAL_TABLET | Freq: Every day | ORAL | 1 refills | Status: DC

## 2019-10-06 NOTE — Assessment & Plan Note (Signed)
Continue current regimen.  Refill sent for the next 90 days.

## 2019-10-06 NOTE — Assessment & Plan Note (Signed)
Based on her description it does sound like her symptoms are consistent with restless leg.  We discussed potential treatment options and that it also does not have to be treated.  Is purely for symptom control and whether or not it is bothering her.  She would like to try ropinirole.  She says her daughter and her husband have both been on it at one point.  Prescription sent to pharmacy we will start with low-dose of 0.25 mg and okay to increase to 2 tabs if needed.  Plan to follow-up again in 3 to 4 months.

## 2019-10-06 NOTE — Assessment & Plan Note (Signed)
Last hemoglobin looked fantastic but did encourage her to please still schedule her mammogram.

## 2019-10-06 NOTE — Assessment & Plan Note (Signed)
Zantac is currently on recall we will send over prescription for omeprazole 40 mg.  If she is doing well then consider dropping down to 20 mg and eventually switching back to an H2 blocker.  Discussed the long-term potential effects of chronic PPI use.

## 2019-10-06 NOTE — Assessment & Plan Note (Signed)
No recent flares. Using her albuterol once every 3 weeks.

## 2019-10-06 NOTE — Progress Notes (Signed)
Virtual Visit via Video Note  I connected with Glenda Fernandez on 10/06/19 at 11:10 AM EDT by a video enabled telemedicine application and verified that I am speaking with the correct person using two identifiers.   I discussed the limitations of evaluation and management by telemedicine and the availability of in person appointments. The patient expressed understanding and agreed to proceed.     Established Patient Office Visit  Subjective:  Patient ID: Glenda Fernandez, female    DOB: 19-Dec-1960  Age: 58 y.o. MRN: [id number]  CC: No chief complaint on file.   HPI Glenda Fernandez presents for f/u ADHD.  ADHD - Reports symptoms are well controlled on current regime. Denies any problems with insomnia, chest pain, palpitations, or SOB.    Hypertension- Pt denies chest pain, SOB, dizziness, or heart palpitations.  Taking meds as directed w/o problems.  Denies medication side effects.   Hx of iron def anemia.  Repeat hemoglobin is back to normal.  She has not called to make her colonoscopy appointment yet.  She also wanted to discuss her reflux medication.  Zantac was recalled so she is been taking her daughters omeprazole 40 mg and that seems to work well she like to have a prescription for that at least in the interim.  She getting some leg jerks and feels like she has to move them. They feel like they are "crawly". No pain.  Doesn't happen all the time. Her husband used to have similar sxs.  She wonders if she could have RLS.  She has noticed it more for the last 6 months.     Past Medical History:  Diagnosis Date  . Arthritis    osteo  . Asthma   . Depression   . GERD (gastroesophageal reflux disease)   . Iron deficiency anemia 09/11/2012    Past Surgical History:  Procedure Laterality Date  . BACK SURGERY  2001  . BLADDER SUSPENSION    . BREAST ENHANCEMENT SURGERY Left 1879  . CARPAL TUNNEL RELEASE Right 11/01/2016   Procedure: CARPAL TUNNEL RELEASE;  Surgeon: Dorna Leitz, MD;   Location: [facility];  Service: Orthopedics;  Laterality: Right;  . HAND SURGERY Left 2013   for fracture, [facility] [facility]  . NECK SURGERY    . ORIF WRIST FRACTURE Right 11/01/2016   Procedure: OPEN REDUCTION INTERNAL FIXATION (ORIF) WRIST FRACTURE;  Surgeon: Dorna Leitz, MD;  Location: [place];  Service: Orthopedics;  Laterality: Right;  . SHOULDER SURGERY  2005    Family History  Problem Relation Age of Onset  . Coronary artery disease Father   . Lung cancer Father        mesothelioma  . Hyperlipidemia Father   . Alcohol abuse Unknown        uncle  . Breast cancer Mother   . Kidney failure Mother   . Hyperlipidemia Mother   . Bipolar disorder Daughter   . Depression Daughter     Social History   Socioeconomic History  . Marital status: Married    Spouse name: Herbie Baltimore  . Number of children: 2  . Years of education: Not on file  . Highest education level: Not on file  Occupational History  . Occupation: Evidence Tech    Comment: [employer] [place] [employer]  . Financial resource strain: Not on file  . Food insecurity    Worry: Not on file    Inability: Not on file  . Transportation needs  Medical: Not on file    Non-medical: Not on file  Tobacco Use  . Smoking status: Current Every Day Smoker    Packs/day: 1.00    Years: 40.00    Pack years: 40.00  . Smokeless tobacco: Never Used  Substance and Sexual Activity  . Alcohol use: No  . Drug use: Not on file  . Sexual activity: Not on file  Lifestyle  . Physical activity    Days per week: Not on file    Minutes per session: Not on file  . Stress: Not on file  Relationships  . Social Musician on phone: Not on file    Gets together: Not on file    Attends religious service: Not on file    Active member of club or organization: Not on file    Attends meetings of clubs or organizations: Not on file    Relationship status: Not on file  . Intimate partner violence    Fear  of current or ex partner: Not on file    Emotionally abused: Not on file    Physically abused: Not on file    Forced sexual activity: Not on file  Other Topics Concern  . Not on file  Social History Narrative   Some college.  3 caffeine per day.  Walks 3 times per week for exercise.     Outpatient Medications Prior to Visit  Medication Sig Dispense Refill  . albuterol (PROVENTIL HFA;VENTOLIN HFA) 108 (90 Base) MCG/ACT inhaler Inhale 2 puffs into the lungs every 6 (six) hours as needed for wheezing or shortness of breath. 8.5 Inhaler 1  . budesonide-formoterol (SYMBICORT) 160-4.5 MCG/ACT inhaler TAKE 2 PUFFS BY MOUTH TWICE A DAY 30.6 Inhaler 1  . diclofenac sodium (VOLTAREN) 1 % GEL Apply 4 g topically 4 (four) times daily. To affected joint. 100 g 11  . FLUoxetine (PROZAC) 40 MG capsule TAKE 2 CAPSULES (80 MG TOTAL) BY MOUTH DAILY. 180 capsule 1  . hydrOXYzine (ATARAX/VISTARIL) 10 MG tablet TAKE 1-2 TABLETS (10-20 MG TOTAL) BY MOUTH EVERY 8 (EIGHT) HOURS AS NEEDED FOR ANXIETY. 45 tablet 3  . ipratropium-albuterol (DUONEB) 0.5-2.5 (3) MG/3ML SOLN TAKE 3 MLS BY NEBULIZATION EVERY 4 (FOUR) HOURS AS NEEDED. 360 mL 0  . lisdexamfetamine (VYVANSE) 70 MG capsule Take 1 capsule (70 mg total) by mouth daily. 30 capsule 0  . lisinopril-hydrochlorothiazide (ZESTORETIC) 20-25 MG tablet TAKE 1 TABLET BY MOUTH DAILY. 90 tablet 1  . lovastatin (MEVACOR) 40 MG tablet TAKE 1 TABLET BY MOUTH EVERYDAY AT BEDTIME 90 tablet 3  . lisdexamfetamine (VYVANSE) 70 MG capsule Take 1 capsule (70 mg total) by mouth daily. 30 capsule 0  . lisdexamfetamine (VYVANSE) 70 MG capsule Take 1 capsule (70 mg total) by mouth daily. 30 capsule 0  . lisdexamfetamine (VYVANSE) 70 MG capsule Take 1 capsule (70 mg total) by mouth daily. 30 capsule 0  . ranitidine (ZANTAC) 150 MG tablet TAKE 1 TABLET BY MOUTH TWICE A DAY 90 tablet 3   No facility-administered medications prior to visit.     No Known Allergies  ROS Review of  Systems    Objective:    Physical Exam  Constitutional: She is oriented to person, place, and time. She appears well-developed and well-nourished.  HENT:  Head: Normocephalic and atraumatic.  Eyes: Conjunctivae and EOM are normal.  Pulmonary/Chest: Effort normal.  Neurological: She is alert and oriented to person, place, and time.  Skin: Skin is dry. No pallor.  Psychiatric:  She has a normal mood and affect. Her behavior is normal.  Vitals reviewed.   BP 111/70   Pulse 95   Temp 97.8 F (36.6 C)   Wt 148 lb (67.1 kg)   BMI 23.18 kg/m  Wt Readings from Last 3 Encounters:  10/06/19 148 lb (67.1 kg)  05/24/19 145 lb (65.8 kg)  04/28/19 144 lb 4.8 oz (65.5 kg)     Health Maintenance Due  Topic Date Due  . COLONOSCOPY  05/22/2009  . PAP SMEAR-Modifier  07/14/2018    There are no preventive care reminders to display for this patient.  Lab Results  Component Value Date   TSH 0.55 10/29/2018   Lab Results  Component Value Date   WBC 7.5 10/05/2019   HGB 12.0 10/05/2019   HCT 35.7 10/05/2019   MCV 91.1 10/05/2019   PLT 268 10/05/2019   Lab Results  Component Value Date   NA 139 10/05/2019   K 4.3 10/05/2019   CO2 29 10/05/2019   GLUCOSE 93 10/05/2019   BUN 17 10/05/2019   CREATININE 1.03 (H) 10/05/2019   BILITOT 0.3 10/05/2019   ALKPHOS 52 05/20/2017   AST 19 10/05/2019   ALT 16 10/05/2019   PROT 6.6 10/05/2019   ALBUMIN 3.8 05/20/2017   CALCIUM 9.2 10/05/2019   Lab Results  Component Value Date   CHOL 214 (H) 05/20/2017   Lab Results  Component Value Date   HDL 44 (L) 05/20/2017   Lab Results  Component Value Date   LDLCALC 173 (H) 09/05/2016   Lab Results  Component Value Date   TRIG 177 (H) 05/20/2017   Lab Results  Component Value Date   CHOLHDL 4.9 05/20/2017   No results found for: HGBA1C    Assessment & Plan:   Problem List Items Addressed This Visit      Cardiovascular and Mediastinum   Essential hypertension - Primary     Her blood pressure looks fantastic.  Continue to monitor.        Respiratory   Mild intermittent asthma with acute exacerbation    No recent flares. Using her albuterol once every 3 weeks.         Digestive   GERD (gastroesophageal reflux disease)    Zantac is currently on recall we will send over prescription for omeprazole 40 mg.  If she is doing well then consider dropping down to 20 mg and eventually switching back to an H2 blocker.  Discussed the long-term potential effects of chronic PPI use.      Relevant Medications   omeprazole (PRILOSEC) 40 MG capsule     Other   RLS (restless legs syndrome)    Based on her description it does sound like her symptoms are consistent with restless leg.  We discussed potential treatment options and that it also does not have to be treated.  Is purely for symptom control and whether or not it is bothering her.  She would like to try ropinirole.  She says her daughter and her husband have both been on it at one point.  Prescription sent to pharmacy we will start with low-dose of 0.25 mg and okay to increase to 2 tabs if needed.  Plan to follow-up again in 3 to 4 months.      Relevant Medications   rOPINIRole (REQUIP) 0.25 MG tablet   Iron deficiency anemia    Last hemoglobin looked fantastic but did encourage her to please still schedule her mammogram.  ADHD (attention deficit hyperactivity disorder)    Continue current regimen.  Refill sent for the next 90 days.      Relevant Medications   lisdexamfetamine (VYVANSE) 70 MG capsule (Start on 12/04/2019)   lisdexamfetamine (VYVANSE) 70 MG capsule (Start on 11/05/2019)   lisdexamfetamine (VYVANSE) 70 MG capsule      Meds ordered this encounter  Medications  . omeprazole (PRILOSEC) 40 MG capsule    Sig: Take 1 capsule (40 mg total) by mouth daily.    Dispense:  90 capsule    Refill:  1  . lisdexamfetamine (VYVANSE) 70 MG capsule    Sig: Take 1 capsule (70 mg total) by mouth daily.     Dispense:  30 capsule    Refill:  0  . lisdexamfetamine (VYVANSE) 70 MG capsule    Sig: Take 1 capsule (70 mg total) by mouth daily.    Dispense:  30 capsule    Refill:  0  . lisdexamfetamine (VYVANSE) 70 MG capsule    Sig: Take 1 capsule (70 mg total) by mouth daily.    Dispense:  30 capsule    Refill:  0  . rOPINIRole (REQUIP) 0.25 MG tablet    Sig: Take 1-2 tablets (0.25-0.5 mg total) by mouth at bedtime.    Dispense:  90 tablet    Refill:  1    Follow-up: [facility] about 3 [facility] (around [facility]06/2020[facility]     I discussed the assessment and treatment plan with the patient. The patient was provided an opportunity to ask questions and all were answered. The patient agreed with the plan and demonstrated an understanding of the instructions.   The patient was advised to call back or seek an in-person evaluation if the symptoms worsen or if the condition fails to improve as anticipated.  Nani Gasseratherine Arwa Yero, MD

## 2019-10-06 NOTE — Assessment & Plan Note (Signed)
Her blood pressure looks fantastic.  Continue to monitor.

## 2019-10-06 NOTE — Progress Notes (Signed)
Pt reports that she has RLS. She stated that this has been going on x 6 mos.  Maryruth Eve, Lahoma Crocker, CMA

## 2019-10-20 ENCOUNTER — Ambulatory Visit (INDEPENDENT_AMBULATORY_CARE_PROVIDER_SITE_OTHER): Payer: BC Managed Care – PPO | Admitting: Family Medicine

## 2019-10-20 DIAGNOSIS — Z23 Encounter for immunization: Secondary | ICD-10-CM

## 2019-11-01 ENCOUNTER — Other Ambulatory Visit: Payer: Self-pay | Admitting: Family Medicine

## 2019-11-12 ENCOUNTER — Other Ambulatory Visit: Payer: Self-pay | Admitting: Family Medicine

## 2019-11-12 DIAGNOSIS — G2581 Restless legs syndrome: Secondary | ICD-10-CM

## 2019-12-30 ENCOUNTER — Encounter: Payer: Self-pay | Admitting: Family Medicine

## 2019-12-30 ENCOUNTER — Other Ambulatory Visit: Payer: Self-pay | Admitting: Family Medicine

## 2019-12-30 DIAGNOSIS — F902 Attention-deficit hyperactivity disorder, combined type: Secondary | ICD-10-CM

## 2019-12-31 MED ORDER — LISDEXAMFETAMINE DIMESYLATE 70 MG PO CAPS: 70.0000 mg | ORAL_CAPSULE | Freq: Every day | ORAL | 0 refills | Status: DC

## 2020-01-06 ENCOUNTER — Telehealth: Payer: BC Managed Care – PPO | Admitting: Family Medicine

## 2020-01-17 ENCOUNTER — Encounter: Payer: Self-pay | Admitting: Family Medicine

## 2020-01-17 ENCOUNTER — Telehealth (INDEPENDENT_AMBULATORY_CARE_PROVIDER_SITE_OTHER): Payer: BC Managed Care – PPO | Admitting: Family Medicine

## 2020-01-17 ENCOUNTER — Telehealth: Payer: BC Managed Care – PPO | Admitting: Family Medicine

## 2020-01-17 DIAGNOSIS — G2581 Restless legs syndrome: Secondary | ICD-10-CM

## 2020-01-17 DIAGNOSIS — I10 Essential (primary) hypertension: Secondary | ICD-10-CM | POA: Diagnosis not present

## 2020-01-17 DIAGNOSIS — F902 Attention-deficit hyperactivity disorder, combined type: Secondary | ICD-10-CM | POA: Diagnosis not present

## 2020-01-17 MED ORDER — LISDEXAMFETAMINE DIMESYLATE 70 MG PO CAPS: 70.0000 mg | ORAL_CAPSULE | Freq: Every day | ORAL | 0 refills | Status: DC

## 2020-01-17 MED ORDER — ROPINIROLE HCL 0.5 MG PO TABS: 0.5000 mg | ORAL_TABLET | Freq: Every day | ORAL | 0 refills | Status: DC

## 2020-01-17 NOTE — Progress Notes (Signed)
Virtual Visit via Video Note  I connected with Glenda Fernandez on 01/17/20 at  1:00 PM EST by a video enabled telemedicine application and verified that I am speaking with the correct person using two identifiers.   I discussed the limitations of evaluation and management by telemedicine and the availability of in person appointments. The patient expressed understanding and agreed to proceed.  Subjective:    CC: Follow-up ADHD and new start of ropinirole for restless leg syndrome.  HPI: ADHD - Reports symptoms are well controlled on current regime. Denies any problems with insomnia, chest pain, palpitations, or SOB.  She is having a little bit of hard time with motivation recently.  Hypertension- Pt denies chest pain, SOB, dizziness, or heart palpitations.  Taking meds as directed w/o problems.  Denies medication side effects.   F/U on RLS - last time I saw her I felt like her symptoms were RLS.  She is tolerating the medication well and is up to the 0.5 mg and that actually seems to be working great for her.  She has not noticed any side effects.  She says it does not 100% control the symptoms but overall she seems happy with the results.  Past medical history, Surgical history, Family history not pertinant except as noted below, Social history, Allergies, and medications have been entered into the medical record, reviewed, and corrections made.   Review of Systems: No fevers, chills, night sweats, weight loss, chest pain, or shortness of breath.   Objective:    General: Speaking clearly in complete sentences without any shortness of breath.  Alert and oriented x3.  Normal judgment. No apparent acute distress.    Impression and Recommendations:    Essential hypertension Well controlled. Continue current regimen. Follow up in  6 mo  ADHD (attention deficit hyperactivity disorder) Well controlled. Continue current regimen. Follow up in  4 months.   RLS (restless legs syndrome) Doing  well with new start of ropinirole.  We will go ahead and increase dose to 0.5 mg that she is currently taking 2 of the 0.25 mg dose.  New prescription sent to pharmacy.  If she is otherwise doing well then plan to follow-up in 3 to 4 months.   I discussed the assessment and treatment plan with the patient. The patient was provided an opportunity to ask questions and all were answered. The patient agreed with the plan and demonstrated an understanding of the instructions.   The patient was advised to call back or seek an in-person evaluation if the symptoms worsen or if the condition fails to improve as anticipated.   Glenda Gasser, MD

## 2020-01-17 NOTE — Assessment & Plan Note (Signed)
Doing well with new start of ropinirole.  We will go ahead and increase dose to 0.5 mg that she is currently taking 2 of the 0.25 mg dose.  New prescription sent to pharmacy.  If she is otherwise doing well then plan to follow-up in 3 to 4 months.

## 2020-01-17 NOTE — Progress Notes (Signed)
Patient needing Vyvanse refills.   Pended 3 RX please complete fill dates. Last RX was sent to pharmacy for pt on 12/31/19 for #30   No further needs from pt, PHQ/GAD completed

## 2020-01-17 NOTE — Assessment & Plan Note (Signed)
Well controlled. Continue current regimen. Follow up in  4 months.   

## 2020-01-17 NOTE — Assessment & Plan Note (Signed)
Well controlled. Continue current regimen. Follow up in  6 mo  

## 2020-01-25 ENCOUNTER — Other Ambulatory Visit: Payer: Self-pay | Admitting: Family Medicine

## 2020-01-25 DIAGNOSIS — I10 Essential (primary) hypertension: Secondary | ICD-10-CM

## 2020-02-08 ENCOUNTER — Other Ambulatory Visit: Payer: Self-pay | Admitting: Family Medicine

## 2020-02-08 DIAGNOSIS — G2581 Restless legs syndrome: Secondary | ICD-10-CM

## 2020-02-09 ENCOUNTER — Emergency Department (HOSPITAL_BASED_OUTPATIENT_CLINIC_OR_DEPARTMENT_OTHER)
Admission: EM | Admit: 2020-02-09 | Discharge: 2020-02-10 | Disposition: A | Discharge status: Home / Self Care | Payer: BC Managed Care – PPO | Attending: Emergency Medicine | Admitting: Emergency Medicine

## 2020-02-09 ENCOUNTER — Emergency Department (INDEPENDENT_AMBULATORY_CARE_PROVIDER_SITE_OTHER)
Admission: EM | Admit: 2020-02-09 | Discharge: 2020-02-09 | Disposition: A | Discharge status: Other Acute Inpatient Hospital | Payer: BC Managed Care – PPO | Source: Home / Self Care

## 2020-02-09 ENCOUNTER — Encounter: Payer: Self-pay | Admitting: Emergency Medicine

## 2020-02-09 ENCOUNTER — Encounter (HOSPITAL_BASED_OUTPATIENT_CLINIC_OR_DEPARTMENT_OTHER): Payer: Self-pay | Admitting: Emergency Medicine

## 2020-02-09 ENCOUNTER — Other Ambulatory Visit: Payer: Self-pay

## 2020-02-09 DIAGNOSIS — W01198A Fall on same level from slipping, tripping and stumbling with subsequent striking against other object, initial encounter: Secondary | ICD-10-CM | POA: Diagnosis not present

## 2020-02-09 DIAGNOSIS — Y999 Unspecified external cause status: Secondary | ICD-10-CM | POA: Insufficient documentation

## 2020-02-09 DIAGNOSIS — F17210 Nicotine dependence, cigarettes, uncomplicated: Secondary | ICD-10-CM | POA: Insufficient documentation

## 2020-02-09 DIAGNOSIS — I10 Essential (primary) hypertension: Secondary | ICD-10-CM | POA: Diagnosis not present

## 2020-02-09 DIAGNOSIS — S01119A Laceration without foreign body of unspecified eyelid and periocular area, initial encounter: Secondary | ICD-10-CM

## 2020-02-09 DIAGNOSIS — Y93K1 Activity, walking an animal: Secondary | ICD-10-CM | POA: Diagnosis not present

## 2020-02-09 DIAGNOSIS — S01111A Laceration without foreign body of right eyelid and periocular area, initial encounter: Secondary | ICD-10-CM | POA: Diagnosis not present

## 2020-02-09 DIAGNOSIS — Z79899 Other long term (current) drug therapy: Secondary | ICD-10-CM | POA: Diagnosis not present

## 2020-02-09 DIAGNOSIS — S0990XA Unspecified injury of head, initial encounter: Secondary | ICD-10-CM

## 2020-02-09 DIAGNOSIS — Y9289 Other specified places as the place of occurrence of the external cause: Secondary | ICD-10-CM | POA: Insufficient documentation

## 2020-02-09 DIAGNOSIS — S0181XA Laceration without foreign body of other part of head, initial encounter: Secondary | ICD-10-CM | POA: Insufficient documentation

## 2020-02-09 MED ORDER — ACETAMINOPHEN 325 MG PO TABS
650.0000 mg | ORAL_TABLET | Freq: Once | ORAL | Status: AC
  Administered 2020-02-09: 650 mg via ORAL
  Filled 2020-02-09: qty 2

## 2020-02-09 MED ORDER — LIDOCAINE-EPINEPHRINE 2 %-1:100000 IJ SOLN
10.0000 mL | Freq: Once | INTRAMUSCULAR | Status: DC
  Filled 2020-02-09: qty 10.2

## 2020-02-09 MED ORDER — LIDOCAINE-EPINEPHRINE (PF) 2 %-1:200000 IJ SOLN
INTRAMUSCULAR | Status: AC
  Administered 2020-02-09: 10 mL
  Filled 2020-02-09: qty 10

## 2020-02-09 NOTE — ED Triage Notes (Signed)
Tripped on steps at home 3 hours ago. Seen at urgent care and sent here. Laceration above left eyebrow and  Right elbow . No LOC. Amb. NAD

## 2020-02-09 NOTE — Discharge Instructions (Addendum)
Referred to emergency room for definitive care with possible head CT

## 2020-02-09 NOTE — ED Provider Notes (Signed)
[facility] EM[facility]GENCY DEPARTMENT Provider Note   CSN: [id number] Arrival date & time: 02/09/20  2010     History Chief Complaint  Patient presents with  . Fall  . Head Laceration    Glenda Fernandez is a 59 y.o. female with no significant PMH who presents to the ED after sustaining a mechanical fall at home.  Patient reports that she tripped over a pallet while walking her dog and struck her head on the corner of her stairs.  She has a deep laceration on her forehead, bleeding relatively well controlled.  She was evaluated in urgent care and was subsequently sent here for repair and possible head neck imaging.  Patient is denying any chest pain, neck pain, shortness of breath, nausea or vomiting, or any neurologic symptoms and is only asking for a Tylenol at this time.  She reports that her tetanus was updated in 2018.  She is not on any blood thinners.  She denies any neurologic symptoms, bad headache, nausea or vomiting, neck pain, or any other symptoms.  HPI     Past Medical History:  Diagnosis Date  . Arthritis    osteo  . Asthma   . Depression   . GERD (gastroesophageal reflux disease)   . Iron deficiency anemia 09/11/2012    Patient Active Problem List   Diagnosis Date Noted  . GERD (gastroesophageal reflux disease) 10/06/2019  . RLS (restless legs syndrome) 10/06/2019  . Tibial plateau fracture, left, closed, initial encounter 11/03/2018  . Acute kidney injury ([place]) 11/03/2018  . Mild intermittent asthma with acute exacerbation 02/16/2018  . Essential hypertension 05/20/2017  . Cord compression ([place]) 10/20/2015  . Right shoulder pain 10/20/2015  . 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 Iron deficiency anemia 09/11/2012  . Depression 09/11/2012  . Hyperlipidemia 09/10/2012  . ADHD (attention deficit hyperactivity disorder) 09/10/2012  . Tobacco abuse 09/10/2012  . INCONTINENCE, STRESS, FEMALE 09/23/2006  . INSOMNIA NOS 09/23/2006    Past  Surgical History:  Procedure Laterality Date  . BACK SURGERY  2001  . BLADDER SUSPENSION    . BREAST ENHANCEMENT SURGERY Left 1879  . CARPAL TUNNEL RELEASE Right 11/01/2016   Procedure: CARPAL TUNNEL RELEASE;  Surgeon: Dorna Leitz, MD;  Location: [facility];  Service: Orthopedics;  Laterality: Right;  . HAND SURGERY Left 2013   for fracture, [place] [facility]  . NECK SURGERY    . ORIF WRIST FRACTURE Right 11/01/2016   Procedure: OPEN REDUCTION INTERNAL FIXATION (ORIF) WRIST FRACTURE;  Surgeon: Dorna Leitz, MD;  Location: [facility];  Service: Orthopedics;  Laterality: Right;  . SHOULDER SURGERY  2005     OB History   No obstetric history on file.     Family History  Problem Relation Age of Onset  . Coronary artery disease Father   . Lung cancer Father        mesothelioma  . Hyperlipidemia Father   . Alcohol abuse Other        uncle  . Breast cancer Mother   . Kidney failure Mother   . Hyperlipidemia Mother   . Bipolar disorder Daughter   . Depression Daughter     Social History   Tobacco Use  . Smoking status: Current Every Day Smoker    Packs/day: 1.00    Years: 40.00    Pack years: 40.00  . Smokeless tobacco: Never Used  Substance Use Topics  . Alcohol use: No  . Drug use: Not on  file    Home Medications Prior to Admission medications   Medication Sig Start Date End Date Taking? Authorizing Provider  albuterol (PROVENTIL HFA;VENTOLIN HFA) 108 (90 Base) MCG/ACT inhaler Inhale 2 puffs into the lungs every 6 (six) hours as needed for wheezing or shortness of breath. 01/25/19   Rodolph Bong, MD  budesonide-formoterol [facility]) 160-4.5 MCG/ACT inhaler TAKE 2 PUFFS BY MOUTH TWICE A DAY 08/24/19   Agapito Games, MD  diclofenac sodium (VOLTAREN) 1 % GEL Apply 4 g topically 4 (four) times daily. To affected joint. 12/31/18   Rodolph Bong, MD  FLUoxetine (PROZAC) 40 MG capsule TAKE 2 CAPSULES (80 MG TOTAL) BY MOUTH DAILY. 01/25/20   Agapito Games, MD  hydrOXYzine (ATARAX/VISTARIL) 10 MG tablet TAKE 1-2 TABLETS (10-20 MG TOTAL) BY MOUTH EVERY 8 (EIGHT) HOURS AS NEEDED FOR ANXIETY. 11/02/19   Agapito Games, MD  ipratropium-albuterol (DUONEB) 0.5-2.5 (3) MG/3ML SOLN TAKE 3 MLS BY NEBULIZATION EVERY 4 (FOUR) HOURS AS NEEDED. 03/16/18   Agapito Games, MD  lisdexamfetamine (VYVANSE) 70 MG capsule Take 1 capsule (70 mg total) by mouth daily. 12/31/19   Agapito Games, MD  lisdexamfetamine (VYVANSE) 70 MG capsule Take 1 capsule (70 mg total) by mouth daily. 03/15/20   Agapito Games, MD  lisdexamfetamine (VYVANSE) 70 MG capsule Take 1 capsule (70 mg total) by mouth daily. 02/14/20   Agapito Games, MD  lisdexamfetamine (VYVANSE) 70 MG capsule Take 1 capsule (70 mg total) by mouth daily. 01/17/20   Agapito Games, MD  lisinopril-hydrochlorothiazide (ZESTORETIC) 20-25 MG tablet TAKE 1 TABLET BY MOUTH DAILY. 01/25/20   Agapito Games, MD  lovastatin (MEVACOR) 40 MG tablet TAKE 1 TABLET BY MOUTH EVERYDAY AT BEDTIME 04/30/19   Agapito Games, MD  omeprazole (PRILOSEC) 40 MG capsule Take 1 capsule (40 mg total) by mouth daily. 10/06/19   Agapito Games, MD  rOPINIRole (REQUIP) 0.5 MG tablet Take 1 tablet (0.5 mg total) by mouth at bedtime. 01/17/20   Agapito Games, MD    Allergies    Patient has no known allergies.  Review of Systems   Review of Systems  Gastrointestinal: Negative for nausea.  Skin: Positive for wound.  Neurological: Negative for dizziness, weakness, numbness and headaches.  Hematological: Does not bruise/bleed easily.    Physical Exam Updated Vital Signs BP 132/78 (BP Location: Left Arm)   Pulse 74   Temp 98.2 F (36.8 C) (Oral)   Resp 16   SpO2 98%   Physical Exam Vitals and nursing note reviewed. Exam conducted with a chaperone present.  Constitutional:      General: She is not in acute distress.    Appearance: Normal appearance.  HENT:      Head:     Comments: 6 cm linear, gaping wound immediately medial to left eyebrow and extending vertically up forehead.  Bleeding relatively well controlled.  Bruising noted, predominantly laterally.  No palpable skull defects.  No hemotympanum, raccoon eyes, or battle sign. Eyes:     General: No scleral icterus.    Conjunctiva/sclera: Conjunctivae normal.  Neck:     Comments: No midline cervical TTP.  ROM fully intact. Cardiovascular:     Rate and Rhythm: Normal rate and regular rhythm.     Pulses: Normal pulses.     Heart sounds: Normal heart sounds.  Pulmonary:     Effort: Pulmonary effort is normal.  Musculoskeletal:        General: Normal range  of motion.     Cervical back: Normal range of motion and neck supple. No rigidity.  Skin:    General: Skin is dry.     Capillary Refill: Capillary refill takes less than 2 seconds.  Neurological:     General: No focal deficit present.     Mental Status: She is alert and oriented to person, place, and time.     GCS: GCS eye subscore is 4. GCS verbal subscore is 5. GCS motor subscore is 6.     Cranial Nerves: No cranial nerve deficit.     Sensory: No sensory deficit.     Motor: No weakness.     Coordination: Coordination normal.     Gait: Gait normal.  Psychiatric:        Mood and Affect: Mood normal.        Behavior: Behavior normal.        Thought Content: Thought content normal.            ED Results / Procedures / Treatments   Labs (all labs ordered are listed, but only abnormal results are displayed) Labs Reviewed - No data to display  EKG None  Radiology No results found.  Procedures .Marland KitchenLaceration Repair  Date/Time: 02/09/2020 11:38 PM Performed by: Lorelee New, PA-C Authorized by: Lorelee New, PA-C   Consent:    Consent obtained:  Verbal   Consent given by:  Patient   Risks discussed:  Infection, need for additional repair, pain, poor cosmetic result, poor wound healing, retained foreign body,  tendon damage, vascular damage and nerve damage   Alternatives discussed:  No treatment and delayed treatment Universal protocol:    Procedure explained and questions answered to patient or proxy's satisfaction: yes     Relevant documents present and verified: yes     Test results available and properly labeled: yes     Imaging studies available: yes     Required blood products, implants, devices, and special equipment available: yes     Site/side marked: yes     Immediately prior to procedure, a time out was called: yes     Patient identity confirmed:  Verbally with patient Anesthesia (see MAR for exact dosages):    Anesthesia method:  Local infiltration   Local anesthetic:  Lidocaine 2% WITH epi Laceration details:    Location:  Face   Face location:  Forehead   Length (cm):  6 Repair type:    Repair type:  Intermediate Pre-procedure details:    Preparation:  Patient was prepped and draped in usual sterile fashion Exploration:    Hemostasis achieved with:  Direct pressure   Wound exploration: wound explored through full range of motion     Contaminated: no   Treatment:    Area cleansed with:  Saline   Amount of cleaning:  Extensive   Irrigation solution:  Sterile saline   Irrigation volume:  500 mL   Irrigation method:  Pressure wash   Visualized foreign bodies/material removed: no   Skin repair:    Repair method:  Sutures   Suture size:  6-0   Suture material:  Prolene   Suture technique:  Simple interrupted   Number of sutures:  7 Post-procedure details:    Dressing:  Sterile dressing   Patient tolerance of procedure:  Tolerated well, no immediate complications   (including critical care time)  Medications Ordered in ED Medications  lidocaine-EPINEPHrine (XYLOCAINE W/EPI) 2 %-1:100000 (with pres) injection 10 mL (has no administration in  time range)  acetaminophen (TYLENOL) tablet 650 mg (650 mg Oral Given 02/09/20 2224)  lidocaine-EPINEPHrine (XYLOCAINE W/EPI) 2  %-1:200000 (PF) injection (10 mLs  Given 02/09/20 2225)    ED Course  I have reviewed the triage vital signs and the nursing notes.  Pertinent labs & imaging results that were available during my care of the patient were reviewed by me and considered in my medical decision making (see chart for details).    MDM Rules/Calculators/A&P                      Before and after the procedure the motor function, strength, sensation, and circulatory function was assessed.    Patient tolerated procedure well.  Instructed to take the sutures out in 5 days.  Recommending topical antibiotic ointment or similar cream for improved cosmetic outcome.  Patient is denying any significant headache and after 60 mg Tylenol she states that it is entirely resolved.  She has no neurologic findings on exam.  No nausea or vomiting.  While it was a significant laceration, I have low suspicion for any intracranial abnormalities.  Do not feel as though head CT is warranted.  She has no midline TTP over cervical spine and I have low suspicion for neck fracture.  She demonstrated full ROM and has no complaints at this time.  After laceration repair, she is very satisfied with the outcome.  Plan for patient follow-up with her primary care provider regarding today's encounter and for suture removal.  Very strict ED return precautions discussed with the patient.  She voiced understanding is agreeable to plan.  Recommended over-the-counter Tylenol as needed for pain.  Final Clinical Impression(s) / ED Diagnoses Final diagnoses:  Laceration of periocular area without foreign body, unspecified laterality, initial encounter    Rx / DC Orders ED Discharge Orders    None       Lorelee New, PA-C 02/09/20 2341    Gwyneth Sprout, MD 02/12/20 1515

## 2020-02-09 NOTE — ED Triage Notes (Signed)
Patient tripped on cement step about one hour ago and fell into corner of step causing laceration of forehead and abrasion on right elbow. Influenza vacc this season. Tdap 2018. No known exposure to covid positive person.

## 2020-02-09 NOTE — Discharge Instructions (Signed)
You will need to have these sutures removed in 5 days by your primary care provider or another provider.  Please apply topical antibiotic ointment regularly for cosmetic outcome.  Please read the attachments on facial lacerations and laceration care.  Return to the ED or seek immediate medical attention should you develop any worsening headache, dizziness, neurologic symptoms, or any other new or worsening symptoms.

## 2020-02-09 NOTE — ED Provider Notes (Signed)
[facility]    CSN: [id number] Arrival date & time: 02/09/20  0938      History   Chief Complaint Chief Complaint  Patient presents with  . Facial Laceration  . Elbow Injury    HPI Glenda Fernandez is a 59 y.o. female.   Patient suffered a fall and has a deep laceration on her forehead between the eyebrows.  Laceration appears down to skull.  Given severity of laceration I think she probably needs further evaluation including head CT possible C-spine.  HPI  Past Medical History:  Diagnosis Date  . Arthritis    osteo  . Asthma   . Depression   . GERD (gastroesophageal reflux disease)   . Iron deficiency anemia 09/11/2012    Patient Active Problem List   Diagnosis Date Noted  . GERD (gastroesophageal reflux disease) 10/06/2019  . RLS (restless legs syndrome) 10/06/2019  . Tibial plateau fracture, left, closed, initial encounter 11/03/2018  . Acute kidney injury ([place]) 11/03/2018  . Mild intermittent asthma with acute exacerbation 02/16/2018  . Essential hypertension 05/20/2017  . Cord compression ([place]) 10/20/2015  . Right shoulder pain 10/20/2015  . 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 Iron deficiency anemia 09/11/2012  . Depression 09/11/2012  . Hyperlipidemia 09/10/2012  . ADHD (attention deficit hyperactivity disorder) 09/10/2012  . Tobacco abuse 09/10/2012  . INCONTINENCE, STRESS, FEMALE 09/23/2006  . INSOMNIA NOS 09/23/2006    Past Surgical History:  Procedure Laterality Date  . BACK SURGERY  2001  . BLADDER SUSPENSION    . BREAST ENHANCEMENT SURGERY Left 1879  . CARPAL TUNNEL RELEASE Right 11/01/2016   Procedure: CARPAL TUNNEL RELEASE;  Surgeon: Dorna Leitz, MD;  Location: [facility];  Service: Orthopedics;  Laterality: Right;  . HAND SURGERY Left 2013   for fracture, [place] [facility]  . NECK SURGERY    . ORIF WRIST FRACTURE Right 11/01/2016   Procedure: OPEN REDUCTION INTERNAL FIXATION (ORIF) WRIST FRACTURE;  Surgeon: Dorna Leitz, MD;  Location: [place] [facility];  Service: Orthopedics;  Laterality: Right;  . SHOULDER SURGERY  2005    OB History   No obstetric history on file.      Home Medications    Prior to Admission medications   Medication Sig Start Date End Date Taking? Authorizing Provider  albuterol (PROVENTIL HFA;VENTOLIN HFA) 108 (90 Base) MCG/ACT inhaler Inhale 2 puffs into the lungs every 6 (six) hours as needed for wheezing or shortness of breath. 01/25/19   Gregor Hams, MD  budesonide-formoterol [facility]) 160-4.5 MCG/ACT inhaler TAKE 2 PUFFS BY MOUTH TWICE A DAY 08/24/19   Hali Marry, MD  diclofenac sodium (VOLTAREN) 1 % GEL Apply 4 g topically 4 (four) times daily. To affected joint. 12/31/18   Gregor Hams, MD  FLUoxetine (PROZAC) 40 MG capsule TAKE 2 CAPSULES (80 MG TOTAL) BY MOUTH DAILY. 01/25/20   Hali Marry, MD  hydrOXYzine (ATARAX/VISTARIL) 10 MG tablet TAKE 1-2 TABLETS (10-20 MG TOTAL) BY MOUTH EVERY 8 (EIGHT) HOURS AS NEEDED FOR ANXIETY. 11/02/19   Hali Marry, MD  ipratropium-albuterol (DUONEB) 0.5-2.5 (3) MG/3ML SOLN TAKE 3 MLS BY NEBULIZATION EVERY 4 (FOUR) HOURS AS NEEDED. 03/16/18   Hali Marry, MD  lisdexamfetamine (VYVANSE) 70 MG capsule Take 1 capsule (70 mg total) by mouth daily. 12/31/19   Hali Marry, MD  lisdexamfetamine (VYVANSE) 70 MG capsule Take 1 capsule (70 mg total) by mouth daily. 03/15/20   Hali Marry, MD  lisdexamfetamine (VYVANSE) 70 MG capsule Take 1 capsule (70 mg total) by mouth daily. 02/14/20   Agapito Games, MD  lisdexamfetamine (VYVANSE) 70 MG capsule Take 1 capsule (70 mg total) by mouth daily. 01/17/20   Agapito Games, MD  lisinopril-hydrochlorothiazide (ZESTORETIC) 20-25 MG tablet TAKE 1 TABLET BY MOUTH DAILY. 01/25/20   Agapito Games, MD  lovastatin (MEVACOR) 40 MG tablet TAKE 1 TABLET BY MOUTH EVERYDAY AT BEDTIME 04/30/19   Agapito Games, MD  omeprazole  (PRILOSEC) 40 MG capsule Take 1 capsule (40 mg total) by mouth daily. 10/06/19   Agapito Games, MD  rOPINIRole (REQUIP) 0.5 MG tablet Take 1 tablet (0.5 mg total) by mouth at bedtime. 01/17/20   Agapito Games, MD    Family History Family History  Problem Relation Age of Onset  . Coronary artery disease Father   . Lung cancer Father        mesothelioma  . Hyperlipidemia Father   . Alcohol abuse Other        uncle  . Breast cancer Mother   . Kidney failure Mother   . Hyperlipidemia Mother   . Bipolar disorder Daughter   . Depression Daughter     Social History Social History   Tobacco Use  . Smoking status: Current Every Day Smoker    Packs/day: 1.00    Years: 40.00    Pack years: 40.00  . Smokeless tobacco: Never Used  Substance Use Topics  . Alcohol use: No  . Drug use: Not on file     Allergies   Patient has no known allergies.   Review of Systems Review of Systems  Skin: Positive for wound.  Neurological: Negative for headaches.  All other systems reviewed and are negative.    Physical Exam Triage Vital Signs ED Triage Vitals  Enc Vitals Group     BP 02/09/20 1929 119/71     Pulse Rate 02/09/20 1929 71     Resp 02/09/20 1929 18     Temp 02/09/20 1929 97.6 F (36.4 C)     Temp Source 02/09/20 1929 Oral     SpO2 02/09/20 1929 99 %     Weight 02/09/20 1931 145 lb (65.8 kg)     Height 02/09/20 1931 5' 7.5" (1.715 m)     Head Circumference --      Peak Flow --      Pain Score 02/09/20 1931 7     Pain Loc --      Pain Edu? --      Excl. in GC? --    No data found.  Updated Vital Signs BP 119/71 (BP Location: Left Arm)   Pulse 71   Temp 97.6 F (36.4 C) (Oral)   Resp 18   Ht 5' 7.5" (1.715 m)   Wt 65.8 kg   SpO2 99%   BMI 22.38 kg/m   Visual Acuity Right Eye Distance:   Left Eye Distance:   Bilateral Distance:    Right Eye Near:   Left Eye Near:    Bilateral Near:     Physical Exam Vitals and nursing note reviewed.    Constitutional:      Appearance: Normal appearance.  HENT:     Head:     Comments: Deep laceration center of forehead between brows. Eyes:     Pupils: Pupils are equal, round, and reactive to light.  Neurological:     General: No focal deficit present.     Mental  Status: She is alert and oriented to person, place, and time.      UC Treatments / Results  Labs (all labs ordered are listed, but only abnormal results are displayed) Labs Reviewed - No data to display  EKG   Radiology No results found.  Procedures Procedures (including critical care time)  Medications Ordered in UC Medications - No data to display  Initial Impression / Assessment and Plan / UC Course  I have reviewed the triage vital signs and the nursing notes.  Pertinent labs & imaging results that were available during my care of the patient were reviewed by me and considered in my medical decision making (see chart for details).     Forehead laceration.  Will refer to emergency room for definitive care.  Wound was dressed prior to discharge Final Clinical Impressions(s) / UC Diagnoses   Final diagnoses:  None   Discharge Instructions   None    ED Prescriptions    None     PDMP not reviewed this encounter.   Frederica Kuster, MD 02/09/20 1949

## 2020-02-13 ENCOUNTER — Other Ambulatory Visit: Payer: Self-pay | Admitting: Family Medicine

## 2020-02-14 ENCOUNTER — Encounter: Payer: Self-pay | Admitting: Nurse Practitioner

## 2020-02-14 ENCOUNTER — Ambulatory Visit: Payer: BC Managed Care – PPO | Admitting: Nurse Practitioner

## 2020-02-14 ENCOUNTER — Other Ambulatory Visit: Payer: Self-pay

## 2020-02-14 VITALS — BP 123/73 | HR 88 | Temp 98.2°F | Ht 67.5 in | Wt 157.1 lb

## 2020-02-14 DIAGNOSIS — R296 Repeated falls: Secondary | ICD-10-CM | POA: Diagnosis not present

## 2020-02-14 DIAGNOSIS — Z4802 Encounter for removal of sutures: Secondary | ICD-10-CM | POA: Diagnosis not present

## 2020-02-14 NOTE — Patient Instructions (Signed)
Suture Removal, Care After This sheet gives you information about how to care for yourself after your procedure. Your health care provider may also give you more specific instructions. If you have problems or questions, contact your health care provider. What can I expect after the procedure? After your stitches (sutures) are removed, it is common to have:  Some discomfort and swelling in the area.  Slight redness in the area. Follow these instructions at home: If you have a bandage:  Wash your hands with soap and water before you change your bandage (dressing). If soap and water are not available, use hand sanitizer.  Change your dressing as told by your health care provider. If your dressing becomes wet or dirty, or develops a bad smell, change it as soon as possible.  If your dressing sticks to your skin, soak it in warm water to loosen it. Wound care   Check your wound every day for signs of infection. Check for: ? More redness, swelling, or pain. ? Fluid or blood. ? Warmth. ? Pus or a bad smell.  Wash your hands with soap and water before and after touching your wound.  Apply cream or ointment only as directed by your health care provider. If you are using cream or ointment, wash the area with soap and water 2 times a day to remove all the cream or ointment. Rinse off the soap and pat the area dry with a clean towel.  If you have skin glue or adhesive strips on your wound, leave these closures in place. They may need to stay in place for 2 weeks or longer. If adhesive strip edges start to loosen and curl up, you may trim the loose edges. Do not remove adhesive strips completely unless your health care provider tells you to do that.  Keep the wound area dry and clean. Do not take baths, swim, or use a hot tub until your health care provider approves.  Continue to protect the wound from injury.  Do not pick at your wound. Picking can cause an infection.  When your wound has  completely healed, wear sunscreen over it or cover it with clothing when you are outside. New scars get sunburned easily, which can make scarring worse. General instructions  Take over-the-counter and prescription medicines only as told by your health care provider.  Keep all follow-up visits as told by your health care provider. This is important. Contact a health care provider if:  You have redness, swelling, or pain around your wound.  You have fluid or blood coming from your wound.  Your wound feels warm to the touch.  You have pus or a bad smell coming from your wound.  Your wound opens up. Get help right away if:  You have a fever.  You have redness that is spreading from your wound. Summary  After your sutures are removed, it is common to have some discomfort and swelling in the area.  Wash your hands with soap and water before you change your bandage (dressing).  Keep the wound area dry and clean. Do not take baths, swim, or use a hot tub until your health care provider approves. This information is not intended to replace advice given to you by your health care provider. Make sure you discuss any questions you have with your health care provider. Document Revised: 11/14/2017 Document Reviewed: 01/07/2017 Elsevier Patient Education  2020 Elsevier Inc.  

## 2020-02-14 NOTE — Progress Notes (Signed)
Acute Office Visit  Subjective:    Patient ID: Glenda Fernandez, female    DOB: 1961-05-10, 59 y.o.   MRN: [id number]  Chief Complaint  Patient presents with  . Suture / Staple Removal    HPI Patient is in today for removal of sutures over her left eye.  The original injury occurred as a result of a fall while walking into her house where she stepped on a pallet, lost her balance, and feel hitting her head onto the bottom step and possibly the pallet.   FALLS [name] reports a history of multiple falls (at least 6-8 over the last six months). She states that she feels as though her "feet don't move as fast as her brain".  She denies tinnitus, dizziness, vertigo, vision changes, decreased vision, headaches, weakness or numbness in her extremities. 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   Past Medical History:  Diagnosis Date  . Arthritis    osteo  . Asthma   . Depression   . GERD (gastroesophageal reflux disease)   . Iron deficiency anemia 09/11/2012    Past Surgical History:  Procedure Laterality Date  . BACK SURGERY  2001  . BLADDER SUSPENSION    . BREAST ENHANCEMENT SURGERY Left 1879  . CARPAL TUNNEL RELEASE Right 11/01/2016   Procedure: CARPAL TUNNEL RELEASE;  Surgeon: Glenda Geralds, MD;  Location: [facility];  Service: Orthopedics;  Laterality: Right;  . HAND SURGERY Left 2013   for fracture, [place] [facility]  . NECK SURGERY    . ORIF WRIST FRACTURE Right 11/01/2016   Procedure: OPEN REDUCTION INTERNAL FIXATION (ORIF) WRIST FRACTURE;  Surgeon: Glenda Geralds, MD;  Location: [facility];  Service: Orthopedics;  Laterality: Right;  . SHOULDER SURGERY  2005    Family History  Problem Relation Age of Onset  . Coronary artery disease Father   . Lung cancer Father        mesothelioma  . Hyperlipidemia Father   . Alcohol abuse Other        uncle  . Breast  cancer Mother   . Kidney failure Mother   . Hyperlipidemia Mother   . Bipolar disorder Daughter   . Depression Daughter     Social History   Socioeconomic History  . Marital status: Married    Spouse name: Glenda Fernandez  . Number of children: 2  . Years of education: Not on file  . Highest education level: Not on file  Occupational History  . Occupation: Evidence Tech    Comment: [employer]  Tobacco Use  . Smoking status: Current Every Day Smoker    Packs/day: 1.00    Years: 40.00    Pack years: 40.00  . Smokeless tobacco: Never Used  Substance and Sexual Activity  . Alcohol use: No  . Drug use: Not on file  . Sexual activity: Not on file  Other Topics Concern  . Not on file  Social History Narrative   Some college.  3 caffeine per day.  Walks 3 times per week for exercise.    Social Determinants of Health   Financial Resource Strain:   . Difficulty of Paying Living Expenses: Not on file  Food Insecurity:   . Worried About Programme researcher, broadcasting/film/video in the Last Year: Not on file  . Ran Out of Food in the Last Year: Not on file  Transportation Needs:   . Film/video editor (Medical): Not on file  . Lack of Transportation (Non-Medical): Not on file  Physical Activity:   . Days of Exercise per Week: Not on file  . Minutes of Exercise per Session: Not on file  Stress:   . Feeling of Stress : Not on file  Social Connections:   . Frequency of Communication with Friends and Family: Not on file  . Frequency of Social Gatherings with Friends and Family: Not on file  . Attends Religious Services: Not on file  . Active Member of Clubs or Organizations: Not on file  . Attends Archivist Meetings: Not on file  . Marital Status: Not on file  Intimate Partner Violence:   . Fear of Current or Ex-Partner: Not on file  . Emotionally Abused: Not on file  . Physically Abused: Not on file  . Sexually Abused: Not on file    Outpatient Medications Prior to Visit    Medication Sig Dispense Refill  . albuterol (PROVENTIL HFA;VENTOLIN HFA) 108 (90 Base) MCG/ACT inhaler Inhale 2 puffs into the lungs every 6 (six) hours as needed for wheezing or shortness of breath. 8.5 Inhaler 1  . budesonide-formoterol (SYMBICORT) 160-4.5 MCG/ACT inhaler TAKE 2 PUFFS BY MOUTH TWICE A DAY 30.6 Inhaler 1  . diclofenac sodium (VOLTAREN) 1 % GEL Apply 4 g topically 4 (four) times daily. To affected joint. 100 g 11  . FLUoxetine (PROZAC) 40 MG capsule TAKE 2 CAPSULES (80 MG TOTAL) BY MOUTH DAILY. 180 capsule 1  . hydrOXYzine (ATARAX/VISTARIL) 10 MG tablet TAKE 1-2 TABLETS (10-20 MG TOTAL) BY MOUTH EVERY 8 (EIGHT) HOURS AS NEEDED FOR ANXIETY. 45 tablet 3  . ipratropium-albuterol (DUONEB) 0.5-2.5 (3) MG/3ML SOLN TAKE 3 MLS BY NEBULIZATION EVERY 4 (FOUR) HOURS AS NEEDED. 360 mL 0  . lisdexamfetamine (VYVANSE) 70 MG capsule Take 1 capsule (70 mg total) by mouth daily. 30 capsule 0  . [START ON 03/15/2020] lisdexamfetamine (VYVANSE) 70 MG capsule Take 1 capsule (70 mg total) by mouth daily. 30 capsule 0  . lisdexamfetamine (VYVANSE) 70 MG capsule Take 1 capsule (70 mg total) by mouth daily. 30 capsule 0  . lisdexamfetamine (VYVANSE) 70 MG capsule Take 1 capsule (70 mg total) by mouth daily. 30 capsule 0  . lisinopril-hydrochlorothiazide (ZESTORETIC) 20-25 MG tablet TAKE 1 TABLET BY MOUTH DAILY. 90 tablet 1  . lovastatin (MEVACOR) 40 MG tablet TAKE 1 TABLET BY MOUTH EVERYDAY AT BEDTIME 90 tablet 3  . omeprazole (PRILOSEC) 40 MG capsule Take 1 capsule (40 mg total) by mouth daily. 90 capsule 1  . rOPINIRole (REQUIP) 0.5 MG tablet Take 1 tablet (0.5 mg total) by mouth at bedtime. 90 tablet 0   No facility-administered medications prior to visit.    No Known Allergies  Review of Systems  Constitutional: Negative for activity change, appetite change, chills, fatigue and fever.  HENT: Negative for congestion, ear discharge, ear pain, facial swelling, hearing loss, rhinorrhea and sinus  pressure.   Respiratory: Negative for cough, chest tightness and shortness of breath.   Cardiovascular: Negative for chest pain, palpitations and leg swelling.  Genitourinary: Negative for decreased urine volume, dysuria, flank pain, hematuria and urgency.  Musculoskeletal: Negative for arthralgias, back pain and gait problem.  Neurological: Negative for dizziness, tremors, syncope, weakness, light-headedness, numbness and headaches.  Psychiatric/Behavioral: Negative for confusion and decreased concentration. The patient is not nervous/anxious.        Objective:    Physical Exam Vitals and nursing note  reviewed.  Constitutional:      Appearance: Normal appearance.  HENT:     Nose: Nose normal.  Eyes:     General: No visual field deficit.    Extraocular Movements: Extraocular movements intact.     Pupils: Pupils are equal, round, and reactive to light.  Cardiovascular:     Rate and Rhythm: Normal rate and regular rhythm.     Pulses: Normal pulses.     Heart sounds: Normal heart sounds.  Pulmonary:     Effort: Pulmonary effort is normal.     Breath sounds: Normal breath sounds.  Abdominal:     General: Abdomen is flat. Bowel sounds are normal. There is no distension.     Palpations: Abdomen is soft.     Tenderness: There is no abdominal tenderness. There is no right CVA tenderness or left CVA tenderness.  Musculoskeletal:        General: Normal range of motion.     Cervical back: Normal range of motion. No rigidity.     Right lower leg: No edema.     Left lower leg: No edema.     Comments: Bilateral strength equal in upper and lower extremities.   Skin:    General: Skin is warm and dry.     Capillary Refill: Capillary refill takes less than 2 seconds.     Findings: Abrasion, ecchymosis and laceration present.       Neurological:     General: No focal deficit present.     Mental Status: She is alert and oriented to person, place, and time.     Cranial Nerves: No cranial  nerve deficit, dysarthria or facial asymmetry.     Sensory: Sensation is intact.     Motor: No weakness.     Coordination: Coordination is intact.     Gait: Gait is intact.     Deep Tendon Reflexes: Reflexes normal.  Psychiatric:        Mood and Affect: Mood normal.        Behavior: Behavior normal.        Thought Content: Thought content normal.        Judgment: Judgment normal.     BP 123/73   Pulse 88   Temp 98.2 F (36.8 C) (Oral)   Ht 5' 7.5" (1.715 m)   Wt 157 lb 1.6 oz (71.3 kg)   SpO2 96%   BMI 24.24 kg/m  Wt Readings from Last 3 Encounters:  02/14/20 157 lb 1.6 oz (71.3 kg)  02/09/20 145 lb (65.8 kg)  01/17/20 143 lb (64.9 kg)    Health Maintenance Due  Topic Date Due  . COLONOSCOPY  05/22/2009  . PAP SMEAR-Modifier  07/14/2018    There are no preventive care reminders to display for this patient.   Lab Results  Component Value Date   TSH 0.55 10/29/2018   Lab Results  Component Value Date   WBC 7.5 10/05/2019   HGB 12.0 10/05/2019   HCT 35.7 10/05/2019   MCV 91.1 10/05/2019   PLT 268 10/05/2019   Lab Results  Component Value Date   NA 139 10/05/2019   K 4.3 10/05/2019   CO2 29 10/05/2019   GLUCOSE 93 10/05/2019   BUN 17 10/05/2019   CREATININE 1.03 (H) 10/05/2019   BILITOT 0.3 10/05/2019   ALKPHOS 52 05/20/2017   AST 19 10/05/2019   ALT 16 10/05/2019   PROT 6.6 10/05/2019   ALBUMIN 3.8 05/20/2017   CALCIUM 9.2 10/05/2019  Lab Results  Component Value Date   CHOL 214 (H) 05/20/2017   Lab Results  Component Value Date   HDL 44 (L) 05/20/2017   Lab Results  Component Value Date   LDLCALC 173 (H) 09/05/2016   Lab Results  Component Value Date   TRIG 177 (H) 05/20/2017   Lab Results  Component Value Date   CHOLHDL 4.9 05/20/2017   No results found for: HGBA1C     Assessment & Plan:  1. Encounter for removal of sutures Sutures removed from left frontal region, right above the eye brow. Skin clean, dry, intact with no  signs of infection or opening. Steri strips placed over wound once sutures removed completely and patient instructed to trim strips daily until they fall off.  After care instructions provided.   2. Frequent falls Patient reports and increase in falls over the past six months. No obvious concerns with reflexes, coordination, or extra occular movements.  Discussed the option to refer to neurology for further evaluation of this issue, which patient was agreeable.  A review of the EMR revealed cord compression in 2019, but no documentation is present if any procedures were performed.   Return if symptoms worsen or fail to improve.    Tollie Eth, NP

## 2020-02-18 ENCOUNTER — Ambulatory Visit (INDEPENDENT_AMBULATORY_CARE_PROVIDER_SITE_OTHER): Payer: BC Managed Care – PPO

## 2020-02-18 ENCOUNTER — Other Ambulatory Visit: Payer: Self-pay

## 2020-02-18 ENCOUNTER — Encounter: Payer: Self-pay | Admitting: Family Medicine

## 2020-02-18 ENCOUNTER — Ambulatory Visit: Payer: BC Managed Care – PPO | Admitting: Family Medicine

## 2020-02-18 VITALS — BP 133/60 | HR 83 | Ht 68.0 in | Wt 156.0 lb

## 2020-02-18 DIAGNOSIS — R0789 Other chest pain: Secondary | ICD-10-CM

## 2020-02-18 DIAGNOSIS — M542 Cervicalgia: Secondary | ICD-10-CM

## 2020-02-18 DIAGNOSIS — M79671 Pain in right foot: Secondary | ICD-10-CM

## 2020-02-18 DIAGNOSIS — S299XXA Unspecified injury of thorax, initial encounter: Secondary | ICD-10-CM | POA: Diagnosis not present

## 2020-02-18 DIAGNOSIS — L03115 Cellulitis of right lower limb: Secondary | ICD-10-CM

## 2020-02-18 DIAGNOSIS — M7989 Other specified soft tissue disorders: Secondary | ICD-10-CM | POA: Diagnosis not present

## 2020-02-18 DIAGNOSIS — M791 Myalgia, unspecified site: Secondary | ICD-10-CM | POA: Insufficient documentation

## 2020-02-18 MED ORDER — DOXYCYCLINE HYCLATE 100 MG PO TABS: 100.0000 mg | ORAL_TABLET | Freq: Two times a day (BID) | ORAL | 0 refills | Status: DC

## 2020-02-18 NOTE — Patient Instructions (Signed)

## 2020-02-18 NOTE — Progress Notes (Signed)
Established Patient Office Visit  Subjective:  Patient ID: Glenda Fernandez, female    DOB: 10-Oct-1961  Age: 59 y.o. MRN: [id number]  CC:  Chief Complaint  Patient presents with  . Fall    1 week ago no LOC she did hit the L side of head. she reports     HPI Glenda Fernandez presents for Neck Pain s/p fall. The original injury occurred as a result of a fall while walking into her house where she stepped on a pallet, lost her balance, and feel hitting her head onto the bottom step and possibly the pallet.   Has a cut on her right foot. Using Hibiclens and neosporin on it.  It is swelling and has been having pain on the distal foot. But also has some swelling on the left foot.  He noticed some drainage from the area yesterday.  She has some sternum pain and neck pain. Again didn't notice it until a few days after her fall.  Hard to take a deep breath.  Says didn't notice it until about 3-4 days after her fall.     Frequent falls - has already been referred.  Neurology. They did try to call her yesterday.    Past Medical History:  Diagnosis Date  . Arthritis    osteo  . Asthma   . Depression   . GERD (gastroesophageal reflux disease)   . Iron deficiency anemia 09/11/2012    Past Surgical History:  Procedure Laterality Date  . BACK SURGERY  2001  . BLADDER SUSPENSION    . BREAST ENHANCEMENT SURGERY Left 1879  . CARPAL TUNNEL RELEASE Right 11/01/2016   Procedure: CARPAL TUNNEL RELEASE;  Surgeon: Jodi Geralds, MD;  Location: [facility];  Service: Orthopedics;  Laterality: Right;  . HAND SURGERY Left 2013   for fracture, [place] [facility]  . NECK SURGERY    . ORIF WRIST FRACTURE Right 11/01/2016   Procedure: OPEN REDUCTION INTERNAL FIXATION (ORIF) WRIST FRACTURE;  Surgeon: Jodi Geralds, MD;  Location: [facility];  Service: Orthopedics;  Laterality: Right;  . SHOULDER SURGERY  2005    Family History  Problem Relation Age of Onset  . Coronary artery disease Father    . Lung cancer Father        mesothelioma  . Hyperlipidemia Father   . Alcohol abuse Other        uncle  . Breast cancer Mother   . Kidney failure Mother   . Hyperlipidemia Mother   . Bipolar disorder Daughter   . Depression Daughter     Social History   Socioeconomic History  . Marital status: Married    Spouse name: Molly Maduro  . Number of children: 2  . Years of education: Not on file  . Highest education level: Not on file  Occupational History  . Occupation: Evidence Tech    Comment: [employer]  Tobacco Use  . Smoking status: Current Every Day Smoker    Packs/day: 1.00    Years: 40.00    Pack years: 40.00  . Smokeless tobacco: Never Used  Substance and Sexual Activity  . Alcohol use: No  . Drug use: Not on file  . Sexual activity: Not on file  Other Topics Concern  . Not on file  Social History Narrative   Some college.  3 caffeine per day.  Walks 3 times per week for exercise.    Social Determinants of Health   Financial Resource Strain:   .  Difficulty of Paying Living Expenses: Not on file  Food Insecurity:   . Worried About Programme researcher, broadcasting/film/video in the Last Year: Not on file  . Ran Out of Food in the Last Year: Not on file  Transportation Needs:   . Lack of Transportation (Medical): Not on file  . Lack of Transportation (Non-Medical): Not on file  Physical Activity:   . Days of Exercise per Week: Not on file  . Minutes of Exercise per Session: Not on file  Stress:   . Feeling of Stress : Not on file  Social Connections:   . Frequency of Communication with Friends and Family: Not on file  . Frequency of Social Gatherings with Friends and Family: Not on file  . Attends Religious Services: Not on file  . Active Member of Clubs or Organizations: Not on file  . Attends Banker Meetings: Not on file  . Marital Status: Not on file  Intimate Partner Violence:   . Fear of Current or Ex-Partner: Not on file  . Emotionally Abused: Not on file   . Physically Abused: Not on file  . Sexually Abused: Not on file    Outpatient Medications Prior to Visit  Medication Sig Dispense Refill  . albuterol (PROVENTIL HFA;VENTOLIN HFA) 108 (90 Base) MCG/ACT inhaler Inhale 2 puffs into the lungs every 6 (six) hours as needed for wheezing or shortness of breath. 8.5 Inhaler 1  . budesonide-formoterol (SYMBICORT) 160-4.5 MCG/ACT inhaler TAKE 2 PUFFS BY MOUTH TWICE A DAY 30.6 Inhaler 1  . diclofenac sodium (VOLTAREN) 1 % GEL Apply 4 g topically 4 (four) times daily. To affected joint. 100 g 11  . FLUoxetine (PROZAC) 40 MG capsule TAKE 2 CAPSULES (80 MG TOTAL) BY MOUTH DAILY. 180 capsule 1  . hydrOXYzine (ATARAX/VISTARIL) 10 MG tablet TAKE 1-2 TABLETS (10-20 MG TOTAL) BY MOUTH EVERY 8 (EIGHT) HOURS AS NEEDED FOR ANXIETY. 45 tablet 3  . ipratropium-albuterol (DUONEB) 0.5-2.5 (3) MG/3ML SOLN TAKE 3 MLS BY NEBULIZATION EVERY 4 (FOUR) HOURS AS NEEDED. 360 mL 0  . lisdexamfetamine (VYVANSE) 70 MG capsule Take 1 capsule (70 mg total) by mouth daily. 30 capsule 0  . [START ON 03/15/2020] lisdexamfetamine (VYVANSE) 70 MG capsule Take 1 capsule (70 mg total) by mouth daily. 30 capsule 0  . lisdexamfetamine (VYVANSE) 70 MG capsule Take 1 capsule (70 mg total) by mouth daily. 30 capsule 0  . lisdexamfetamine (VYVANSE) 70 MG capsule Take 1 capsule (70 mg total) by mouth daily. 30 capsule 0  . lisinopril-hydrochlorothiazide (ZESTORETIC) 20-25 MG tablet TAKE 1 TABLET BY MOUTH DAILY. 90 tablet 1  . lovastatin (MEVACOR) 40 MG tablet TAKE 1 TABLET BY MOUTH EVERYDAY AT BEDTIME 90 tablet 3  . omeprazole (PRILOSEC) 40 MG capsule Take 1 capsule (40 mg total) by mouth daily. 90 capsule 1  . rOPINIRole (REQUIP) 0.5 MG tablet Take 1 tablet (0.5 mg total) by mouth at bedtime. 90 tablet 0   No facility-administered medications prior to visit.    No Known Allergies  ROS Review of Systems    Objective:    Physical Exam  Constitutional: She is oriented to person, place,  and time. She appears well-developed and well-nourished.  HENT:  Head: Normocephalic and atraumatic.  Eyes: Conjunctivae and EOM are normal.  Cardiovascular: Normal rate.  Pulmonary/Chest: Effort normal.  Musculoskeletal:     Comments: Normal cervical flexion.  She is able to extend her neck but it is painful mostly in her chest.  She has significantly  decreased rotation right and left as well as side bending right and left. tender over the trapezius muscle on the left side and the paraspinous muscles on the left side.  Tender over the upper post sternum.  If she pulls her shoulders back it increases the pain and discomfort.  Nontender over the sternoclavicular joints or the ribs.  Right foot with 1+ pitting edema over the entire foot and up to the ankle.  She has tenderness over the second, third, and fourth distal metatarsals.  Most exquisitely tender at the distal third metatarsal.  She also has a couple of abrasions on the anterior ankle they are not actively draining but do look a little erythematous coming out about a centimeter from the edge of the wound.  Neurological: She is alert and oriented to person, place, and time.  Skin: Skin is dry. No pallor.  Psychiatric: She has a normal mood and affect. Her behavior is normal.  Vitals reviewed.   BP 133/60   Pulse 83   Ht 5\' 8"  (1.727 m)   Wt 156 lb (70.8 kg)   SpO2 100%   BMI 23.72 kg/m  Wt Readings from Last 3 Encounters:  02/18/20 156 lb (70.8 kg)  02/14/20 157 lb 1.6 oz (71.3 kg)  02/09/20 145 lb (65.8 kg)     Health Maintenance Due  Topic Date Due  . COLONOSCOPY  05/22/2009  . PAP SMEAR-Modifier  07/14/2018    There are no preventive care reminders to display for this patient.  Lab Results  Component Value Date   TSH 0.55 10/29/2018   Lab Results  Component Value Date   WBC 7.5 10/05/2019   HGB 12.0 10/05/2019   HCT 35.7 10/05/2019   MCV 91.1 10/05/2019   PLT 268 10/05/2019   Lab Results  Component Value  Date   NA 139 10/05/2019   K 4.3 10/05/2019   CO2 29 10/05/2019   GLUCOSE 93 10/05/2019   BUN 17 10/05/2019   CREATININE 1.03 (H) 10/05/2019   BILITOT 0.3 10/05/2019   ALKPHOS 52 05/20/2017   AST 19 10/05/2019   ALT 16 10/05/2019   PROT 6.6 10/05/2019   ALBUMIN 3.8 05/20/2017   CALCIUM 9.2 10/05/2019   Lab Results  Component Value Date   CHOL 214 (H) 05/20/2017   Lab Results  Component Value Date   HDL 44 (L) 05/20/2017   Lab Results  Component Value Date   LDLCALC 173 (H) 09/05/2016   Lab Results  Component Value Date   TRIG 177 (H) 05/20/2017   Lab Results  Component Value Date   CHOLHDL 4.9 05/20/2017   No results found for: HGBA1C    Assessment & Plan:   Problem List Items Addressed This Visit    None    Visit Diagnoses    Right foot pain    -  Primary   Relevant Orders   DG Foot Complete Right   Cellulitis of right foot       Relevant Orders   DG Foot Complete Right   Sternum pain       Relevant Orders   DG Sternum   Neck pain on left side         Right foot pain-we will get x-rays to evaluate for potential distal metatarsal fracture particularly over that third metatarsal though he is to use tender over the second third and fourth.  But it is suspicious especially with significant swelling present.  Sure to elevate ice and avoid pressure on  the foot.  We discussed even going ahead and putting her in a postop shoe.  She says she think she has a friend that has 1 and wants to ask them first.  Cellulitis of right foot-we will go ahead and treat with topical doxycycline.  Okay to continue cleaning with Hibiclens pat dry and apply Vaseline to moisturize the area.  Sternal pain-we will get x-ray just to rule out fracture but I suspect it is probably just more muscle strain.  Okay to use heat pack or ice whichever seems more comfortable.  Left neck pain - work on stretches. Given H.O.  Ok to use tyelnol or IBU  Frequent falls-did encourage her to give  neurology a call back on Monday and get scheduled for an appointment.   Meds ordered this encounter  Medications  . doxycycline (VIBRA-TABS) 100 MG tablet    Sig: Take 1 tablet (100 mg total) by mouth 2 (two) times daily.    Dispense:  20 tablet    Refill:  0    Follow-up: Return in about 2 weeks (around 03/03/2020), or recheck foot and neck and chest.    Nani Gasser, MD

## 2020-02-22 ENCOUNTER — Encounter: Payer: Self-pay | Admitting: Family Medicine

## 2020-03-06 ENCOUNTER — Ambulatory Visit: Payer: BC Managed Care – PPO | Admitting: Family Medicine

## 2020-03-15 ENCOUNTER — Ambulatory Visit (INDEPENDENT_AMBULATORY_CARE_PROVIDER_SITE_OTHER): Payer: BC Managed Care – PPO | Admitting: Family Medicine

## 2020-03-15 ENCOUNTER — Encounter: Payer: Self-pay | Admitting: Family Medicine

## 2020-03-15 VITALS — BP 114/66 | HR 92 | Temp 98.7°F | Ht 67.5 in

## 2020-03-15 DIAGNOSIS — M25562 Pain in left knee: Secondary | ICD-10-CM | POA: Diagnosis not present

## 2020-03-15 MED ORDER — SULFAMETHOXAZOLE-TRIMETHOPRIM 800-160 MG PO TABS: 2.0000 | ORAL_TABLET | Freq: Two times a day (BID) | ORAL | 0 refills | Status: AC

## 2020-03-15 MED ORDER — TRAMADOL HCL 50 MG PO TABS: 50.0000 mg | ORAL_TABLET | Freq: Three times a day (TID) | ORAL | 0 refills | Status: AC | PRN

## 2020-03-15 MED ORDER — COLCHICINE 0.6 MG PO TABS: ORAL_TABLET | ORAL | 0 refills | Status: DC

## 2020-03-15 NOTE — Progress Notes (Signed)
Glenda Fernandez - 59 y.o. female MRN [id number]  Date of birth: [birth year]  Subjective Chief Complaint  Patient presents with  . Knee Pain    HPI Glenda Fernandez is a 59 y.o. female here today with complaint of knee pain.  Reports that she got home from work last night and within a couple of hours her L knee started become painful and difficult to walk on.  It is swollen feeling, red and warm today.  She denies any known prior injury but did have fall a few weeks ago.  She was seen at that time but had no complaint of knee pain.  She denies history of gout.  She is able to bear weight but it is painful.  She denies fever or chills.   ROS:  A comprehensive ROS was completed and negative except as noted per HPI  No Known Allergies  Past Medical History:  Diagnosis Date  . Arthritis    osteo  . Asthma   . Depression   . GERD (gastroesophageal reflux disease)   . Iron deficiency anemia 09/11/2012    Past Surgical History:  Procedure Laterality Date  . BACK SURGERY  2001  . BLADDER SUSPENSION    . BREAST ENHANCEMENT SURGERY Left 1879  . CARPAL TUNNEL RELEASE Right 11/01/2016   Procedure: CARPAL TUNNEL RELEASE;  Surgeon: Dorna Leitz, MD;  Location: [facility];  Service: Orthopedics;  Laterality: Right;  . HAND SURGERY Left 2013   for fracture, [facility]  . NECK SURGERY    . ORIF WRIST FRACTURE Right 11/01/2016   Procedure: OPEN REDUCTION INTERNAL FIXATION (ORIF) WRIST FRACTURE;  Surgeon: Dorna Leitz, MD;  Location: [facility];  Service: Orthopedics;  Laterality: Right;  . SHOULDER SURGERY  2005    Social History   Socioeconomic History  . Marital status: Married    Spouse name: Herbie Baltimore  . Number of children: 2  . Years of education: Not on file  . Highest education level: Not on file  Occupational History  . Occupation: Evidence Tech    Comment: [employer]  Tobacco Use  . Smoking status: Current Every Day Smoker    Packs/day: 1.00    Years:  40.00    Pack years: 40.00  . Smokeless tobacco: Never Used  Substance and Sexual Activity  . Alcohol use: No  . Drug use: Not on file  . Sexual activity: Not on file  Other Topics Concern  . Not on file  Social History Narrative   Some college.  3 caffeine per day.  Walks 3 times per week for exercise.    Social Determinants of Health   Financial Resource Strain:   . Difficulty of Paying Living Expenses:   Food Insecurity:   . Worried About Charity fundraiser in the Last Year:   . Arboriculturist in the Last Year:   Transportation Needs:   . Film/video editor (Medical):   Marland Kitchen Lack of Transportation (Non-Medical):   Physical Activity:   . Days of Exercise per Week:   . Minutes of Exercise per Session:   Stress:   . Feeling of Stress :   Social Connections:   . Frequency of Communication with Friends and Family:   . Frequency of Social Gatherings with Friends and Family:   . Attends Religious Services:   . Active Member of Clubs or Organizations:   . Attends Archivist Meetings:   Marland Kitchen Marital Status:  Family History  Problem Relation Age of Onset  . Coronary artery disease Father   . Lung cancer Father        mesothelioma  . Hyperlipidemia Father   . Alcohol abuse Other        uncle  . Breast cancer Mother   . Kidney failure Mother   . Hyperlipidemia Mother   . Bipolar disorder Daughter   . Depression Daughter     Health Maintenance  Topic Date Due  . COLONOSCOPY  Never done  . PAP SMEAR-Modifier  07/14/2018  . MAMMOGRAM  09/29/2021  . TETANUS/TDAP  11/21/2027  . INFLUENZA VACCINE  Completed  . Hepatitis C Screening  Completed  . HIV Screening  Completed     ----------------------------------------------------------------------------------------------------------------------------------------------------------------------------------------------------------------- Physical Exam BP 114/66   Pulse 92   Temp 98.7 F (37.1 C) (Oral)   Ht  5' 7.5" (1.715 m)   BMI 24.07 kg/m   Physical Exam Constitutional:      Appearance: Normal appearance.  HENT:     Head: Normocephalic and atraumatic.  Cardiovascular:     Rate and Rhythm: Normal rate and regular rhythm.  Musculoskeletal:     Cervical back: Neck supple.     Comments: L knee erythematous, warm and tender to touch.  Small effusion.  She is able to bear weight.  Pain with flexion/extension.     Skin:    Comments: Small scabbed over area, lower lateral knee.  Appears to be healing well without purulence.   Neurological:     General: No focal deficit present.     Mental Status: She is alert.     ------------------------------------------------------------------------------------------------------------------------------------------------------------------------------------------------------------------- Assessment and Plan  Acute pain of left knee Attempted to aspirate knee from lateral suprapatellar pouch however she could not tolerate and remain still to complete procedure.   I think this is likely gout related to gout however ddx includes septic joint.  Check uric acid and CBC w/ diff Will start colchicine and tramadol to help with acute pain.  Will also cover with bactrim but she is instructed to return if symptoms worsen or she develops systemic symptoms including fever, chills or fatigue.  She expresses understanding.    Meds ordered this encounter  Medications  . traMADol (ULTRAM) 50 MG tablet    Sig: Take 1 tablet (50 mg total) by mouth every 8 (eight) hours as needed for up to 5 days.    Dispense:  15 tablet    Refill:  0  . colchicine 0.6 MG tablet    Sig: Take 1.2mg  initially followed by 0.6mg  in 1 hour.  Continue 0.6mg  daily until resolution    Dispense:  30 tablet    Refill:  0  . sulfamethoxazole-trimethoprim (BACTRIM DS) 800-160 MG tablet    Sig: Take 2 tablets by mouth 2 (two) times daily for 10 days.    Dispense:  40 tablet    Refill:  0     No follow-ups on file.    This visit occurred during the SARS-CoV-2 public health emergency.  Safety protocols were in place, including screening questions prior to the visit, additional usage of staff PPE, and extensive cleaning of exam room while observing appropriate contact time as indicated for disinfecting solutions.

## 2020-03-15 NOTE — Progress Notes (Signed)
Patient called and reports that she did not get the Bactrim. I called and spoke with the pharmacy and per [facility] the patient has picked up the Bactrim and Tramadol. No other questions. Left brief message for patient that per the pharmacy she had picked up all the prescriptions and to call back with any questions.

## 2020-03-15 NOTE — Patient Instructions (Addendum)
I have sent in medication for gout as well as medication to cover infection.  Stop by the lab and have labs completed.  If this continues to worsen or you develop fever please call our clinic.    Gout  Gout is painful swelling of your joints. Gout is a type of arthritis. It is caused by having too much uric acid in your body. Uric acid is a chemical that is made when your body breaks down substances called purines. If your body has too much uric acid, sharp crystals can form and build up in your joints. This causes pain and swelling. Gout attacks can happen quickly and be very painful (acute gout). Over time, the attacks can affect more joints and happen more often (chronic gout). What are the causes?  Too much uric acid in your blood. This can happen because: ? Your kidneys do not remove enough uric acid from your blood. ? Your body makes too much uric acid. ? You eat too many foods that are high in purines. These foods include organ meats, some seafood, and beer.  Trauma or stress. What increases the risk?  Having a family history of gout.  Being female and middle-aged.  Being female and having gone through menopause.  Being very overweight (obese).  Drinking alcohol, especially beer.  Not having enough water in the body (being dehydrated).  Losing weight too quickly.  Having an organ transplant.  Having lead poisoning.  Taking certain medicines.  Having kidney disease.  Having a skin condition called psoriasis. What are the signs or symptoms? An attack of acute gout usually happens in just one joint. The most common place is the big toe. Attacks often start at night. Other joints that may be affected include joints of the feet, ankle, knee, fingers, wrist, or elbow. Symptoms of an attack may include:  Very bad pain.  Warmth.  Swelling.  Stiffness.  Shiny, red, or purple skin.  Tenderness. The affected joint may be very painful to touch.  Chills and  fever. Chronic gout may cause symptoms more often. More joints may be involved. You may also have white or yellow lumps (tophi) on your hands or feet or in other areas near your joints. How is this treated?  Treatment for this condition has two phases: treating an acute attack and preventing future attacks.  Acute gout treatment may include: ? NSAIDs. ? Steroids. These are taken by mouth or injected into a joint. ? Colchicine. This medicine relieves pain and swelling. It can be given by mouth or through an IV tube.  Preventive treatment may include: ? Taking small doses of NSAIDs or colchicine daily. ? Using a medicine that reduces uric acid levels in your blood. ? Making changes to your diet. You may need to see a food expert (dietitian) about what to eat and drink to prevent gout. Follow these instructions at home: During a gout attack   If told, put ice on the painful area: ? Put ice in a plastic bag. ? Place a towel between your skin and the bag. ? Leave the ice on for 20 minutes, 2-3 times a day.  Raise (elevate) the painful joint above the level of your heart as often as you can.  Rest the joint as much as possible. If the joint is in your leg, you may be given crutches.  Follow instructions from your doctor about what you cannot eat or drink. Avoiding future gout attacks  Eat a low-purine diet. Avoid foods  and drinks such as: ? Liver. ? Kidney. ? Anchovies. ? Asparagus. ? Herring. ? Mushrooms. ? Mussels. ? Beer.  Stay at a healthy weight. If you want to lose weight, talk with your doctor. Do not lose weight too fast.  Start or continue an exercise plan as told by your doctor. Eating and drinking  Drink enough fluids to keep your pee (urine) pale yellow.  If you drink alcohol: ? Limit how much you use to:  0-1 drink a day for women.  0-2 drinks a day for men. ? Be aware of how much alcohol is in your drink. In the [place] one drink equals one 12 oz bottle of  beer (355 mL), one 5 oz glass of wine (148 mL), or one 1 oz glass of hard liquor (44 mL). General instructions  Take over-the-counter and prescription medicines only as told by your doctor.  Do not drive or use heavy machinery while taking prescription pain medicine.  Return to your normal activities as told by your doctor. Ask your doctor what activities are safe for you.  Keep all follow-up visits as told by your doctor. This is important. Contact a doctor if:  You have another gout attack.  You still have symptoms of a gout attack after 10 days of treatment.  You have problems (side effects) because of your medicines.  You have chills or a fever.  You have burning pain when you pee (urinate).  You have pain in your lower back or belly. Get help right away if:  You have very bad pain.  Your pain cannot be controlled.  You cannot pee. Summary  Gout is painful swelling of the joints.  The most common site of pain is the big toe, but it can affect other joints.  Medicines and avoiding some foods can help to prevent and treat gout attacks. This information is not intended to replace advice given to you by your health care provider. Make sure you discuss any questions you have with your health care provider. Document Revised: 06/24/2018 Document Reviewed: 06/24/2018 Elsevier Patient Education  [place].

## 2020-03-15 NOTE — Assessment & Plan Note (Signed)
Attempted to aspirate knee from lateral suprapatellar pouch however she could not tolerate and remain still to complete procedure.   I think this is likely gout related to gout however ddx includes septic joint.  Check uric acid and CBC w/ diff Will start colchicine and tramadol to help with acute pain.  Will also cover with bactrim but she is instructed to return if symptoms worsen or she develops systemic symptoms including fever, chills or fatigue.  She expresses understanding.

## 2020-03-16 ENCOUNTER — Encounter: Payer: Self-pay | Admitting: Family Medicine

## 2020-03-16 LAB — CBC WITH DIFFERENTIAL/PLATELET
Absolute Monocytes: 968 cells/uL — ABNORMAL HIGH (ref 200–950)
Basophils Absolute: 83 cells/uL (ref 0–200)
Basophils Relative: 0.7 %
Eosinophils Absolute: 448 cells/uL (ref 15–500)
Eosinophils Relative: 3.8 %
HCT: 34.7 % — ABNORMAL LOW (ref 35.0–45.0)
Hemoglobin: 11.7 g/dL (ref 11.7–15.5)
Lymphs Abs: 1746 cells/uL (ref 850–3900)
MCH: 30.2 pg (ref 27.0–33.0)
MCHC: 33.7 g/dL (ref 32.0–36.0)
MCV: 89.4 fL (ref 80.0–100.0)
MPV: 10.6 fL (ref 7.5–12.5)
Monocytes Relative: 8.2 %
Neutro Abs: 8555 cells/uL — ABNORMAL HIGH (ref 1500–7800)
Neutrophils Relative %: 72.5 %
Platelets: 273 10*3/uL (ref 140–400)
RBC: 3.88 10*6/uL (ref 3.80–5.10)
RDW: 13 % (ref 11.0–15.0)
Total Lymphocyte: 14.8 %
WBC: 11.8 10*3/uL — ABNORMAL HIGH (ref 3.8–10.8)

## 2020-03-16 LAB — URIC ACID: Uric Acid, Serum: 4.6 mg/dL (ref 2.5–7.0)

## 2020-03-28 ENCOUNTER — Encounter: Payer: Self-pay | Admitting: Family Medicine

## 2020-03-28 ENCOUNTER — Telehealth (INDEPENDENT_AMBULATORY_CARE_PROVIDER_SITE_OTHER): Payer: BC Managed Care – PPO | Admitting: Family Medicine

## 2020-03-28 DIAGNOSIS — R05 Cough: Secondary | ICD-10-CM | POA: Diagnosis not present

## 2020-03-28 DIAGNOSIS — R509 Fever, unspecified: Secondary | ICD-10-CM | POA: Diagnosis not present

## 2020-03-28 DIAGNOSIS — R059 Cough, unspecified: Secondary | ICD-10-CM

## 2020-03-28 NOTE — Progress Notes (Signed)
Virtual Visit via Telephone Note  I connected with Glenda Fernandez on 03/28/20 at  2:00 PM EDT by telephone and verified that I am speaking with the correct person using two identifiers.   I discussed the limitations, risks, security and privacy concerns of performing an evaluation and management service by telephone and the availability of in person appointments. I also discussed with the patient that there may be a patient responsible charge related to this service. The patient expressed understanding and agreed to proceed.  Patient location: at home.  Provider loccation: In office   Subjective:    CC: fever  HPI: 59 year old female says that night before last she started feeling bad she felt extremely tired and had a fever to 101.6.  She said she is also had a cough that she feels is coming from her chest.  She is had a mild scratchy throat and a lot of soreness in her neck and shoulders.  She did try her albuterol but says it did not really seem to help very much she is had a little bit of shortness of breath here and there but nothing significant.  She says she just finished a round of antibiotics for possible cellulitis versus gout in her knee.  She says her knee is actually much better the pain is almost gone and the swelling is significantly reduced.  She has not noticed any red streaking.  Sometimes it looks a little red.  She is had diffuse body aches.  Some headache.  She does not have a home pulse oximeter.   Past medical history, Surgical history, Family history not pertinant except as noted below, Social history, Allergies, and medications have been entered into the medical record, reviewed, and corrections made.   Review of Systems: No fevers, chills, night sweats, weight loss, chest pain, or shortness of breath.   Objective:    General: Speaking clearly in complete sentences without any shortness of breath.  Alert and oriented x3.  Normal judgment. No apparent acute  distress.    Impression and Recommendations:    Fever/URI sxs -suspect viral etiology.  Consider COVID-19 is a possibility.  Discussed getting her tested either through local [facility] or [facility] or we can get her scheduled tomorrow morning to get tested with a self swab PCR test.  She said she would prefer to do it tomorrow morning to give her warning signs and symptoms for which to go to the emergency department.  In the meantime did encourage her to rest and hydrate.    I discussed the assessment and treatment plan with the patient. The patient was provided an opportunity to ask questions and all were answered. The patient agreed with the plan and demonstrated an understanding of the instructions.   The patient was advised to call back or seek an in-person evaluation if the symptoms worsen or if the condition fails to improve as anticipated.  I provided 21 minutes of non-face-to-face time during this encounter.   Glenda Gasser, MD

## 2020-03-29 ENCOUNTER — Other Ambulatory Visit: Payer: Self-pay | Admitting: *Deleted

## 2020-03-29 DIAGNOSIS — R05 Cough: Secondary | ICD-10-CM

## 2020-03-29 DIAGNOSIS — R059 Cough, unspecified: Secondary | ICD-10-CM

## 2020-03-30 LAB — NOVEL CORONAVIRUS, NAA: SARS-CoV-2, NAA: NOT DETECTED

## 2020-03-30 LAB — SARS-COV-2, NAA 2 DAY TAT

## 2020-04-03 ENCOUNTER — Ambulatory Visit: Payer: BC Managed Care – PPO | Admitting: Sports Medicine

## 2020-04-05 ENCOUNTER — Ambulatory Visit: Payer: BC Managed Care – PPO | Admitting: Sports Medicine

## 2020-04-07 ENCOUNTER — Other Ambulatory Visit: Payer: Self-pay | Admitting: Family Medicine

## 2020-04-07 DIAGNOSIS — G2581 Restless legs syndrome: Secondary | ICD-10-CM

## 2020-04-07 DIAGNOSIS — K2100 Gastro-esophageal reflux disease with esophagitis, without bleeding: Secondary | ICD-10-CM

## 2020-04-20 ENCOUNTER — Other Ambulatory Visit: Payer: Self-pay | Admitting: Family Medicine

## 2020-04-20 DIAGNOSIS — F902 Attention-deficit hyperactivity disorder, combined type: Secondary | ICD-10-CM

## 2020-04-21 MED ORDER — LISDEXAMFETAMINE DIMESYLATE 70 MG PO CAPS: 70.0000 mg | ORAL_CAPSULE | Freq: Every day | ORAL | 0 refills | Status: DC

## 2020-04-21 NOTE — Telephone Encounter (Signed)
Follow up scheduled for June already   Last RX sent 03/15/20  RX pended

## 2020-04-22 ENCOUNTER — Other Ambulatory Visit: Payer: Self-pay | Admitting: Family Medicine

## 2020-05-17 ENCOUNTER — Other Ambulatory Visit: Payer: Self-pay

## 2020-05-17 DIAGNOSIS — F902 Attention-deficit hyperactivity disorder, combined type: Secondary | ICD-10-CM

## 2020-05-17 MED ORDER — LISDEXAMFETAMINE DIMESYLATE 70 MG PO CAPS: 70.0000 mg | ORAL_CAPSULE | Freq: Every day | ORAL | 0 refills | Status: DC

## 2020-05-17 NOTE — Telephone Encounter (Signed)
Pt has appt on 05/29/2020 for ADHD.  Pt states that she is currently out her medications and would like a refill at this time.  Please review and refill if appropriate.  Tiajuana Amass, CMA

## 2020-05-18 ENCOUNTER — Ambulatory Visit: Payer: BC Managed Care – PPO | Admitting: Family Medicine

## 2020-05-19 ENCOUNTER — Other Ambulatory Visit: Payer: Self-pay | Admitting: Family Medicine

## 2020-05-20 ENCOUNTER — Other Ambulatory Visit: Payer: Self-pay | Admitting: Family Medicine

## 2020-05-20 DIAGNOSIS — G2581 Restless legs syndrome: Secondary | ICD-10-CM

## 2020-05-29 ENCOUNTER — Telehealth (INDEPENDENT_AMBULATORY_CARE_PROVIDER_SITE_OTHER): Payer: BC Managed Care – PPO | Admitting: Family Medicine

## 2020-05-29 ENCOUNTER — Encounter: Payer: Self-pay | Admitting: Family Medicine

## 2020-05-29 VITALS — BP 118/73 | Temp 98.3°F | Ht 68.0 in | Wt 151.0 lb

## 2020-05-29 DIAGNOSIS — R05 Cough: Secondary | ICD-10-CM

## 2020-05-29 DIAGNOSIS — I10 Essential (primary) hypertension: Secondary | ICD-10-CM

## 2020-05-29 DIAGNOSIS — F902 Attention-deficit hyperactivity disorder, combined type: Secondary | ICD-10-CM

## 2020-05-29 DIAGNOSIS — R059 Cough, unspecified: Secondary | ICD-10-CM

## 2020-05-29 DIAGNOSIS — Z720 Tobacco use: Secondary | ICD-10-CM

## 2020-05-29 DIAGNOSIS — F330 Major depressive disorder, recurrent, mild: Secondary | ICD-10-CM

## 2020-05-29 MED ORDER — LISDEXAMFETAMINE DIMESYLATE 70 MG PO CAPS: 70.0000 mg | ORAL_CAPSULE | Freq: Every day | ORAL | 0 refills | Status: DC

## 2020-05-29 MED ORDER — ALBUTEROL SULFATE HFA 108 (90 BASE) MCG/ACT IN AERS: 2.0000 | INHALATION_SPRAY | Freq: Four times a day (QID) | RESPIRATORY_TRACT | 0 refills | Status: DC | PRN

## 2020-05-29 NOTE — Assessment & Plan Note (Signed)
Happy with her current regimen and asymptomatic.  Blood pressure well controlled.  Refill sent for the next 4 months.  Follow-up in 4 months.

## 2020-05-29 NOTE — Progress Notes (Signed)
Doing weill on current dosage.

## 2020-05-29 NOTE — Assessment & Plan Note (Signed)
Encourage smoking cessation.  She knows she needs to quit but is not actively interested in doing so.

## 2020-05-29 NOTE — Progress Notes (Signed)
Virtual Visit via Video Note  I connected with Glenda Fernandez on 05/29/20 at 10:30 AM EDT by a video enabled telemedicine application and verified that I am speaking with the correct person using two identifiers.   I discussed the limitations of evaluation and management by telemedicine and the availability of in person appointments. The patient expressed understanding and agreed to proceed.  Pt location: at home Provider location: at home.   Subjective:    CC: Follow-up ADD.  HPI:  ADD - Reports symptoms are well controlled on current regime. Denies any problems with insomnia, chest pain, palpitations, or SOB.    Also reports her blood pressures have been under great control she has been monitoring them periodically at home.  Tobacco abuse-she said she is actually never "tried to quit smoking right now she is not interested in quitting but knows that she probably should.  Past medical history, Surgical history, Family history not pertinant except as noted below, Social history, Allergies, and medications have been entered into the medical record, reviewed, and corrections made.   Review of Systems: No fevers, chills, night sweats, weight loss, chest pain, or shortness of breath.   Objective:    General: Speaking clearly in complete sentences without any shortness of breath.  Alert and oriented x3.  Normal judgment. No apparent acute distress.    Impression and Recommendations:    Essential hypertension Blood pressure looks great today.  Follow-up in 4 to 6 months.  ADHD (attention deficit hyperactivity disorder) Happy with her current regimen and asymptomatic.  Blood pressure well controlled.  Refill sent for the next 4 months.  Follow-up in 4 months.  Tobacco abuse Encourage smoking cessation.  She knows she needs to quit but is not actively interested in doing so.  Depression Still a fair amount of stress at home but says she is doing well on her fluoxetine she currently  takes 80 mg a day and feels like she is happy with her regimen.   Due for CMP and lipid panel.  Labs ordered and encouraged her to go in the next couple of weeks if at all possible.   Time spent in encounter 25 minutes  I discussed the assessment and treatment plan with the patient. The patient was provided an opportunity to ask questions and all were answered. The patient agreed with the plan and demonstrated an understanding of the instructions.   The patient was advised to call back or seek an in-person evaluation if the symptoms worsen or if the condition fails to improve as anticipated.   Nani Gasser, MD

## 2020-05-29 NOTE — Assessment & Plan Note (Signed)
Still a fair amount of stress at home but says she is doing well on her fluoxetine she currently takes 80 mg a day and feels like she is happy with her regimen.

## 2020-05-29 NOTE — Assessment & Plan Note (Signed)
Blood pressure looks great today.  Follow-up in 4 to 6 months.

## 2020-06-12 ENCOUNTER — Other Ambulatory Visit: Payer: Self-pay | Admitting: Family Medicine

## 2020-06-23 ENCOUNTER — Other Ambulatory Visit: Payer: Self-pay | Admitting: Family Medicine

## 2020-07-21 ENCOUNTER — Other Ambulatory Visit: Payer: Self-pay | Admitting: Family Medicine

## 2020-07-21 DIAGNOSIS — I10 Essential (primary) hypertension: Secondary | ICD-10-CM

## 2020-08-17 ENCOUNTER — Other Ambulatory Visit: Payer: Self-pay | Admitting: Family Medicine

## 2020-08-17 DIAGNOSIS — G2581 Restless legs syndrome: Secondary | ICD-10-CM

## 2020-08-22 ENCOUNTER — Encounter: Payer: Self-pay | Admitting: Family Medicine

## 2020-08-23 ENCOUNTER — Other Ambulatory Visit: Payer: Self-pay | Admitting: *Deleted

## 2020-08-23 DIAGNOSIS — R059 Cough, unspecified: Secondary | ICD-10-CM

## 2020-08-23 MED ORDER — ALBUTEROL SULFATE HFA 108 (90 BASE) MCG/ACT IN AERS: 2.0000 | INHALATION_SPRAY | Freq: Four times a day (QID) | RESPIRATORY_TRACT | 99 refills | Status: DC | PRN

## 2020-09-15 ENCOUNTER — Other Ambulatory Visit: Payer: Self-pay | Admitting: Family Medicine

## 2020-09-15 DIAGNOSIS — K2100 Gastro-esophageal reflux disease with esophagitis, without bleeding: Secondary | ICD-10-CM

## 2020-09-18 ENCOUNTER — Encounter: Payer: Self-pay | Admitting: Gastroenterology

## 2020-10-06 ENCOUNTER — Encounter: Payer: Self-pay | Admitting: Family Medicine

## 2020-10-06 DIAGNOSIS — F902 Attention-deficit hyperactivity disorder, combined type: Secondary | ICD-10-CM

## 2020-10-06 MED ORDER — LISDEXAMFETAMINE DIMESYLATE 70 MG PO CAPS: 70.0000 mg | ORAL_CAPSULE | Freq: Every day | ORAL | 0 refills | Status: DC

## 2020-10-06 NOTE — Telephone Encounter (Signed)
Follow up notes from appt on 6/14 state to follow up in 4-6 months  OK to refill her Vyvanse?   I have pended refill

## 2020-10-30 ENCOUNTER — Other Ambulatory Visit: Payer: Self-pay | Admitting: Family Medicine

## 2020-11-01 ENCOUNTER — Encounter: Payer: BC Managed Care – PPO | Admitting: Gastroenterology

## 2020-11-03 ENCOUNTER — Telehealth (INDEPENDENT_AMBULATORY_CARE_PROVIDER_SITE_OTHER): Payer: Managed Care, Other (non HMO) | Admitting: Family Medicine

## 2020-11-03 ENCOUNTER — Encounter: Payer: Self-pay | Admitting: Family Medicine

## 2020-11-03 VITALS — BP 118/76 | Ht 68.0 in | Wt 145.0 lb

## 2020-11-03 DIAGNOSIS — F902 Attention-deficit hyperactivity disorder, combined type: Secondary | ICD-10-CM | POA: Diagnosis not present

## 2020-11-03 DIAGNOSIS — E7800 Pure hypercholesterolemia, unspecified: Secondary | ICD-10-CM

## 2020-11-03 DIAGNOSIS — I10 Essential (primary) hypertension: Secondary | ICD-10-CM

## 2020-11-03 DIAGNOSIS — Z1231 Encounter for screening mammogram for malignant neoplasm of breast: Secondary | ICD-10-CM

## 2020-11-03 MED ORDER — LOVASTATIN 40 MG PO TABS: ORAL_TABLET | ORAL | 3 refills | Status: DC

## 2020-11-03 MED ORDER — LISDEXAMFETAMINE DIMESYLATE 70 MG PO CAPS: 70.0000 mg | ORAL_CAPSULE | Freq: Every day | ORAL | 0 refills | Status: DC

## 2020-11-03 NOTE — Progress Notes (Signed)
Pt reports that she has had to increase the amount of the requip due to her experiencing more leg cramping.

## 2020-11-03 NOTE — Assessment & Plan Note (Signed)
Well on current regimen.  Ready read and refill sent for 90 days.

## 2020-11-03 NOTE — Assessment & Plan Note (Addendum)
Well controlled. Continue current regimen. Follow up in  3-4 months. Encouraged her to schedule her mammogram. Flu shot and and labs all on the same day.  I really think we can get the coordinated for her.

## 2020-11-03 NOTE — Assessment & Plan Note (Addendum)
Due to recheck lipids. RF statin. Need to check liver function

## 2020-11-03 NOTE — Progress Notes (Signed)
Virtual Visit via Video Note  I connected with Glenda Fernandez on 11/03/20 at  3:20 PM EST by a video enabled telemedicine application and verified that I am speaking with the correct person using two identifiers.   I discussed the limitations of evaluation and management by telemedicine and the availability of in person appointments. The patient expressed understanding and agreed to proceed.  Patient location: at home  Provider location: in office   Established Patient Office Visit  Subjective:  Patient ID: Glenda Fernandez, female    DOB: 09-Jun-1961  Age: 59 y.o. MRN: [id number]  CC:  Chief Complaint  Patient presents with  . ADHD    HPI Glenda Fernandez presents for   Hypertension- Pt denies chest pain, SOB, dizziness, or heart palpitations.  Taking meds as directed w/o problems.  Denies medication side effects.  Home blood pressures have been great.  She reports that she still has not had a chance to go get blood work she is had a lot of financial stressors and a couple family members who have needed surgery recently and so just has not had the time to come and get it done.  ADD - Reports symptoms are well controlled on current regime. Denies any problems with insomnia, chest pain, palpitations, or SOB.     Past Medical History:  Diagnosis Date  . Arthritis    osteo  . Asthma   . Depression   . GERD (gastroesophageal reflux disease)   . Iron deficiency anemia 09/11/2012    Past Surgical History:  Procedure Laterality Date  . BACK SURGERY  2001  . BLADDER SUSPENSION    . BREAST ENHANCEMENT SURGERY Left 1879  . CARPAL TUNNEL RELEASE Right 11/01/2016   Procedure: CARPAL TUNNEL RELEASE;  Surgeon: Jodi Geralds, MD;  Location: [facility];  Service: Orthopedics;  Laterality: Right;  . HAND SURGERY Left 2013   for fracture, [place] [facility]  . NECK SURGERY    . ORIF WRIST FRACTURE Right 11/01/2016   Procedure: OPEN REDUCTION INTERNAL FIXATION (ORIF) WRIST FRACTURE;  Surgeon:  Jodi Geralds, MD;  Location: [facility];  Service: Orthopedics;  Laterality: Right;  . SHOULDER SURGERY  2005    Family History  Problem Relation Age of Onset  . Coronary artery disease Father   . Lung cancer Father        mesothelioma  . Hyperlipidemia Father   . Alcohol abuse Other        uncle  . Breast cancer Mother   . Kidney failure Mother   . Hyperlipidemia Mother   . Bipolar disorder Daughter   . Depression Daughter     Social History   Socioeconomic History  . Marital status: Married    Spouse name: Molly Maduro  . Number of children: 2  . Years of education: Not on file  . Highest education level: Not on file  Occupational History  . Occupation: Evidence Tech    Comment: [employer]  Tobacco Use  . Smoking status: Current Every Day Smoker    Packs/day: 1.00    Years: 40.00    Pack years: 40.00  . Smokeless tobacco: Never Used  Substance and Sexual Activity  . Alcohol use: No  . Drug use: Not on file  . Sexual activity: Not on file  Other Topics Concern  . Not on file  Social History Narrative   Some college.  3 caffeine per day.  Walks 3 times per week for exercise.  Social Determinants of Health   Financial Resource Strain:   . Difficulty of Paying Living Expenses: Not on file  Food Insecurity:   . Worried About Programme researcher, broadcasting/film/video in the Last Year: Not on file  . Ran Out of Food in the Last Year: Not on file  Transportation Needs:   . Lack of Transportation (Medical): Not on file  . Lack of Transportation (Non-Medical): Not on file  Physical Activity:   . Days of Exercise per Week: Not on file  . Minutes of Exercise per Session: Not on file  Stress:   . Feeling of Stress : Not on file  Social Connections:   . Frequency of Communication with Friends and Family: Not on file  . Frequency of Social Gatherings with Friends and Family: Not on file  . Attends Religious Services: Not on file  . Active Member of Clubs or Organizations:  Not on file  . Attends Banker Meetings: Not on file  . Marital Status: Not on file  Intimate Partner Violence:   . Fear of Current or Ex-Partner: Not on file  . Emotionally Abused: Not on file  . Physically Abused: Not on file  . Sexually Abused: Not on file    Outpatient Medications Prior to Visit  Medication Sig Dispense Refill  . albuterol (VENTOLIN HFA) 108 (90 Base) MCG/ACT inhaler Inhale 2 puffs into the lungs every 6 (six) hours as needed for wheezing or shortness of breath. 18 g prn  . diclofenac sodium (VOLTAREN) 1 % GEL Apply 4 g topically 4 (four) times daily. To affected joint. 100 g 11  . FLUoxetine (PROZAC) 40 MG capsule TAKE 2 CAPSULES (80 MG TOTAL) BY MOUTH DAILY. 180 capsule 1  . hydrOXYzine (ATARAX/VISTARIL) 10 MG tablet TAKE 1-2 TABLETS (10-20 MG TOTAL) BY MOUTH EVERY 8 (EIGHT) HOURS AS NEEDED FOR ANXIETY. 45 tablet 3  . ipratropium-albuterol (DUONEB) 0.5-2.5 (3) MG/3ML SOLN TAKE 3 MLS BY NEBULIZATION EVERY 4 (FOUR) HOURS AS NEEDED. 360 mL 0  . lisdexamfetamine (VYVANSE) 70 MG capsule Take 1 capsule (70 mg total) by mouth daily. 30 capsule 0  . lisinopril-hydrochlorothiazide (ZESTORETIC) 20-25 MG tablet TAKE 1 TABLET BY MOUTH DAILY. 90 tablet 1  . omeprazole (PRILOSEC) 40 MG capsule TAKE 1 CAPSULE BY MOUTH EVERY DAY 90 capsule 1  . rOPINIRole (REQUIP) 0.5 MG tablet TAKE 1 TABLET BY MOUTH AT BEDTIME. 90 tablet 1  . lisdexamfetamine (VYVANSE) 70 MG capsule Take 1 capsule (70 mg total) by mouth daily. 30 capsule 0  . lisdexamfetamine (VYVANSE) 70 MG capsule Take 1 capsule (70 mg total) by mouth daily. 30 capsule 0  . lisdexamfetamine (VYVANSE) 70 MG capsule Take 1 capsule (70 mg total) by mouth daily. 30 capsule 0  . lovastatin (MEVACOR) 40 MG tablet TAKE 1 TABLET BY MOUTH EVERYDAY AT BEDTIME 30 tablet 0  . budesonide-formoterol (SYMBICORT) 160-4.5 MCG/ACT inhaler TAKE 2 PUFFS BY MOUTH TWICE A DAY 30.6 Inhaler 1  . colchicine 0.6 MG tablet Take 1.2mg  initially  followed by 0.6mg  in 1 hour.  Continue 0.6mg  daily until resolution 30 tablet 0   No facility-administered medications prior to visit.    No Known Allergies  ROS Review of Systems    Objective:    Physical Exam Vitals reviewed.  Constitutional:      Appearance: She is well-developed.  HENT:     Head: Normocephalic and atraumatic.  Eyes:     Conjunctiva/sclera: Conjunctivae normal.  Cardiovascular:     Rate and  Rhythm: Normal rate.  Pulmonary:     Effort: Pulmonary effort is normal.  Skin:    General: Skin is dry.     Coloration: Skin is not pale.  Neurological:     Mental Status: She is alert and oriented to person, place, and time.  Psychiatric:        Behavior: Behavior normal.     BP 118/76   Ht 5\' 8"  (1.727 m)   Wt 145 lb (65.8 kg)   BMI 22.05 kg/m  Wt Readings from Last 3 Encounters:  11/03/20 145 lb (65.8 kg)  05/29/20 151 lb (68.5 kg)  02/18/20 156 lb (70.8 kg)     There are no preventive care reminders to display for this patient.  There are no preventive care reminders to display for this patient.  Lab Results  Component Value Date   TSH 0.55 10/29/2018   Lab Results  Component Value Date   WBC 11.8 (H) 03/15/2020   HGB 11.7 03/15/2020   HCT 34.7 (L) 03/15/2020   MCV 89.4 03/15/2020   PLT 273 03/15/2020   Lab Results  Component Value Date   NA 139 10/05/2019   K 4.3 10/05/2019   CO2 29 10/05/2019   GLUCOSE 93 10/05/2019   BUN 17 10/05/2019   CREATININE 1.03 (H) 10/05/2019   BILITOT 0.3 10/05/2019   ALKPHOS 52 05/20/2017   AST 19 10/05/2019   ALT 16 10/05/2019   PROT 6.6 10/05/2019   ALBUMIN 3.8 05/20/2017   CALCIUM 9.2 10/05/2019   Lab Results  Component Value Date   CHOL 214 (H) 05/20/2017   Lab Results  Component Value Date   HDL 44 (L) 05/20/2017   Lab Results  Component Value Date   LDLCALC 173 (H) 09/05/2016   Lab Results  Component Value Date   TRIG 177 (H) 05/20/2017   Lab Results  Component Value Date    CHOLHDL 4.9 05/20/2017   No results found for: HGBA1C    Assessment & Plan:   Problem List Items Addressed This Visit      Cardiovascular and Mediastinum   Essential hypertension    Well controlled. Continue current regimen. Follow up in  3-4 months. Encouraged her to schedule her mammogram. Flu shot and and labs all on the same day.  I really think we can get the coordinated for her.       Relevant Medications   lovastatin (MEVACOR) 40 MG tablet     Other   Hyperlipidemia    Due to recheck lipids. RF statin. Need to check liver function      Relevant Medications   lovastatin (MEVACOR) 40 MG tablet   ADHD (attention deficit hyperactivity disorder) - Primary    Well on current regimen.  Ready read and refill sent for 90 days.      Relevant Medications   lisdexamfetamine (VYVANSE) 70 MG capsule (Start on 01/01/2021)   lisdexamfetamine (VYVANSE) 70 MG capsule (Start on 12/02/2020)   lisdexamfetamine (VYVANSE) 70 MG capsule    Other Visit Diagnoses    Screening mammogram for breast cancer       Relevant Orders   MM 3D SCREEN BREAST BILATERAL      Meds ordered this encounter  Medications  . lovastatin (MEVACOR) 40 MG tablet    Sig: TAKE 1 TABLET BY MOUTH EVERYDAY AT BEDTIME    Dispense:  90 tablet    Refill:  3  . lisdexamfetamine (VYVANSE) 70 MG capsule    Sig: Take 1 capsule (  70 mg total) by mouth daily.    Dispense:  30 capsule    Refill:  0  . lisdexamfetamine (VYVANSE) 70 MG capsule    Sig: Take 1 capsule (70 mg total) by mouth daily.    Dispense:  30 capsule    Refill:  0  . lisdexamfetamine (VYVANSE) 70 MG capsule    Sig: Take 1 capsule (70 mg total) by mouth daily.    Dispense:  30 capsule    Refill:  0    Follow-up: No follow-ups on file.     Time spent in encounter 20 minutes  I discussed the assessment and treatment plan with the patient. The patient was provided an opportunity to ask questions and all were answered. The patient agreed with the  plan and demonstrated an understanding of the instructions.   The patient was advised to call back or seek an in-person evaluation if the symptoms worsen or if the condition fails to improve as anticipated.  Nani Gasseratherine Rayme Bui, MD

## 2020-11-27 ENCOUNTER — Ambulatory Visit: Payer: Managed Care, Other (non HMO) | Admitting: Family Medicine

## 2020-12-05 ENCOUNTER — Ambulatory Visit: Payer: Managed Care, Other (non HMO) | Admitting: Family Medicine

## 2020-12-29 ENCOUNTER — Telehealth (INDEPENDENT_AMBULATORY_CARE_PROVIDER_SITE_OTHER): Payer: Managed Care, Other (non HMO) | Admitting: Family Medicine

## 2020-12-29 DIAGNOSIS — R509 Fever, unspecified: Secondary | ICD-10-CM | POA: Diagnosis not present

## 2020-12-29 DIAGNOSIS — R55 Syncope and collapse: Secondary | ICD-10-CM

## 2020-12-29 DIAGNOSIS — F330 Major depressive disorder, recurrent, mild: Secondary | ICD-10-CM

## 2020-12-29 DIAGNOSIS — R202 Paresthesia of skin: Secondary | ICD-10-CM

## 2020-12-29 NOTE — Progress Notes (Signed)
Virtual Visit via Video Note  I connected with Glenda DillWendy P Sanjuan on 01/01/21 at  1:40 PM EST by a video enabled telemedicine application and verified that I am speaking with the correct person using two identifiers.   I discussed the limitations of evaluation and management by telemedicine and the availability of in person appointments. The patient expressed understanding and agreed to proceed.  Patient location: at home  Provider location: in office   Established Patient Office Visit  Subjective:  Patient ID: Glenda Fernandez, female    DOB: 08/28/1961  Age: 60 y.o. MRN: [id number]  CC:  Chief Complaint  Patient presents with  . hand numbnes  . Fall    Fell back and hit her head     HPI Glenda DillWendy P Billey presents for recurrent onset of numbness and tingling in her hands, worse on the L>R.  Similar to when she was having sxs back in 2019.  Had surgery Dr. Herbie SaxonKyle Cabbel   Says yesterday felt she was having a fever and went to get a thermometer and "passed out" says she woke up before she hit the floor but bruised her left forearm.  No skin breaks. Thinks she may have hit her head. She feels like she is getting sick. Has a ST today. Has been super thirsty the last 3 days.  No GI sxs.  No cough or congestion.  [facility]  They only gave her her statin and BP pill. Wasn't given her Paxil. She has restarted it at 40mg  daily.   MRI Study Result 2019  Narrative & Impression  CLINICAL DATA:  Cervical radiculopathy.  Minor trauma.  Recent fall.  EXAM: MRI CERVICAL SPINE WITHOUT CONTRAST  TECHNIQUE: Multiplanar, multisequence MR imaging of the cervical spine was performed. No intravenous contrast was administered.  COMPARISON:  Cervical spine radiographs 10/29/2018  FINDINGS: Alignment: 2 mm retrolisthesis C3-4.  Vertebrae: Negative for fracture or mass lesion  Cord: Ill-defined cord hyperintensity at the C3 level with associated moderate spinal stenosis.  This could be due to recent cord injury. Remaining cord signal normal  Posterior Fossa, vertebral arteries, paraspinal tissues: Negative  Disc levels:  C1-2: Moderate disc degeneration. Pannus posterior to the dens without significant spinal stenosis  C2-3: Moderate right foraminal encroachment due to uncinate spurring.  C3-4: 2 mm retrolisthesis. Broad-based disc protrusion with associated spurring. Cord flattening with moderate spinal stenosis. Ill-defined cord hyperintensity could be due to acute injury. Moderate foraminal stenosis bilaterally  C4-5: Severe right foraminal encroachment due to uncinate spurring and severe facet hypertrophy. Mild spinal stenosis.  C5-6: Disc degeneration and diffuse uncinate spurring. Mild spinal stenosis and mild foraminal stenosis bilaterally  C6-7: Moderate right foraminal encroachment due to spurring  C7-T1: Negative  T1-2: Central disc protrusion without stenosis  IMPRESSION: Moderate spinal stenosis at C3-4. Broad-based disc protrusion and spurring. Ill-defined cord hyperintensity may be due to acute injury.  Mild spinal stenosis C4-5 with severe right foraminal encroachment due to spurring.  Mild spinal stenosis and mild foraminal stenosis bilaterally C5-6  Moderate right foraminal stenosis C6-7  These results will be called to the ordering clinician or representative by the Radiologist Assistant, and communication documented in the PACS or zVision Dashboard.   Electronically Signed   By: Marlan Palauharles  Clark M.D.     Past Medical History:  Diagnosis Date  . Arthritis    osteo  . Asthma   . Depression   . GERD (gastroesophageal reflux disease)   .  Iron deficiency anemia 09/11/2012    Past Surgical History:  Procedure Laterality Date  . BACK SURGERY  2001  . BLADDER SUSPENSION    . BREAST ENHANCEMENT SURGERY Left 1879  . CARPAL TUNNEL RELEASE Right 11/01/2016   Procedure: CARPAL TUNNEL RELEASE;   Surgeon: Jodi Geralds, MD;  Location: [facility];  Service: Orthopedics;  Laterality: Right;  . HAND SURGERY Left 2013   for fracture, [facility] [facility]  . NECK SURGERY    . ORIF WRIST FRACTURE Right 11/01/2016   Procedure: OPEN REDUCTION INTERNAL FIXATION (ORIF) WRIST FRACTURE;  Surgeon: Jodi Geralds, MD;  Location: [facility];  Service: Orthopedics;  Laterality: Right;  . SHOULDER SURGERY  2005    Family History  Problem Relation Age of Onset  . Coronary artery disease Father   . Lung cancer Father        mesothelioma  . Hyperlipidemia Father   . Alcohol abuse Other        uncle  . Breast cancer Mother   . Kidney failure Mother   . Hyperlipidemia Mother   . Bipolar disorder Daughter   . Depression Daughter     Social History   Socioeconomic History  . Marital status: Married    Spouse name: Molly Maduro  . Number of children: 2  . Years of education: Not on file  . Highest education level: Not on file  Occupational History  . Occupation: Evidence Tech    Comment: [employer]  Tobacco Use  . Smoking status: Current Every Day Smoker    Packs/day: 1.00    Years: 40.00    Pack years: 40.00  . Smokeless tobacco: Never Used  Substance and Sexual Activity  . Alcohol use: No  . Drug use: Not on file  . Sexual activity: Not on file  Other Topics Concern  . Not on file  Social History Narrative   Some college.  3 caffeine per day.  Walks 3 times per week for exercise.    Social Determinants of Health   Financial Resource Strain: Not on file  Food Insecurity: Not on file  Transportation Needs: Not on file  Physical Activity: Not on file  Stress: Not on file  Social Connections: Not on file  Intimate Partner Violence: Not on file    Outpatient Medications Prior to Visit  Medication Sig Dispense Refill  . albuterol (VENTOLIN HFA) 108 (90 Base) MCG/ACT inhaler Inhale 2 puffs into the lungs every 6 (six) hours as needed for wheezing or shortness of  breath. 18 g prn  . diclofenac sodium (VOLTAREN) 1 % GEL Apply 4 g topically 4 (four) times daily. To affected joint. 100 g 11  . FLUoxetine (PROZAC) 40 MG capsule TAKE 2 CAPSULES (80 MG TOTAL) BY MOUTH DAILY. 180 capsule 1  . hydrOXYzine (ATARAX/VISTARIL) 10 MG tablet TAKE 1-2 TABLETS (10-20 MG TOTAL) BY MOUTH EVERY 8 (EIGHT) HOURS AS NEEDED FOR ANXIETY. 45 tablet 3  . ipratropium-albuterol (DUONEB) 0.5-2.5 (3) MG/3ML SOLN TAKE 3 MLS BY NEBULIZATION EVERY 4 (FOUR) HOURS AS NEEDED. 360 mL 0  . lisdexamfetamine (VYVANSE) 70 MG capsule Take 1 capsule (70 mg total) by mouth daily. 30 capsule 0  . lisdexamfetamine (VYVANSE) 70 MG capsule Take 1 capsule (70 mg total) by mouth daily. 30 capsule 0  . lisdexamfetamine (VYVANSE) 70 MG capsule Take 1 capsule (70 mg total) by mouth daily. 30 capsule 0  . lisdexamfetamine (VYVANSE) 70 MG capsule Take 1 capsule (70 mg total) by mouth  daily. 30 capsule 0  . lisinopril-hydrochlorothiazide (ZESTORETIC) 20-25 MG tablet TAKE 1 TABLET BY MOUTH DAILY. 90 tablet 1  . lovastatin (MEVACOR) 40 MG tablet TAKE 1 TABLET BY MOUTH EVERYDAY AT BEDTIME 90 tablet 3  . omeprazole (PRILOSEC) 40 MG capsule TAKE 1 CAPSULE BY MOUTH EVERY DAY 90 capsule 1  . rOPINIRole (REQUIP) 0.5 MG tablet TAKE 1 TABLET BY MOUTH AT BEDTIME. 90 tablet 1   No facility-administered medications prior to visit.    No Known Allergies  ROS Review of Systems    Objective:    Physical Exam  There were no vitals taken for this visit. Wt Readings from Last 3 Encounters:  11/03/20 145 lb (65.8 kg)  05/29/20 151 lb (68.5 kg)  02/18/20 156 lb (70.8 kg)     There are no preventive care reminders to display for this patient.  There are no preventive care reminders to display for this patient.  Lab Results  Component Value Date   TSH 0.55 10/29/2018   Lab Results  Component Value Date   WBC 11.8 (H) 03/15/2020   HGB 11.7 03/15/2020   HCT 34.7 (L) 03/15/2020   MCV 89.4 03/15/2020   PLT  273 03/15/2020   Lab Results  Component Value Date   NA 139 10/05/2019   K 4.3 10/05/2019   CO2 29 10/05/2019   GLUCOSE 93 10/05/2019   BUN 17 10/05/2019   CREATININE 1.03 (H) 10/05/2019   BILITOT 0.3 10/05/2019   ALKPHOS 52 05/20/2017   AST 19 10/05/2019   ALT 16 10/05/2019   PROT 6.6 10/05/2019   ALBUMIN 3.8 05/20/2017   CALCIUM 9.2 10/05/2019   Lab Results  Component Value Date   CHOL 214 (H) 05/20/2017   Lab Results  Component Value Date   HDL 44 (L) 05/20/2017   Lab Results  Component Value Date   LDLCALC 173 (H) 09/05/2016   Lab Results  Component Value Date   TRIG 177 (H) 05/20/2017   Lab Results  Component Value Date   CHOLHDL 4.9 05/20/2017   No results found for: HGBA1C    Assessment & Plan:   Problem List Items Addressed This Visit      Other   Depression    Has restarted her paxil at a lower dose.  Her SSRI should not have been [facility] abruptly by the jail.        Other Visit Diagnoses    Paresthesia of both hands    -  Primary   Relevant Orders   MR Cervical Spine Wo Contrast   Fever, unspecified fever cause       Syncope, unspecified syncope type         Fever with sore throat - consider viral illness vs COVID vs strep throat. Recommend consider COVID testing. She does live with several family members.   Numbness and tingling of her hands - sxs are similar to when she had sig cord compression that required surgery. Will get MRI for further workup.    Syncope - unclear etiology. I would prefer to see her in person for work up to include BP, pulse orthostatics, possible EKG etc but she was unable to get her today. If occurs again please go to the ED.  She will try to schedule for in person.    No orders of the defined types were placed in this encounter.   Follow-up: No follow-ups on file.    Time spent in encounter 32 minutes  I discussed the assessment  and treatment plan with the patient. The patient was provided an opportunity to  ask questions and all were answered. The patient agreed with the plan and demonstrated an understanding of the instructions.   The patient was advised to call back or seek an in-person evaluation if the symptoms worsen or if the condition fails to improve as anticipated.   Nani Gasser, MD

## 2020-12-29 NOTE — Progress Notes (Signed)
She said that she is starting to have the same issues with balance like she was before.   She said that she was standing at the counter this morning and fell back and hit her head. She denies any LOC.

## 2021-01-01 ENCOUNTER — Encounter: Payer: Self-pay | Admitting: Family Medicine

## 2021-01-01 NOTE — Assessment & Plan Note (Signed)
Has restarted her paxil at a lower dose.  Her SSRI should not have been stopped abruptly by the jail.

## 2021-01-02 ENCOUNTER — Encounter: Payer: Self-pay | Admitting: Family Medicine

## 2021-01-11 ENCOUNTER — Other Ambulatory Visit: Payer: Self-pay | Admitting: Radiology

## 2021-01-23 ENCOUNTER — Ambulatory Visit: Payer: Managed Care, Other (non HMO) | Admitting: Family Medicine

## 2021-01-25 ENCOUNTER — Other Ambulatory Visit: Payer: Self-pay | Admitting: Family Medicine

## 2021-01-25 ENCOUNTER — Ambulatory Visit: Payer: Managed Care, Other (non HMO) | Admitting: Family Medicine

## 2021-01-25 DIAGNOSIS — I10 Essential (primary) hypertension: Secondary | ICD-10-CM

## 2021-01-27 ENCOUNTER — Ambulatory Visit (INDEPENDENT_AMBULATORY_CARE_PROVIDER_SITE_OTHER): Payer: Managed Care, Other (non HMO)

## 2021-01-27 ENCOUNTER — Other Ambulatory Visit: Payer: Self-pay

## 2021-01-27 DIAGNOSIS — M47812 Spondylosis without myelopathy or radiculopathy, cervical region: Secondary | ICD-10-CM | POA: Diagnosis not present

## 2021-01-27 DIAGNOSIS — M4802 Spinal stenosis, cervical region: Secondary | ICD-10-CM | POA: Diagnosis not present

## 2021-01-27 DIAGNOSIS — R202 Paresthesia of skin: Secondary | ICD-10-CM

## 2021-01-29 ENCOUNTER — Encounter: Payer: Self-pay | Admitting: Family Medicine

## 2021-01-29 ENCOUNTER — Other Ambulatory Visit: Payer: Self-pay

## 2021-01-29 ENCOUNTER — Ambulatory Visit (INDEPENDENT_AMBULATORY_CARE_PROVIDER_SITE_OTHER): Payer: Managed Care, Other (non HMO) | Admitting: Family Medicine

## 2021-01-29 VITALS — BP 128/54 | HR 93 | Ht 68.0 in | Wt 146.0 lb

## 2021-01-29 DIAGNOSIS — M4802 Spinal stenosis, cervical region: Secondary | ICD-10-CM

## 2021-01-29 DIAGNOSIS — F902 Attention-deficit hyperactivity disorder, combined type: Secondary | ICD-10-CM | POA: Diagnosis not present

## 2021-01-29 DIAGNOSIS — Z23 Encounter for immunization: Secondary | ICD-10-CM | POA: Diagnosis not present

## 2021-01-29 DIAGNOSIS — E7800 Pure hypercholesterolemia, unspecified: Secondary | ICD-10-CM | POA: Diagnosis not present

## 2021-01-29 DIAGNOSIS — I10 Essential (primary) hypertension: Secondary | ICD-10-CM | POA: Diagnosis not present

## 2021-01-29 MED ORDER — LISDEXAMFETAMINE DIMESYLATE 70 MG PO CAPS
70.0000 mg | ORAL_CAPSULE | Freq: Every day | ORAL | 0 refills | Status: DC
Start: 2021-01-29 — End: 2021-06-21

## 2021-01-29 MED ORDER — HYDROXYZINE HCL 10 MG PO TABS: 10.0000 mg | ORAL_TABLET | Freq: Three times a day (TID) | ORAL | 3 refills | Status: DC | PRN

## 2021-01-29 MED ORDER — LISINOPRIL-HYDROCHLOROTHIAZIDE 20-25 MG PO TABS: 1.0000 | ORAL_TABLET | Freq: Every day | ORAL | 1 refills | Status: DC

## 2021-01-29 MED ORDER — LISDEXAMFETAMINE DIMESYLATE 70 MG PO CAPS
70.0000 mg | ORAL_CAPSULE | Freq: Every day | ORAL | 0 refills | Status: DC
Start: 2021-02-25 — End: 2021-11-02

## 2021-01-29 MED ORDER — LISDEXAMFETAMINE DIMESYLATE 70 MG PO CAPS: 70.0000 mg | ORAL_CAPSULE | Freq: Every day | ORAL | 0 refills | Status: DC

## 2021-01-29 NOTE — Assessment & Plan Note (Signed)
Reviewed MRI results with her today.  I think she needs consultation with Dr. Coletta Memos again.  I think this will likely require surgical intervention or it will progress and get worse.  She already has persistent numbness and weakness in both hands.

## 2021-01-29 NOTE — Assessment & Plan Note (Signed)
Due to recheck lipids. 

## 2021-01-29 NOTE — Progress Notes (Signed)
Established Patient Office Visit  Subjective:  Patient ID: Glenda DillWendy P Rohr, female    DOB: 09/26/1961  Age: 60 y.o. MRN: [id number]  CC:  Chief Complaint  Patient presents with   Numbness   hand numbness    Bilateral worse in her fingers. She is R hand dominant.     HPI Glenda Fernandez presents for numbness and tingling in both hands. It is constant.  She finally had her cervical MRI done over the weekend and she is here today to go over those results.  She still losing balance very easily she drops things.  She does feel like sometimes her neck triggers headaches as well.  ADD - Reports symptoms are well controlled on current regime. Denies any problems with insomnia, chest pain, palpitations, or SOB.    CLINICAL DATA:  Bilateral numbness and tingling in the hands. Loss of balance.  EXAM: MRI CERVICAL SPINE WITHOUT CONTRAST  TECHNIQUE: Multiplanar, multisequence MR imaging of the cervical spine was performed. No intravenous contrast was administered.  COMPARISON:  MRI 11/02/2018.  FINDINGS: Alignment: No malalignment.  Vertebrae: Previous ACDF C3-4.  Cord: Cord compression at the C2-3 level.  See below.  Posterior Fossa, vertebral arteries, paraspinal tissues: Negative  Disc levels:  Foramen magnum is widely patent. Ordinary arthritis at the C1-2 articulation without encroachment upon the neural structures.  C2-3: Severe spinal stenosis due to spondylosis and posterior element hypertrophy. Spinal stenosis with AP diameter of the canal as narrow as 4.5 mm. Effacement of the subarachnoid space and deformity of the cord. Abnormal T2 signal within the cord. Bilateral foraminal stenosis. Findings at this level are likely the cause of clinical presentation. Though not emergent, prompt evaluation is suggested for consideration of decompression.  C3-4: Previous ACDF.  Wide patency of the canal and foramina.  C4-5: Endplate osteophytes and mild bulging of the  disc. Facet fusion on the right. No compressive canal stenosis. Foraminal narrowing on the right could affect the C5 nerve.  C5-6: Spondylosis with endplate osteophytes and bulging of the disc. Mild bilateral uncovertebral prominence. No compressive canal stenosis. Mild bilateral foraminal stenosis.  C6-7: Endplate osteophytes and bulging of the disc more prominent towards the right. No compressive canal stenosis. Foraminal narrowing right more than left. Some potential this could affect the right C7 nerve.  C7-T1: Chronic facet fusion. No disc pathology. Wide patency of the canal and foramina.  IMPRESSION: 1. Previous ACDF C3-4. Wide patency of the canal and foramina at that level. 2. Severe spinal stenosis at the C2-3 level due to spondylosis and posterior element hypertrophy. AP diameter of the canal as narrow as 4.5 mm. Effacement of the subarachnoid space and deformity of the cord. Abnormal T2 signal within the cord. Bilateral foraminal stenosis. Findings at this level are likely the cause of the clinical presentation. Though not emergent, prompt evaluation for consideration decompression suggested. 3. Chronic degenerative changes at C5-6 and C6-7 with mild foraminal stenosis on the right at those levels and some potential to affect the right C5 and C7 nerves. 4. Call report will be performed Monday during all standard office hours.   Electronically Signed   By: Paulina FusiMark  Shogry M.D.   On: 01/28/2021 03:43  Past Medical History:  Diagnosis Date   Arthritis    osteo   Asthma    Depression    GERD (gastroesophageal reflux disease)    Iron deficiency anemia 09/11/2012    Past Surgical History:  Procedure Laterality Date   BACK SURGERY  2001  BLADDER SUSPENSION     BREAST ENHANCEMENT SURGERY Left 1879   CARPAL TUNNEL RELEASE Right 11/01/2016   Procedure: CARPAL TUNNEL RELEASE;  Surgeon: Jodi Geralds, MD;  Location: [facility];  Service:  Orthopedics;  Laterality: Right;   HAND SURGERY Left 2013   for fracture, [place] [facility]   NECK SURGERY     ORIF WRIST FRACTURE Right 11/01/2016   Procedure: OPEN REDUCTION INTERNAL FIXATION (ORIF) WRIST FRACTURE;  Surgeon: Jodi Geralds, MD;  Location: [facility];  Service: Orthopedics;  Laterality: Right;   SHOULDER SURGERY  2005    Family History  Problem Relation Age of Onset   Coronary artery disease Father    Lung cancer Father        mesothelioma   Hyperlipidemia Father    Alcohol abuse Other        uncle   Breast cancer Mother    Kidney failure Mother    Hyperlipidemia Mother    Bipolar disorder Daughter    Depression Daughter     Social History   Socioeconomic History   Marital status: Married    Spouse name: Molly Maduro   Number of children: 2   Years of education: Not on file   Highest education level: Not on file  Occupational History   Occupation: Evidence Tech    Comment: [employer]  Tobacco Use   Smoking status: Current Every Day Smoker    Packs/day: 1.00    Years: 40.00    Pack years: 40.00   Smokeless tobacco: Never Used  Substance and Sexual Activity   Alcohol use: No   Drug use: Not on file   Sexual activity: Not on file  Other Topics Concern   Not on file  Social History Narrative   Some college.  3 caffeine per day.  Walks 3 times per week for exercise.    Social Determinants of Health   Financial Resource Strain: Not on file  Food Insecurity: Not on file  Transportation Needs: Not on file  Physical Activity: Not on file  Stress: Not on file  Social Connections: Not on file  Intimate Partner Violence: Not on file    Outpatient Medications Prior to Visit  Medication Sig Dispense Refill   albuterol (VENTOLIN HFA) 108 (90 Base) MCG/ACT inhaler Inhale 2 puffs into the lungs every 6 (six) hours as needed for wheezing or shortness of breath. 18 g prn   diclofenac sodium (VOLTAREN) 1 % GEL Apply 4 g  topically 4 (four) times daily. To affected joint. 100 g 11   FLUoxetine (PROZAC) 40 MG capsule TAKE 2 CAPSULES (80 MG TOTAL) BY MOUTH DAILY. 180 capsule 1   ipratropium-albuterol (DUONEB) 0.5-2.5 (3) MG/3ML SOLN TAKE 3 MLS BY NEBULIZATION EVERY 4 (FOUR) HOURS AS NEEDED. 360 mL 0   lisdexamfetamine (VYVANSE) 70 MG capsule Take 1 capsule (70 mg total) by mouth daily. 30 capsule 0   lovastatin (MEVACOR) 40 MG tablet TAKE 1 TABLET BY MOUTH EVERYDAY AT BEDTIME 90 tablet 3   omeprazole (PRILOSEC) 40 MG capsule TAKE 1 CAPSULE BY MOUTH EVERY DAY 90 capsule 1   rOPINIRole (REQUIP) 0.5 MG tablet TAKE 1 TABLET BY MOUTH AT BEDTIME. 90 tablet 1   hydrOXYzine (ATARAX/VISTARIL) 10 MG tablet TAKE 1-2 TABLETS (10-20 MG TOTAL) BY MOUTH EVERY 8 (EIGHT) HOURS AS NEEDED FOR ANXIETY. 45 tablet 3   lisdexamfetamine (VYVANSE) 70 MG capsule Take 1 capsule (70 mg total) by mouth daily. 30 capsule 0   lisdexamfetamine (VYVANSE)  70 MG capsule Take 1 capsule (70 mg total) by mouth daily. 30 capsule 0   lisdexamfetamine (VYVANSE) 70 MG capsule Take 1 capsule (70 mg total) by mouth daily. 30 capsule 0   lisinopril-hydrochlorothiazide (ZESTORETIC) 20-25 MG tablet TAKE 1 TABLET BY MOUTH DAILY. 90 tablet 1   No facility-administered medications prior to visit.    No Known Allergies  ROS Review of Systems    Objective:    Physical Exam  BP (!) 128/54    Pulse 93    Ht 5\' 8"  (1.727 m)    Wt 146 lb (66.2 kg)    SpO2 98%    BMI 22.20 kg/m  Wt Readings from Last 3 Encounters:  01/29/21 146 lb (66.2 kg)  11/03/20 145 lb (65.8 kg)  05/29/20 151 lb (68.5 kg)     There are no preventive care reminders to display for this patient.  There are no preventive care reminders to display for this patient.  Lab Results  Component Value Date   TSH 0.55 10/29/2018   Lab Results  Component Value Date   WBC 11.8 (H) 03/15/2020   HGB 11.7 03/15/2020   HCT 34.7 (L) 03/15/2020   MCV 89.4 03/15/2020   PLT 273  03/15/2020   Lab Results  Component Value Date   NA 139 10/05/2019   K 4.3 10/05/2019   CO2 29 10/05/2019   GLUCOSE 93 10/05/2019   BUN 17 10/05/2019   CREATININE 1.03 (H) 10/05/2019   BILITOT 0.3 10/05/2019   ALKPHOS 52 05/20/2017   AST 19 10/05/2019   ALT 16 10/05/2019   PROT 6.6 10/05/2019   ALBUMIN 3.8 05/20/2017   CALCIUM 9.2 10/05/2019   Lab Results  Component Value Date   CHOL 214 (H) 05/20/2017   Lab Results  Component Value Date   HDL 44 (L) 05/20/2017   Lab Results  Component Value Date   LDLCALC 173 (H) 09/05/2016   Lab Results  Component Value Date   TRIG 177 (H) 05/20/2017   Lab Results  Component Value Date   CHOLHDL 4.9 05/20/2017   No results found for: HGBA1C    Assessment & Plan:   Problem List Items Addressed This Visit      Cardiovascular and Mediastinum   Essential hypertension    Well controlled. Continue current regimen. Follow up in  6 mo       Relevant Medications   lisinopril-hydrochlorothiazide (ZESTORETIC) 20-25 MG tablet   Other Relevant Orders   COMPLETE METABOLIC PANEL WITH GFR   Lipid Panel w/reflex Direct LDL     Other   Spinal stenosis in cervical region    Reviewed MRI results with her today.  I think she needs consultation with Dr. 07/20/2017 again.  I think this will likely require surgical intervention or it will progress and get worse.  She already has persistent numbness and weakness in both hands.      Relevant Orders   Ambulatory referral to Neurosurgery   Hyperlipidemia - Primary    Due to recheck lipids.        Relevant Medications   lisinopril-hydrochlorothiazide (ZESTORETIC) 20-25 MG tablet   Other Relevant Orders   COMPLETE METABOLIC PANEL WITH GFR   Lipid Panel w/reflex Direct LDL   ADHD (attention deficit hyperactivity disorder)    Well controlled. Continue current regimen. Follow up in  4 mo.  RF Vyvanse for 3 months.        Relevant Medications   lisdexamfetamine (VYVANSE) 70 MG capsule  (  Start on 03/27/2021)   lisdexamfetamine (VYVANSE) 70 MG capsule (Start on 02/25/2021)   lisdexamfetamine (VYVANSE) 70 MG capsule    Other Visit Diagnoses    Need for immunization against influenza       Relevant Orders   Flu Vaccine QUAD 36+ mos IM (Completed)      Meds ordered this encounter  Medications   hydrOXYzine (ATARAX/VISTARIL) 10 MG tablet    Sig: Take 1-2 tablets (10-20 mg total) by mouth every 8 (eight) hours as needed for anxiety.    Dispense:  45 tablet    Refill:  3   lisdexamfetamine (VYVANSE) 70 MG capsule    Sig: Take 1 capsule (70 mg total) by mouth daily.    Dispense:  30 capsule    Refill:  0   lisdexamfetamine (VYVANSE) 70 MG capsule    Sig: Take 1 capsule (70 mg total) by mouth daily.    Dispense:  30 capsule    Refill:  0   lisdexamfetamine (VYVANSE) 70 MG capsule    Sig: Take 1 capsule (70 mg total) by mouth daily.    Dispense:  30 capsule    Refill:  0   lisinopril-hydrochlorothiazide (ZESTORETIC) 20-25 MG tablet    Sig: Take 1 tablet by mouth daily.    Dispense:  90 tablet    Refill:  1    Follow-up: Return in about 4 months (around 05/29/2021) for  and shingrix.    Nani Gasser, MD

## 2021-01-29 NOTE — Assessment & Plan Note (Signed)
Well controlled. Continue current regimen. Follow up in  6 mo  

## 2021-01-29 NOTE — Assessment & Plan Note (Signed)
Well controlled. Continue current regimen. Follow up in  4 mo.  RF Vyvanse for 3 months.

## 2021-01-30 LAB — LIPID PANEL W/REFLEX DIRECT LDL
Cholesterol: 206 mg/dL — ABNORMAL HIGH (ref <200)
HDL: 41 mg/dL — ABNORMAL LOW (ref >=50)
LDL Cholesterol (Calc): 127 mg/dL (calc) — ABNORMAL HIGH
Non-HDL Cholesterol (Calc): 165 mg/dL (calc) — ABNORMAL HIGH (ref <130)
Total CHOL/HDL Ratio: 5 (calc) — ABNORMAL HIGH (ref <5.0)
Triglycerides: 236 mg/dL — ABNORMAL HIGH (ref <150)

## 2021-01-30 LAB — COMPLETE METABOLIC PANEL WITH GFR
AG Ratio: 1.5 (calc) (ref 1.0–2.5)
ALT: 9 U/L (ref 6–29)
AST: 15 U/L (ref 10–35)
Albumin: 4 g/dL (ref 3.6–5.1)
Alkaline phosphatase (APISO): 78 U/L (ref 37–153)
BUN/Creatinine Ratio: 19 (calc) (ref 6–22)
BUN: 21 mg/dL (ref 7–25)
CO2: 28 mmol/L (ref 20–32)
Calcium: 9 mg/dL (ref 8.6–10.4)
Chloride: 103 mmol/L (ref 98–110)
Creat: 1.12 mg/dL — ABNORMAL HIGH (ref 0.50–0.99)
GFR, Est African American: 61 mL/min/{1.73_m2} (ref >=60)
GFR, Est Non African American: 53 mL/min/{1.73_m2} — ABNORMAL LOW (ref >=60)
Globulin: 2.7 g/dL (calc) (ref 1.9–3.7)
Glucose, Bld: 77 mg/dL (ref 65–99)
Potassium: 4.5 mmol/L (ref 3.5–5.3)
Sodium: 137 mmol/L (ref 135–146)
Total Bilirubin: 0.3 mg/dL (ref 0.2–1.2)
Total Protein: 6.7 g/dL (ref 6.1–8.1)

## 2021-01-30 NOTE — Progress Notes (Signed)
Hi Glenda Fernandez, kidney function went up slightly to 1.1.  It is jumped up like this before so I am not worried but I do want a keep an eye on it and plan to recheck again in 3 months.  Liver enzymes are normal.  Your LDL cholesterol and your glycerides are elevated.  Continue to work on healthy diet and regular exercise.  Are you taking your lovastatin regularly ?  If yes then we may need to adjust your regimen slightly for better control.

## 2021-02-01 ENCOUNTER — Other Ambulatory Visit: Payer: Self-pay | Admitting: *Deleted

## 2021-02-01 DIAGNOSIS — R7989 Other specified abnormal findings of blood chemistry: Secondary | ICD-10-CM

## 2021-02-02 ENCOUNTER — Other Ambulatory Visit: Payer: Self-pay | Admitting: Family Medicine

## 2021-02-02 MED ORDER — ROSUVASTATIN CALCIUM 10 MG PO TABS: 10.0000 mg | ORAL_TABLET | Freq: Every day | ORAL | 3 refills | Status: DC

## 2021-02-04 ENCOUNTER — Other Ambulatory Visit: Payer: Self-pay | Admitting: Family Medicine

## 2021-02-04 DIAGNOSIS — G2581 Restless legs syndrome: Secondary | ICD-10-CM

## 2021-02-08 DIAGNOSIS — Z1231 Encounter for screening mammogram for malignant neoplasm of breast: Secondary | ICD-10-CM

## 2021-02-14 ENCOUNTER — Ambulatory Visit: Payer: Managed Care, Other (non HMO)

## 2021-03-04 ENCOUNTER — Other Ambulatory Visit: Payer: Self-pay | Admitting: Family Medicine

## 2021-03-04 DIAGNOSIS — K2100 Gastro-esophageal reflux disease with esophagitis, without bleeding: Secondary | ICD-10-CM

## 2021-04-22 ENCOUNTER — Encounter: Payer: Self-pay | Admitting: Family Medicine

## 2021-04-22 DIAGNOSIS — F902 Attention-deficit hyperactivity disorder, combined type: Secondary | ICD-10-CM

## 2021-04-23 MED ORDER — LISDEXAMFETAMINE DIMESYLATE 70 MG PO CAPS: 70.0000 mg | ORAL_CAPSULE | Freq: Every day | ORAL | 0 refills | Status: DC

## 2021-04-23 NOTE — Telephone Encounter (Signed)
Med sent. Labs ordered

## 2021-04-27 ENCOUNTER — Encounter: Payer: Self-pay | Admitting: Family Medicine

## 2021-04-27 ENCOUNTER — Telehealth (INDEPENDENT_AMBULATORY_CARE_PROVIDER_SITE_OTHER): Payer: Managed Care, Other (non HMO) | Admitting: Family Medicine

## 2021-04-27 DIAGNOSIS — J329 Chronic sinusitis, unspecified: Secondary | ICD-10-CM | POA: Diagnosis not present

## 2021-04-27 DIAGNOSIS — J40 Bronchitis, not specified as acute or chronic: Secondary | ICD-10-CM

## 2021-04-27 MED ORDER — AMOXICILLIN-POT CLAVULANATE 875-125 MG PO TABS: 1.0000 | ORAL_TABLET | Freq: Two times a day (BID) | ORAL | 0 refills | Status: AC

## 2021-04-27 MED ORDER — PREDNISONE 20 MG PO TABS
40.0000 mg | ORAL_TABLET | Freq: Every day | ORAL | 0 refills | Status: AC
Start: 2021-04-27 — End: 2021-05-02

## 2021-04-27 NOTE — Progress Notes (Signed)
Virtual Video Visit via MyChart Note  I connected with  Glenda Fernandez on 04/27/21 at  9:10 AM EDT by the video enabled telemedicine application for MyChart, and verified that I am speaking with the correct person using two identifiers.   I introduced myself as a Publishing rights manager with the practice. We discussed the limitations of evaluation and management by telemedicine and the availability of in person appointments. The patient expressed understanding and agreed to proceed.  Participating parties in this visit include: The patient and the nurse practitioner listed.  The patient is: At home I am: In the office - Primary Care Glenda Fernandez  Subjective:    CC:  Chief Complaint  Patient presents with  . Cough    HPI: Glenda Fernandez is a 60 y.o. year old female presenting today via MyChart today for cough.  Patient states that for the past 8 days or so, she has had a cold that has progressively worsened and let to a congested cough with frequent wheezing. She reports she initially had some nasal congestion, rhinorrhea, sinus pressure, all of which seems to be slowly improving except for the cough. She states that she is most concerned about the frequent wheezing throughout the day and night, chest tightness, shortness of breath, and congested cough that she feels like she is having trouble breaking up the mucus to clear out. She has been trying OTC cough/cold medicines and her nebulizer with some mild improvement. History of asthma - thinks she has COPD, but never worked up. She denies any chest pain, fevers, dizziness, GI symptoms, loss of tastes/smell. Has not yet tested for COVID.     Past medical history, Surgical history, Family history not pertinant except as noted below, Social history, Allergies, and medications have been entered into the medical record, reviewed, and corrections made.   Review of Systems:  All review of systems negative except what is listed in the HPI   Objective:     General:  Speaking clearly in complete sentences. Mild shortness of breath noted.   Alert and oriented x3.   Normal judgment.  Absent acute distress.   Impression and Recommendations:    1. Sinobronchitis Patient with about 8 days of symptoms with respiratory symptoms that are progressively worsening. Recommend she take a COVID test, especially if she plans to be around anyone the next few days. Given the duration and shortness of breath, wheezing/coughing, will go ahead and with Augmentin and prednisone treatment. Can continue PRN albuterol and supportive measures including mucinex, flonase, hydration, rest, OTC cough/cold/analgesia. If no improvement over the weekend, she will need to be seen in office next week if she has a negative COVID test. Educated on signs and symptoms requiring further evaluation. Patient agreeable to plan.   - amoxicillin-clavulanate (AUGMENTIN) 875-125 MG tablet; Take 1 tablet by mouth 2 (two) times daily for 5 days.  Dispense: 10 tablet; Refill: 0 - predniSONE (DELTASONE) 20 MG tablet; Take 2 tablets (40 mg total) by mouth daily with breakfast for 5 days.  Dispense: 10 tablet; Refill: 0    Follow-up if symptoms worsen or fail to improve.    I discussed the assessment and treatment plan with the patient. The patient was provided an opportunity to ask questions and all were answered. The patient agreed with the plan and demonstrated an understanding of the instructions.   The patient was advised to call back or seek an in-person evaluation if the symptoms worsen or if the condition fails to improve as  anticipated.  I provided 20 minutes of non-face-to-face interaction with this MYCHART visit including intake, same-day documentation, and chart review.   Clayborne Dana, NP

## 2021-05-09 ENCOUNTER — Ambulatory Visit: Payer: Managed Care, Other (non HMO)

## 2021-05-23 ENCOUNTER — Encounter: Payer: Self-pay | Admitting: Family Medicine

## 2021-05-23 DIAGNOSIS — F902 Attention-deficit hyperactivity disorder, combined type: Secondary | ICD-10-CM

## 2021-05-24 MED ORDER — LISDEXAMFETAMINE DIMESYLATE 70 MG PO CAPS: 70.0000 mg | ORAL_CAPSULE | Freq: Every day | ORAL | 0 refills | Status: DC

## 2021-05-24 NOTE — Telephone Encounter (Signed)
Meds ordered this encounter  Medications   lisdexamfetamine (VYVANSE) 70 MG capsule    Sig: Take 1 capsule (70 mg total) by mouth daily.    Dispense:  30 capsule    Refill:  0    

## 2021-05-28 ENCOUNTER — Ambulatory Visit: Payer: Managed Care, Other (non HMO) | Admitting: Family Medicine

## 2021-05-29 ENCOUNTER — Ambulatory Visit: Payer: Managed Care, Other (non HMO) | Admitting: Family Medicine

## 2021-06-01 ENCOUNTER — Ambulatory Visit: Payer: Managed Care, Other (non HMO) | Admitting: Sports Medicine

## 2021-06-01 ENCOUNTER — Other Ambulatory Visit: Payer: Self-pay

## 2021-06-01 ENCOUNTER — Ambulatory Visit (INDEPENDENT_AMBULATORY_CARE_PROVIDER_SITE_OTHER): Payer: Managed Care, Other (non HMO)

## 2021-06-01 DIAGNOSIS — S99921A Unspecified injury of right foot, initial encounter: Secondary | ICD-10-CM

## 2021-06-01 DIAGNOSIS — S92351A Displaced fracture of fifth metatarsal bone, right foot, initial encounter for closed fracture: Secondary | ICD-10-CM | POA: Insufficient documentation

## 2021-06-01 HISTORY — DX: Displaced fracture of fifth metatarsal bone, right foot, initial encounter for closed fracture: S92.351A

## 2021-06-01 MED ORDER — HYDROCODONE-ACETAMINOPHEN 5-325 MG PO TABS: 1.0000 | ORAL_TABLET | Freq: Three times a day (TID) | ORAL | 0 refills | Status: DC | PRN

## 2021-06-01 NOTE — Assessment & Plan Note (Signed)
Glenda Fernandez is a pleasant 60 year old female, she had a fall down the stairs recently, immediate pain, swelling, bruising right foot, tenderness at the tip of the fibula and the base of the fifth metatarsal, we will get ankle and foot x-rays, short course of hydrocodone for pain considering her history, return to see me in 1 to 2 weeks for cast placement.

## 2021-06-01 NOTE — Progress Notes (Signed)
    Procedures performed today:    None.  Independent interpretation of notes and tests performed by another provider:   None.  Brief History, Exam, Impression, and Recommendations:    Injury of foot, right Glenda Fernandez is a pleasant 60 year old female, she had a fall down the stairs recently, immediate pain, swelling, bruising right foot, tenderness at the tip of the fibula and the base of the fifth metatarsal, we will get ankle and foot x-rays, short course of hydrocodone for pain considering her history, return to see me in 1 to 2 weeks for cast placement.    ___________________________________________ [name]. Benjamin Stain, M.D., ABFM., CAQSM. Primary Care and Sports Medicine [facility]  Adjunct Instructor of Family Medicine  [employer]

## 2021-06-08 ENCOUNTER — Ambulatory Visit (INDEPENDENT_AMBULATORY_CARE_PROVIDER_SITE_OTHER): Payer: Managed Care, Other (non HMO) | Admitting: Sports Medicine

## 2021-06-08 ENCOUNTER — Other Ambulatory Visit: Payer: Self-pay

## 2021-06-08 ENCOUNTER — Ambulatory Visit (INDEPENDENT_AMBULATORY_CARE_PROVIDER_SITE_OTHER): Payer: Managed Care, Other (non HMO)

## 2021-06-08 DIAGNOSIS — S99921A Unspecified injury of right foot, initial encounter: Secondary | ICD-10-CM | POA: Diagnosis not present

## 2021-06-08 DIAGNOSIS — S92351A Displaced fracture of fifth metatarsal bone, right foot, initial encounter for closed fracture: Secondary | ICD-10-CM

## 2021-06-08 DIAGNOSIS — S92354D Nondisplaced fracture of fifth metatarsal bone, right foot, subsequent encounter for fracture with routine healing: Secondary | ICD-10-CM

## 2021-06-08 MED ORDER — HYDROCODONE-ACETAMINOPHEN 5-325 MG PO TABS: 1.0000 | ORAL_TABLET | Freq: Two times a day (BID) | ORAL | 0 refills | Status: DC | PRN

## 2021-06-08 NOTE — Assessment & Plan Note (Signed)
Glenda Fernandez returns, she is a very pleasant 60 year old female, she is about a week post a fall down the stairs, she sustained a comminuted fracture of her right fifth metatarsal with a bit of distraction of the fracture fragment proximally. She would like to stay in the boot, her pain is improving considerably. I strapped her foot with a compressive dressing, we will continue the boot, get updated x-rays and refill her hydrocodone . Return to see me in a month.

## 2021-06-08 NOTE — Progress Notes (Signed)
    Procedures performed today:    The right foot was strapped with a compressive dressing  Independent interpretation of notes and tests performed by another provider:   X-rays personally reviewed and show a comminuted minimally distracted fracture of the fifth metatarsal.  Brief History, Exam, Impression, and Recommendations:    Closed fracture of base of fifth metatarsal bone of right foot Glenda Fernandez, she is a very pleasant 60 year old female, she is about a week post a fall down the stairs, she sustained a comminuted fracture of her right fifth metatarsal with a bit of distraction of the fracture fragment proximally. She would like to stay in the boot, her pain is improving considerably. I strapped her foot with a compressive dressing, we will continue the boot, get updated x-rays and refill her hydrocodone . Return to see me in a month.    ___________________________________________ [name]. Benjamin Stain, M.D., ABFM., CAQSM. Primary Care and Sports Medicine [facility]  Adjunct Instructor of Family Medicine  [employer]

## 2021-06-11 ENCOUNTER — Encounter: Payer: Self-pay | Admitting: Family Medicine

## 2021-06-14 NOTE — Telephone Encounter (Signed)
Looks like follow up for numbness/tingling in hands. Is this something that can be seen virtual? Wait until after labs complete?

## 2021-06-14 NOTE — Telephone Encounter (Signed)
Ok for virtual. She prob needs her Vyvanse refilled too and se is due for OV for that

## 2021-06-21 ENCOUNTER — Telehealth (INDEPENDENT_AMBULATORY_CARE_PROVIDER_SITE_OTHER): Payer: Managed Care, Other (non HMO) | Admitting: Family Medicine

## 2021-06-21 ENCOUNTER — Encounter: Payer: Self-pay | Admitting: Family Medicine

## 2021-06-21 DIAGNOSIS — I10 Essential (primary) hypertension: Secondary | ICD-10-CM | POA: Diagnosis not present

## 2021-06-21 DIAGNOSIS — F902 Attention-deficit hyperactivity disorder, combined type: Secondary | ICD-10-CM | POA: Diagnosis not present

## 2021-06-21 DIAGNOSIS — G2581 Restless legs syndrome: Secondary | ICD-10-CM | POA: Diagnosis not present

## 2021-06-21 DIAGNOSIS — F330 Major depressive disorder, recurrent, mild: Secondary | ICD-10-CM | POA: Diagnosis not present

## 2021-06-21 MED ORDER — LISDEXAMFETAMINE DIMESYLATE 70 MG PO CAPS: 70.0000 mg | ORAL_CAPSULE | Freq: Every day | ORAL | 0 refills | Status: DC

## 2021-06-21 MED ORDER — ROPINIROLE HCL 0.5 MG PO TABS: 0.5000 mg | ORAL_TABLET | Freq: Every day | ORAL | 1 refills | Status: DC

## 2021-06-21 MED ORDER — FLUOXETINE HCL 40 MG PO CAPS: 80.0000 mg | ORAL_CAPSULE | Freq: Every day | ORAL | 1 refills | Status: DC

## 2021-06-21 MED ORDER — LISINOPRIL-HYDROCHLOROTHIAZIDE 20-25 MG PO TABS: 1.0000 | ORAL_TABLET | Freq: Every day | ORAL | 1 refills | Status: DC

## 2021-06-21 NOTE — Assessment & Plan Note (Addendum)
occ has to take an extra tab during the daytime if sitting around.  Will refill meds and plan to recheck iron level. Low iron can flare symptoms.

## 2021-06-21 NOTE — Assessment & Plan Note (Signed)
Reminded her to go for BMP at the end of the month.

## 2021-06-21 NOTE — Addendum Note (Signed)
Addended by: Nani Gasser D on: 06/21/2021 03:14 PM   Modules accepted: Orders

## 2021-06-21 NOTE — Progress Notes (Signed)
Pt would like to increase the Requip she stated that she sometimes has to take a tablet during the daytime. She will need this refilled with increased strength or quantity

## 2021-06-21 NOTE — Assessment & Plan Note (Signed)
Well controlled. Continue current regimen. Follow up in  3-4 mo  

## 2021-06-21 NOTE — Progress Notes (Signed)
Virtual Visit via Video Note  I connected with Glenda Fernandez on 06/21/21 at  2:00 PM EDT by a video enabled telemedicine application and verified that I am speaking with the correct person using two identifiers.   I discussed the limitations of evaluation and management by telemedicine and the availability of in person appointments. The patient expressed understanding and agreed to proceed.  Patient location: at home Provider location: in office  Subjective:    CC: F/U meds  HPI:  Unfortunately she fractured her foot since I last saw her.  She is currently in a boot and might actually end up needing surgery she has a follow-up with the sports med doc at the end of the month.  ADHD - Reports symptoms are well controlled on current regime. Denies any problems with insomnia, chest pain, palpitations, or SOB.    Hypertension- Pt denies chest pain, SOB, dizziness, or heart palpitations.  Taking meds as directed w/o problems.  Denies medication side effects.    F/U RLS - she is doing well. Occ takes a tab during the daytime.    Past medical history, Surgical history, Family history not pertinant except as noted below, Social history, Allergies, and medications have been entered into the medical record, reviewed, and corrections made.    Objective:    General: Speaking clearly in complete sentences without any shortness of breath.  Alert and oriented x3.  Normal judgment. No apparent acute distress.    Impression and Recommendations:    RLS (restless legs syndrome) occ has to take an extra tab during the daytime if sitting around.  Will refill meds and plan to recheck iron level. Low iron can flare symptoms.   ADHD (attention deficit hyperactivity disorder) Well controlled. Continue current regimen. Follow up in  3-4 mo   Essential hypertension Reminded her to go for BMP at the end of the month.   Orders Placed This Encounter  Procedures   Fe+TIBC+Fer   BASIC METABOLIC PANEL  WITH GFR     Meds ordered this encounter  Medications   FLUoxetine (PROZAC) 40 MG capsule    Sig: Take 2 capsules (80 mg total) by mouth daily.    Dispense:  180 capsule    Refill:  1   lisdexamfetamine (VYVANSE) 70 MG capsule    Sig: Take 1 capsule (70 mg total) by mouth daily.    Dispense:  30 capsule    Refill:  0   lisdexamfetamine (VYVANSE) 70 MG capsule    Sig: Take 1 capsule (70 mg total) by mouth daily.    Dispense:  30 capsule    Refill:  0   lisdexamfetamine (VYVANSE) 70 MG capsule    Sig: Take 1 capsule (70 mg total) by mouth daily.    Dispense:  30 capsule    Refill:  0   rOPINIRole (REQUIP) 0.5 MG tablet    Sig: Take 1 tablet (0.5 mg total) by mouth at bedtime.    Dispense:  90 tablet    Refill:  1   lisinopril-hydrochlorothiazide (ZESTORETIC) 20-25 MG tablet    Sig: Take 1 tablet by mouth daily.    Dispense:  90 tablet    Refill:  1      I discussed the assessment and treatment plan with the patient. The patient was provided an opportunity to ask questions and all were answered. The patient agreed with the plan and demonstrated an understanding of the instructions.   The patient was advised to call back or  seek an in-person evaluation if the symptoms worsen or if the condition fails to improve as anticipated.   Nani Gasser, MD

## 2021-06-28 ENCOUNTER — Other Ambulatory Visit: Payer: Self-pay | Admitting: Family Medicine

## 2021-07-06 ENCOUNTER — Ambulatory Visit (INDEPENDENT_AMBULATORY_CARE_PROVIDER_SITE_OTHER): Payer: Managed Care, Other (non HMO)

## 2021-07-06 ENCOUNTER — Ambulatory Visit (INDEPENDENT_AMBULATORY_CARE_PROVIDER_SITE_OTHER): Payer: Managed Care, Other (non HMO) | Admitting: Sports Medicine

## 2021-07-06 ENCOUNTER — Other Ambulatory Visit: Payer: Self-pay

## 2021-07-06 DIAGNOSIS — S92351A Displaced fracture of fifth metatarsal bone, right foot, initial encounter for closed fracture: Secondary | ICD-10-CM | POA: Diagnosis not present

## 2021-07-06 DIAGNOSIS — S92354D Nondisplaced fracture of fifth metatarsal bone, right foot, subsequent encounter for fracture with routine healing: Secondary | ICD-10-CM | POA: Diagnosis not present

## 2021-07-06 NOTE — Assessment & Plan Note (Signed)
Glenda Fernandez returns, she is now about 5 weeks post slightly distracted fracture of her fifth metatarsal, she has been doing really well in a boot, minimal walking, on exam she really does not have any tenderness over the fracture anymore, we are going to keep her in the boot for 2 more weeks, get some x-rays today, after 2 weeks she can transition into a rigid soled shoe. Return to see me in 4 weeks.

## 2021-07-06 NOTE — Progress Notes (Signed)
    Procedures performed today:    The foot was strapped with a compressive dressing.  Independent interpretation of notes and tests performed by another provider:   None.  Brief History, Exam, Impression, and Recommendations:    Closed fracture of base of fifth metatarsal bone of right foot Glenda Fernandez returns, she is now about 5 weeks post slightly distracted fracture of her fifth metatarsal, she has been doing really well in a boot, minimal walking, on exam she really does not have any tenderness over the fracture anymore, we are going to keep her in the boot for 2 more weeks, get some x-rays today, after 2 weeks she can transition into a rigid soled shoe. Return to see me in 4 weeks.    ___________________________________________ [name]. Benjamin Stain, M.D., ABFM., CAQSM. Primary Care and Sports Medicine [facility]  Adjunct Instructor of Family Medicine  [employer]

## 2021-07-07 LAB — IRON,TIBC AND FERRITIN PANEL
%SAT: 13 % (calc) — ABNORMAL LOW (ref 16–45)
Ferritin: 12 ng/mL — ABNORMAL LOW (ref 16–288)
Iron: 38 ug/dL — ABNORMAL LOW (ref 45–160)
TIBC: 283 mcg/dL (calc) (ref 250–450)

## 2021-07-07 LAB — BASIC METABOLIC PANEL WITH GFR
BUN: 13 mg/dL (ref 7–25)
CO2: 26 mmol/L (ref 20–32)
Calcium: 9.3 mg/dL (ref 8.6–10.4)
Chloride: 107 mmol/L (ref 98–110)
Creat: 1.02 mg/dL (ref 0.50–1.05)
Glucose, Bld: 75 mg/dL (ref 65–99)
Potassium: 4.1 mmol/L (ref 3.5–5.3)
Sodium: 141 mmol/L (ref 135–146)
eGFR: 62 mL/min/{1.73_m2} (ref >=60)

## 2021-08-03 ENCOUNTER — Ambulatory Visit: Payer: Managed Care, Other (non HMO) | Admitting: Sports Medicine

## 2021-08-23 ENCOUNTER — Other Ambulatory Visit: Payer: Self-pay | Admitting: Family Medicine

## 2021-08-23 DIAGNOSIS — K2100 Gastro-esophageal reflux disease with esophagitis, without bleeding: Secondary | ICD-10-CM

## 2021-09-25 ENCOUNTER — Other Ambulatory Visit: Payer: Self-pay | Admitting: Family Medicine

## 2021-10-04 ENCOUNTER — Other Ambulatory Visit: Payer: Self-pay | Admitting: Family Medicine

## 2021-10-04 DIAGNOSIS — F902 Attention-deficit hyperactivity disorder, combined type: Secondary | ICD-10-CM

## 2021-10-05 MED ORDER — LISDEXAMFETAMINE DIMESYLATE 70 MG PO CAPS: 70.0000 mg | ORAL_CAPSULE | Freq: Every day | ORAL | 0 refills | Status: DC

## 2021-10-06 ENCOUNTER — Other Ambulatory Visit: Payer: Self-pay | Admitting: Family Medicine

## 2021-10-06 DIAGNOSIS — F330 Major depressive disorder, recurrent, mild: Secondary | ICD-10-CM

## 2021-10-22 ENCOUNTER — Ambulatory Visit: Payer: Self-pay | Admitting: Family Medicine

## 2021-10-22 DIAGNOSIS — Z91199 Patient's noncompliance with other medical treatment and regimen due to unspecified reason: Secondary | ICD-10-CM

## 2021-10-22 NOTE — Progress Notes (Signed)
No show

## 2021-11-02 ENCOUNTER — Encounter: Payer: Self-pay | Admitting: Family Medicine

## 2021-11-02 ENCOUNTER — Telehealth: Payer: Managed Care, Other (non HMO) | Admitting: Family Medicine

## 2021-11-02 VITALS — BP 107/73 | HR 77 | Temp 97.4°F | Ht 68.0 in | Wt 145.0 lb

## 2021-11-02 DIAGNOSIS — J4521 Mild intermittent asthma with (acute) exacerbation: Secondary | ICD-10-CM

## 2021-11-02 DIAGNOSIS — D509 Iron deficiency anemia, unspecified: Secondary | ICD-10-CM

## 2021-11-02 DIAGNOSIS — F902 Attention-deficit hyperactivity disorder, combined type: Secondary | ICD-10-CM

## 2021-11-02 DIAGNOSIS — Z1231 Encounter for screening mammogram for malignant neoplasm of breast: Secondary | ICD-10-CM

## 2021-11-02 MED ORDER — LISDEXAMFETAMINE DIMESYLATE 70 MG PO CAPS: 70.0000 mg | ORAL_CAPSULE | Freq: Every day | ORAL | 0 refills | Status: DC

## 2021-11-02 NOTE — Assessment & Plan Note (Signed)
Stable. Just uses albuterol PRN.

## 2021-11-02 NOTE — Patient Instructions (Signed)
Plan to follow up in 3 months for labs. Recheck iron

## 2021-11-02 NOTE — Progress Notes (Signed)
Virtual Visit via Video Note  I connected with Glenda Fernandez on 11/02/21 at  1:00 PM EST by a video enabled telemedicine application and verified that I am speaking with the correct person using two identifiers.   I discussed the limitations of evaluation and management by telemedicine and the availability of in person appointments. The patient expressed understanding and agreed to proceed.  Patient location: at home Provider location: in office  Subjective:    CC:   Chief Complaint  Patient presents with   ADHD    HPI:  ADHD - Reports symptoms are well controlled on current regime. Denies any problems with insomnia, chest pain, palpitations, or SOB.    She is taking her iron and feeling better.   Asthma - she is doing well.  Used her albuterol  a couple of weeks ago when she had a cold.  She hasn't needed her neb machine.   Has more reliable transportation now and would like to schedule her mammo   Past medical history, Surgical history, Family history not pertinant except as noted below, Social history, Allergies, and medications have been entered into the medical record, reviewed, and corrections made.    Objective:    General: Speaking clearly in complete sentences without any shortness of breath.  Alert and oriented x3.  Normal judgment. No apparent acute distress.   Impression and Recommendations:    Problem List Items Addressed This Visit       Respiratory   Mild intermittent asthma with acute exacerbation    Stable. Just uses albuterol PRN.          Other   Iron deficiency anemia    She is taking her iron.  Leg cramps and creep crawling.       Relevant Medications   Ferrous Sulfate Dried (HIGH POTENCY IRON) 65 MG TABS   ADHD (attention deficit hyperactivity disorder)    Well controlled. Continue current regimen. Follow up in  6 mo       Relevant Medications   lisdexamfetamine (VYVANSE) 70 MG capsule (Start on 12/31/2021)   lisdexamfetamine  (VYVANSE) 70 MG capsule (Start on 12/01/2021)   lisdexamfetamine (VYVANSE) 70 MG capsule   Other Visit Diagnoses     Screening mammogram for breast cancer    -  Primary   Relevant Orders   MM DIGITAL SCREENING W/ IMPLANTS BILATERAL       Orders Placed This Encounter  Procedures   MM DIGITAL SCREENING W/ IMPLANTS BILATERAL    Standing Status:   Future    Standing Expiration Date:   11/02/2022    Order Specific Question:   Reason for Exam (SYMPTOM  OR DIAGNOSIS REQUIRED)    Answer:   screen mammog    Order Specific Question:   Preferred imaging location?    Answer:   [name]     Meds ordered this encounter  Medications   lisdexamfetamine (VYVANSE) 70 MG capsule    Sig: Take 1 capsule (70 mg total) by mouth daily.    Dispense:  30 capsule    Refill:  0   lisdexamfetamine (VYVANSE) 70 MG capsule    Sig: Take 1 capsule (70 mg total) by mouth daily.    Dispense:  30 capsule    Refill:  0   lisdexamfetamine (VYVANSE) 70 MG capsule    Sig: Take 1 capsule (70 mg total) by mouth daily.    Dispense:  30 capsule    Refill:  0  I discussed the assessment and treatment plan with the patient. The patient was provided an opportunity to ask questions and all were answered. The patient agreed with the plan and demonstrated an understanding of the instructions.   The patient was advised to call back or seek an in-person evaluation if the symptoms worsen or if the condition fails to improve as anticipated.   [name], MD

## 2021-11-02 NOTE — Assessment & Plan Note (Signed)
Well controlled. Continue current regimen. Follow up in  6 mo  

## 2021-11-02 NOTE — Progress Notes (Signed)
Pt reports that she had started taking the Iron and the leg cramping has gotten better since she started taking this.

## 2021-11-02 NOTE — Assessment & Plan Note (Signed)
She is taking her iron.  Leg cramps and creep crawling.

## 2021-11-22 ENCOUNTER — Other Ambulatory Visit: Payer: Self-pay | Admitting: Family Medicine

## 2021-11-22 ENCOUNTER — Telehealth: Payer: Self-pay | Admitting: Family Medicine

## 2021-11-22 DIAGNOSIS — R059 Cough, unspecified: Secondary | ICD-10-CM

## 2021-11-22 NOTE — Telephone Encounter (Signed)
Please call patient and remind her to get her mammogram scheduled we discussed that at her last video visit I really want her to get that done as its been years since her last 1.  Also see if she would be interested in Cologuard as an option for colon cancer screening.

## 2021-11-22 NOTE — Telephone Encounter (Signed)
Spoke with pt who stated that she will schedule her mammogram after the holidays.  She also states that she will consider doing the Cologuard.  Advised pt to inform us if she does want to proceed with Cologuard so that we may place the order.  Pt expressed understanding and is agreeable.  Tiajuana Amass, CMA

## 2021-11-28 ENCOUNTER — Telehealth: Payer: Self-pay | Admitting: General Practice

## 2021-11-28 ENCOUNTER — Telehealth: Payer: Self-pay | Admitting: *Deleted

## 2021-11-28 NOTE — Telephone Encounter (Signed)
Transition Care Management Unsuccessful Follow-up Telephone Call  Date of discharge and from where:  11/28/21 from [facility]  Attempts:  1st Attempt  Reason for unsuccessful TCM follow-up call:  Left voice message patient needs an appointment in 14 days to have her stitches removed. She was discharged 11/28/21.

## 2021-11-28 NOTE — Telephone Encounter (Signed)
From an anti-inflammatory and Tylenol she can alternate them every 4 hours if 100 mg of ibuprofen and 4 hours 1000 mg of Tylenol.  Recommend ice to the area.

## 2021-11-28 NOTE — Telephone Encounter (Signed)
Pt's daughter advised of recommendations. She was upset because her mother is in so much pain and she wasn't given any pain medication prior to leaving the ED. I told her that I'm unsure as to why they didn't but told her that if the pain gets out of control to take her to either UC or the ED she voiced understanding.

## 2021-11-28 NOTE — Telephone Encounter (Signed)
Pt's daughter called and stated that her mother fell and was seen at [facility] ED. They did not give her any pain medication when she was discharged. Pt is in a lot of pain and has taken Tylenol and Motrin neither have helped.  Asked if something can be sent to her pharmacy to help with her pain. Pharmacy was confirmed .

## 2021-11-30 ENCOUNTER — Encounter: Payer: Self-pay | Admitting: Sports Medicine

## 2021-11-30 NOTE — Telephone Encounter (Signed)
Transition Care Management Follow-up Telephone Call Date of discharge and from where: 11/28/21 from [facility] How have you been since you were released from the hospital? Litter better but still in a lot of pain. Any questions or concerns? No  Items Reviewed: Did the pt receive and understand the discharge instructions provided? Yes  Medications obtained and verified? No  Other? No  Any new allergies since your discharge? No  Dietary orders reviewed? Yes Do you have support at home? Yes   Home Care and Equipment/Supplies: Were home health services ordered? no  Functional Questionnaire: (I = Independent and D = Dependent) ADLs: I  Bathing/Dressing- I  Meal Prep- I  Eating- I  Maintaining continence- I  Transferring/Ambulation- I  Managing Meds- I  Follow up appointments reviewed:  PCP Hospital f/u appt confirmed? Yes  Scheduled to see Dr. Linford Arnold on 12/12/21 @ 1320. Specialist Hospital f/u appt confirmed? No   Are transportation arrangements needed? No  If their condition worsens, is the pt aware to call PCP or go to the Emergency Dept.? Yes Was the patient provided with contact information for the PCP's office or ED? Yes Was to pt encouraged to call back with questions or concerns? Yes

## 2021-12-03 ENCOUNTER — Other Ambulatory Visit: Payer: Self-pay

## 2021-12-03 ENCOUNTER — Ambulatory Visit: Payer: Managed Care, Other (non HMO) | Admitting: Family Medicine

## 2021-12-03 ENCOUNTER — Encounter: Payer: Self-pay | Admitting: Family Medicine

## 2021-12-03 VITALS — BP 106/50 | HR 100 | Temp 98.3°F

## 2021-12-03 DIAGNOSIS — R509 Fever, unspecified: Secondary | ICD-10-CM

## 2021-12-03 DIAGNOSIS — S81002A Unspecified open wound, left knee, initial encounter: Secondary | ICD-10-CM

## 2021-12-03 MED ORDER — OXYCODONE-ACETAMINOPHEN 10-325 MG PO TABS: 0.5000 | ORAL_TABLET | Freq: Four times a day (QID) | ORAL | 0 refills | Status: DC | PRN

## 2021-12-03 MED ORDER — SULFAMETHOXAZOLE-TRIMETHOPRIM 800-160 MG PO TABS: 1.0000 | ORAL_TABLET | Freq: Two times a day (BID) | ORAL | 0 refills | Status: DC

## 2021-12-03 NOTE — Progress Notes (Signed)
Acute Office Visit  Subjective:    Patient ID: Glenda Fernandez, female    DOB: [birth year], 60 y.o.   MRN: [id number]  Chief Complaint  Patient presents with   Knee injury    Patient complains of knee pain and fever. Patient states she started running a fever around 102 on 11/29/21    HPI Patient is in today for follow-up of knee laceration after a fall that occurred on December 13.  She did go to the emergency department and they placed sutures.  She said she was not given any pain medicine or antibiotics at that time.  He did have an x-ray of the thigh and femur that showed no fracture.  X-ray demonstrated some subcutaneous gas and possible intra-articular gas.  CT of the knee though was negative.  Placed in a knee immobilizer and told to take Motrin when she got home.  Pain has been quite intense and she has not been able to get good pain control she took some old Vicodin that she had leftover.  He has not removed the dressing since it was placed.  Past Medical History:  Diagnosis Date   Arthritis    osteo   Asthma    Depression    GERD (gastroesophageal reflux disease)    Iron deficiency anemia 09/11/2012    Past Surgical History:  Procedure Laterality Date   BACK SURGERY  2001   BLADDER SUSPENSION     BREAST ENHANCEMENT SURGERY Left 1879   CARPAL TUNNEL RELEASE Right 11/01/2016   Procedure: CARPAL TUNNEL RELEASE;  Surgeon: Dorna Leitz, MD;  Location: [place];  Service: Orthopedics;  Laterality: Right;   HAND SURGERY Left 2013   for fracture, [place] [facility]   NECK SURGERY     ORIF WRIST FRACTURE Right 11/01/2016   Procedure: OPEN REDUCTION INTERNAL FIXATION (ORIF) WRIST FRACTURE;  Surgeon: Dorna Leitz, MD;  Location: [place];  Service: Orthopedics;  Laterality: Right;   SHOULDER SURGERY  2005    Family History  Problem Relation Age of Onset   Coronary artery disease Father    Lung cancer Father        mesothelioma   Hyperlipidemia Father     Alcohol abuse Other        uncle   Breast cancer Mother    Kidney failure Mother    Hyperlipidemia Mother    Bipolar disorder Daughter    Depression Daughter     Social History   Socioeconomic History   Marital status: Married    Spouse name: Herbie [place]   Number of children: 2   Years of education: Not on file   Highest education level: Not on file  Occupational History   Occupation: Evidence Tech    Comment: [employer] [place]  Tobacco Use   Smoking status: Every Day    Packs/day: 1.00    Years: 40.00    Pack years: 40.00    Types: Cigarettes   Smokeless tobacco: Never  Substance and Sexual Activity   Alcohol use: No   Drug use: Not on file   Sexual activity: Not on file  Other Topics Concern   Not on file  Social History Narrative   Some college.  3 caffeine per day.  Walks 3 times per week for exercise.    Social Determinants of Health   Financial Resource Strain: Not on file  Food Insecurity: Not on file  Transportation Needs: Not on file  Physical Activity: Not on file  Stress: Not on file  Social Connections: Not on file  Intimate Partner Violence: Not on file    Outpatient Medications Prior to Visit  Medication Sig Dispense Refill   albuterol (VENTOLIN HFA) 108 (90 Base) MCG/ACT inhaler USE 2 PUFFS INTO LUNGS EVERY 6 HOURS AS NEEDED FOR WHEEZE OR FOR SHORTNESS OF BREATH 8.5 each 99   Ferrous Sulfate Dried (HIGH POTENCY IRON) 65 MG TABS Take 1 tablet by mouth daily.     FLUoxetine (PROZAC) 40 MG capsule TAKE 2 CAPSULES BY MOUTH DAILY 180 capsule 1   hydrOXYzine (ATARAX) 10 MG tablet TAKE 1 TO 2 TABLETS BY MOUTH EVERY 8 HOURS AS NEEDED FOR ANXIETY 45 tablet 1   ipratropium-albuterol (DUONEB) 0.5-2.5 (3) MG/3ML SOLN TAKE 3 MLS BY NEBULIZATION EVERY 4 (FOUR) HOURS AS NEEDED. 360 mL 0   [START ON 12/31/2021] lisdexamfetamine (VYVANSE) 70 MG capsule Take 1 capsule (70 mg total) by mouth daily. 30 capsule 0   lisdexamfetamine (VYVANSE) 70 MG capsule Take 1 capsule (70  mg total) by mouth daily. 30 capsule 0   lisdexamfetamine (VYVANSE) 70 MG capsule Take 1 capsule (70 mg total) by mouth daily. 30 capsule 0   lisinopril-hydrochlorothiazide (ZESTORETIC) 20-25 MG tablet Take 1 tablet by mouth daily. 90 tablet 1   omeprazole (PRILOSEC) 40 MG capsule TAKE 1 CAPSULE BY MOUTH EVERY DAY 90 capsule 1   rOPINIRole (REQUIP) 0.5 MG tablet Take 1 tablet (0.5 mg total) by mouth at bedtime. 90 tablet 1   rosuvastatin (CRESTOR) 10 MG tablet Take 1 tablet (10 mg total) by mouth at bedtime. 90 tablet 3   No facility-administered medications prior to visit.    No Known Allergies  Review of Systems     Objective:    Physical Exam Upon unwrapping the wound it had a very foul odor and a lot of drainage. Significant erythema around the wound which is not surprising but it extends all the way down to her shin.  I did draw around the margins with a sharpie after we took the photograph.  The drainage is mostly serous.     BP (!) 106/50    Pulse 100    Temp 98.3 F (36.8 C)    SpO2 100%  Wt Readings from Last 3 Encounters:  11/02/21 145 lb (65.8 kg)  01/29/21 146 lb (66.2 kg)  11/03/20 145 lb (65.8 kg)    Health Maintenance Due  Topic Date Due   COVID-19 Vaccine (1) Never done   COLONOSCOPY (Pts 45-108yr Insurance coverage will need to be confirmed)  Never done   PAP SMEAR-Modifier  07/14/2018   MAMMOGRAM  09/29/2021    There are no preventive care reminders to display for this patient.   Lab Results  Component Value Date   TSH 0.55 10/29/2018   Lab Results  Component Value Date   WBC 11.8 (H) 03/15/2020   HGB 11.7 03/15/2020   HCT 34.7 (L) 03/15/2020   MCV 89.4 03/15/2020   PLT 273 03/15/2020   Lab Results  Component Value Date   NA 141 07/06/2021   K 4.1 07/06/2021   CO2 26 07/06/2021   GLUCOSE 75 07/06/2021   BUN 13 07/06/2021   CREATININE 1.02 07/06/2021   BILITOT 0.3 01/29/2021   ALKPHOS 52 05/20/2017   AST 15 01/29/2021   ALT 9  01/29/2021   PROT 6.7 01/29/2021   ALBUMIN 3.8 05/20/2017   CALCIUM 9.3 07/06/2021   EGFR 62 07/06/2021   Lab Results  Component Value Date  CHOL 206 (H) 01/29/2021   Lab Results  Component Value Date   HDL 41 (L) 01/29/2021   Lab Results  Component Value Date   LDLCALC 127 (H) 01/29/2021   Lab Results  Component Value Date   TRIG 236 (H) 01/29/2021   Lab Results  Component Value Date   CHOLHDL 5.0 (H) 01/29/2021   No results found for: HGBA1C     Assessment & Plan:   Problem List Items Addressed This Visit   None Visit Diagnoses     Open wound of left knee, initial encounter    -  Primary   Relevant Orders   CBC with Differential/Platelet   Fever, unspecified fever cause       Relevant Orders   CBC with Differential/Platelet      Wound appears to be infected based on erythema streaking down her shin.  We will go ahead and treat with Bactrim DS to cover for staph and MRSA she has had a prior history of MRSA.  I like to see her back in 48 hours to make sure that she is improving she did feel like she had a little low-grade temp and was running a fever at home though afebrile here today.  Vitals are stable but we will get a CBC with differential to see if her white cells are elevated.  She does still he has serous drainage on exam.  Pain control provided by hydrocodone 10/325.    Meds ordered this encounter  Medications   oxyCODONE-acetaminophen (PERCOCET) 10-325 MG tablet    Sig: Take 0.5-1 tablets by mouth every 6 (six) hours as needed for pain.    Dispense:  20 tablet    Refill:  0   sulfamethoxazole-trimethoprim (BACTRIM DS) 800-160 MG tablet    Sig: Take 1 tablet by mouth 2 (two) times daily.    Dispense:  20 tablet    Refill:  0     [name], MD

## 2021-12-04 LAB — CBC WITH DIFFERENTIAL/PLATELET
Absolute Monocytes: 1431 cells/uL — ABNORMAL HIGH (ref 200–950)
Basophils Absolute: 102 cells/uL (ref 0–200)
Basophils Relative: 0.7 %
Eosinophils Absolute: 234 cells/uL (ref 15–500)
Eosinophils Relative: 1.6 %
HCT: 35.2 % (ref 35.0–45.0)
Hemoglobin: 12 g/dL (ref 11.7–15.5)
Lymphs Abs: 2657 cells/uL (ref 850–3900)
MCH: 30.3 pg (ref 27.0–33.0)
MCHC: 34.1 g/dL (ref 32.0–36.0)
MCV: 88.9 fL (ref 80.0–100.0)
MPV: 10.8 fL (ref 7.5–12.5)
Monocytes Relative: 9.8 %
Neutro Abs: 10176 cells/uL — ABNORMAL HIGH (ref 1500–7800)
Neutrophils Relative %: 69.7 %
Platelets: 332 10*3/uL (ref 140–400)
RBC: 3.96 10*6/uL (ref 3.80–5.10)
RDW: 13.2 % (ref 11.0–15.0)
Total Lymphocyte: 18.2 %
WBC: 14.6 10*3/uL — ABNORMAL HIGH (ref 3.8–10.8)

## 2021-12-04 NOTE — Progress Notes (Signed)
Hi Glenda Fernandez, your White blood cells are elevated.  So definitely please keep an eye on the fever if its not improving in the next couple of days then please let me know.  Hopefully you were able to start her antibiotics yesterday afternoon.

## 2021-12-05 ENCOUNTER — Ambulatory Visit: Payer: Managed Care, Other (non HMO) | Admitting: Family Medicine

## 2021-12-13 ENCOUNTER — Encounter: Payer: Self-pay | Admitting: Family Medicine

## 2021-12-13 ENCOUNTER — Ambulatory Visit (INDEPENDENT_AMBULATORY_CARE_PROVIDER_SITE_OTHER): Payer: Managed Care, Other (non HMO) | Admitting: Family Medicine

## 2021-12-13 ENCOUNTER — Other Ambulatory Visit: Payer: Self-pay

## 2021-12-13 VITALS — BP 129/37 | HR 103 | Temp 98.6°F | Resp 18 | Wt 145.0 lb

## 2021-12-13 DIAGNOSIS — S81002A Unspecified open wound, left knee, initial encounter: Secondary | ICD-10-CM | POA: Diagnosis not present

## 2021-12-13 MED ORDER — DOXYCYCLINE HYCLATE 100 MG PO TABS: 100.0000 mg | ORAL_TABLET | Freq: Two times a day (BID) | ORAL | 0 refills | Status: DC

## 2021-12-13 NOTE — Progress Notes (Signed)
Established Patient Office Visit  Subjective:  Patient ID: Glenda Fernandez, female    DOB: May 09, 1961  Age: 60 y.o. MRN: [id number]  CC: No chief complaint on file.   HPI Glenda Fernandez presents for f/u wound of left knee.  Says completed her ABX last night.  Didn't follow up from previous visit.  Has been using non -stick gauze and wearing brace and coban on top. Also using vaseline. Cleaning with sterile water.  She came in for suture removal.    Past Medical History:  Diagnosis Date   Arthritis    osteo   Asthma    Depression    GERD (gastroesophageal reflux disease)    Iron deficiency anemia 09/11/2012    Past Surgical History:  Procedure Laterality Date   BACK SURGERY  2001   BLADDER SUSPENSION     BREAST ENHANCEMENT SURGERY Left 1879   CARPAL TUNNEL RELEASE Right 11/01/2016   Procedure: CARPAL TUNNEL RELEASE;  Surgeon: Dorna Leitz, MD;  Location: [place];  Service: Orthopedics;  Laterality: Right;   HAND SURGERY Left 2013   for fracture, [place] [facility]   NECK SURGERY     ORIF WRIST FRACTURE Right 11/01/2016   Procedure: OPEN REDUCTION INTERNAL FIXATION (ORIF) WRIST FRACTURE;  Surgeon: Dorna Leitz, MD;  Location: [place];  Service: Orthopedics;  Laterality: Right;   SHOULDER SURGERY  2005    Family History  Problem Relation Age of Onset   Coronary artery disease Father    Lung cancer Father        mesothelioma   Hyperlipidemia Father    Alcohol abuse Other        uncle   Breast cancer Mother    Kidney failure Mother    Hyperlipidemia Mother    Bipolar disorder Daughter    Depression Daughter     Social History   Socioeconomic History   Marital status: Married    Spouse name: Herbie [place]   Number of children: 2   Years of education: Not on file   Highest education level: Not on file  Occupational History   Occupation: Evidence Tech    Comment: [employer] [place]  Tobacco Use   Smoking status: Every Day    Packs/day: 1.00     Years: 40.00    Pack years: 40.00    Types: Cigarettes   Smokeless tobacco: Never  Substance and Sexual Activity   Alcohol use: No   Drug use: Not on file   Sexual activity: Not on file  Other Topics Concern   Not on file  Social History Narrative   Some college.  3 caffeine per day.  Walks 3 times per week for exercise.    Social Determinants of Health   Financial Resource Strain: Not on file  Food Insecurity: Not on file  Transportation Needs: Not on file  Physical Activity: Not on file  Stress: Not on file  Social Connections: Not on file  Intimate Partner Violence: Not on file    Outpatient Medications Prior to Visit  Medication Sig Dispense Refill   albuterol (VENTOLIN HFA) 108 (90 Base) MCG/ACT inhaler USE 2 PUFFS INTO LUNGS EVERY 6 HOURS AS NEEDED FOR WHEEZE OR FOR SHORTNESS OF BREATH 8.5 each 99   Ferrous Sulfate Dried (HIGH POTENCY IRON) 65 MG TABS Take 1 tablet by mouth daily.     FLUoxetine (PROZAC) 40 MG capsule TAKE 2 CAPSULES BY MOUTH DAILY 180 capsule 1   hydrOXYzine (ATARAX) 10 MG  tablet TAKE 1 TO 2 TABLETS BY MOUTH EVERY 8 HOURS AS NEEDED FOR ANXIETY 45 tablet 1   ipratropium-albuterol (DUONEB) 0.5-2.5 (3) MG/3ML SOLN TAKE 3 MLS BY NEBULIZATION EVERY 4 (FOUR) HOURS AS NEEDED. 360 mL 0   [START ON 12/31/2021] lisdexamfetamine (VYVANSE) 70 MG capsule Take 1 capsule (70 mg total) by mouth daily. 30 capsule 0   lisdexamfetamine (VYVANSE) 70 MG capsule Take 1 capsule (70 mg total) by mouth daily. 30 capsule 0   lisdexamfetamine (VYVANSE) 70 MG capsule Take 1 capsule (70 mg total) by mouth daily. 30 capsule 0   lisinopril-hydrochlorothiazide (ZESTORETIC) 20-25 MG tablet Take 1 tablet by mouth daily. 90 tablet 1   omeprazole (PRILOSEC) 40 MG capsule TAKE 1 CAPSULE BY MOUTH EVERY DAY 90 capsule 1   oxyCODONE-acetaminophen (PERCOCET) 10-325 MG tablet Take 0.5-1 tablets by mouth every 6 (six) hours as needed for pain. 20 tablet 0   rOPINIRole (REQUIP) 0.5 MG tablet Take 1  tablet (0.5 mg total) by mouth at bedtime. 90 tablet 1   rosuvastatin (CRESTOR) 10 MG tablet Take 1 tablet (10 mg total) by mouth at bedtime. 90 tablet 3   sulfamethoxazole-trimethoprim (BACTRIM DS) 800-160 MG tablet Take 1 tablet by mouth 2 (two) times daily. 20 tablet 0   No facility-administered medications prior to visit.    No Known Allergies  ROS Review of Systems    Objective:    Physical Exam Skin:    Comments: Photo below of lesion.  Sutures were removed today.  She does have significant reduction in swelling of the knee and the lower leg which is good but I am still very concerned about the no noted skin that is now an open wound.      Wound measuring 4.5 x 7 cm. Mostly serous drainage.      BP (!) 129/37    Pulse (!) 103    Temp 98.6 F (37 C)    Resp 18    Wt 145 lb (65.8 kg)    SpO2 97%    BMI 22.05 kg/m  Wt Readings from Last 3 Encounters:  12/13/21 145 lb (65.8 kg)  11/02/21 145 lb (65.8 kg)  01/29/21 146 lb (66.2 kg)     Health Maintenance Due  Topic Date Due   COVID-19 Vaccine (1) Never done   COLONOSCOPY (Pts 45-57yr Insurance coverage will need to be confirmed)  Never done   PAP SMEAR-Modifier  07/14/2018   MAMMOGRAM  09/29/2021    There are no preventive care reminders to display for this patient.  Lab Results  Component Value Date   TSH 0.55 10/29/2018   Lab Results  Component Value Date   WBC 14.6 (H) 12/03/2021   HGB 12.0 12/03/2021   HCT 35.2 12/03/2021   MCV 88.9 12/03/2021   PLT 332 12/03/2021   Lab Results  Component Value Date   NA 141 07/06/2021   K 4.1 07/06/2021   CO2 26 07/06/2021   GLUCOSE 75 07/06/2021   BUN 13 07/06/2021   CREATININE 1.02 07/06/2021   BILITOT 0.3 01/29/2021   ALKPHOS 52 05/20/2017   AST 15 01/29/2021   ALT 9 01/29/2021   PROT 6.7 01/29/2021   ALBUMIN 3.8 05/20/2017   CALCIUM 9.3 07/06/2021   EGFR 62 07/06/2021   Lab Results  Component Value Date   CHOL 206 (H) 01/29/2021   Lab Results   Component Value Date   HDL 41 (L) 01/29/2021   Lab Results  Component Value Date  [place] 127 (H) 01/29/2021   Lab Results  Component Value Date   TRIG 236 (H) 01/29/2021   Lab Results  Component Value Date   CHOLHDL 5.0 (H) 01/29/2021   No results found for: HGBA1C    Assessment & Plan:   Problem List Items Addressed This Visit   None Visit Diagnoses     Open wound of left knee, initial encounter    -  Primary       We will place an Allevyn dressing in place and have her come back on Tuesday.  Were closed on Monday so that we can remove the dressing and see how its healing.  I want her to avoid significant flexion of the knee but on but I don't want her to  wear the brace any more.  Concerned it may actually be putting a little too much pressure on the wound.  When she was here last time she felt like it was much more comfortable with the brace on. Will start doxy.     Meds ordered this encounter  Medications   doxycycline (VIBRA-TABS) 100 MG tablet    Sig: Take 1 tablet (100 mg total) by mouth 2 (two) times daily.    Dispense:  20 tablet    Refill:  0    Follow-up: Return in about 5 days (around 12/18/2021) for Wound check .    [name], MD

## 2021-12-18 ENCOUNTER — Ambulatory Visit: Payer: Managed Care, Other (non HMO) | Admitting: Family Medicine

## 2022-01-02 ENCOUNTER — Other Ambulatory Visit: Payer: Self-pay | Admitting: Family Medicine

## 2022-01-02 NOTE — Telephone Encounter (Signed)
[facility] pharmacy requesting med refill for lovastatin. Rx not listed in active med list. Patient currently has rosuvastatin on their active med list.

## 2022-01-03 ENCOUNTER — Encounter: Payer: Self-pay | Admitting: Family Medicine

## 2022-01-03 DIAGNOSIS — F902 Attention-deficit hyperactivity disorder, combined type: Secondary | ICD-10-CM

## 2022-01-03 MED ORDER — LISDEXAMFETAMINE DIMESYLATE 70 MG PO CAPS: 70.0000 mg | ORAL_CAPSULE | Freq: Every day | ORAL | 0 refills | Status: DC

## 2022-01-03 NOTE — Telephone Encounter (Signed)
Meds ordered this encounter  Medications   lisdexamfetamine (VYVANSE) 70 MG capsule    Sig: Take 1 capsule (70 mg total) by mouth daily.    Dispense:  30 capsule    Refill:  0    

## 2022-01-08 ENCOUNTER — Other Ambulatory Visit: Payer: Self-pay | Admitting: Family Medicine

## 2022-01-08 DIAGNOSIS — G2581 Restless legs syndrome: Secondary | ICD-10-CM

## 2022-01-30 ENCOUNTER — Other Ambulatory Visit: Payer: Self-pay | Admitting: Family Medicine

## 2022-02-07 ENCOUNTER — Telehealth: Payer: Self-pay | Admitting: Family Medicine

## 2022-02-07 NOTE — Telephone Encounter (Signed)
Left message advising of recommendations.  

## 2022-02-12 ENCOUNTER — Ambulatory Visit: Payer: Managed Care, Other (non HMO) | Admitting: Family Medicine

## 2022-02-12 ENCOUNTER — Encounter: Payer: Self-pay | Admitting: Family Medicine

## 2022-02-12 ENCOUNTER — Other Ambulatory Visit: Payer: Self-pay

## 2022-02-12 VITALS — BP 132/55 | HR 80 | Wt 144.0 lb

## 2022-02-12 DIAGNOSIS — N649 Disorder of breast, unspecified: Secondary | ICD-10-CM | POA: Diagnosis not present

## 2022-02-12 MED ORDER — DOXYCYCLINE HYCLATE 100 MG PO TABS: 100.0000 mg | ORAL_TABLET | Freq: Two times a day (BID) | ORAL | 0 refills | Status: DC

## 2022-02-12 NOTE — Patient Instructions (Signed)
Apply Vaseline to the wound and just took a piece of loose gauze in your bra to absorb any excess moisture or drainage.  Try to let it breathe at night if you can.  Avoid any astringents or harsh soaps.  Avoid peroxide for now.  Avoid squeezing on the area.

## 2022-02-12 NOTE — Progress Notes (Signed)
Acute Office Visit  Subjective:    Patient ID: Glenda Fernandez, female    DOB: [birth year], 61 y.o.   MRN: [id number]  Chief Complaint  Patient presents with   Wound Infection    HPI Patient is in today for breast issue.  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  She had the implant placed about 50 years ago when she was 13 and it was a saline implant.  She is noticed over the last couple of years it is gotten more hard and over the last year it is actually become a little bit tender which it was not previously.  More recently she noticed that the skin was red and it looked like it had a little ulceration.  She says it has been draining a little bit of pus.  No foul odor.  No fevers or chills.  She is also dealing with a wound on her left knee which she says is finally healing well.  Past Medical History:  Diagnosis Date   Arthritis    osteo   Asthma    Depression    GERD (gastroesophageal reflux disease)    Iron deficiency anemia 09/11/2012    Past Surgical History:  Procedure Laterality Date   BACK SURGERY  2001   BLADDER SUSPENSION     BREAST ENHANCEMENT SURGERY Left 1879   CARPAL TUNNEL RELEASE Right 11/01/2016   Procedure: CARPAL TUNNEL RELEASE;  Surgeon: Dorna Leitz, MD;  Location: [place];  Service: Orthopedics;  Laterality: Right;   HAND SURGERY Left 2013   for fracture, [place] [facility]   NECK SURGERY     ORIF WRIST FRACTURE Right 11/01/2016   Procedure: OPEN REDUCTION INTERNAL FIXATION (ORIF) WRIST FRACTURE;  Surgeon: Dorna Leitz, MD;  Location: [place];  Service: Orthopedics;  Laterality: Right;   SHOULDER SURGERY  2005    Family History  Problem Relation Age of Onset   Coronary artery disease Father    Lung cancer Father        mesothelioma   Hyperlipidemia Father    Alcohol abuse Other        uncle   Breast cancer Mother    Kidney failure Mother    Hyperlipidemia Mother    Bipolar disorder  Daughter    Depression Daughter     Social History   Socioeconomic History   Marital status: Married    Spouse name: Herbie [place]   Number of children: 2   Years of education: Not on file   Highest education level: Not on file  Occupational History   Occupation: Evidence Tech    Comment: [employer]  Tobacco Use   Smoking status: Every Day    Packs/day: 1.00    Years: 40.00    Pack years: 40.00    Types: Cigarettes   Smokeless tobacco: Never  Substance and Sexual Activity   Alcohol use: No   Drug use: Not on file   Sexual activity: Not on file  Other Topics Concern   Not on file  Social History Narrative   Some college.  3 caffeine per day.  Walks 3 times per week for exercise.    Social Determinants of Health   Financial Resource Strain: Not on file  Food Insecurity: Not on file  Transportation Needs: Not on file  Physical Activity: Not on file  Stress: Not on file  Social Connections: Not on file  Intimate Partner Violence: Not on  file    Outpatient Medications Prior to Visit  Medication Sig Dispense Refill   albuterol (VENTOLIN HFA) 108 (90 Base) MCG/ACT inhaler USE 2 PUFFS INTO LUNGS EVERY 6 HOURS AS NEEDED FOR WHEEZE OR FOR SHORTNESS OF BREATH 8.5 each 99   Ferrous Sulfate Dried (HIGH POTENCY IRON) 65 MG TABS Take 1 tablet by mouth daily.     FLUoxetine (PROZAC) 40 MG capsule TAKE 2 CAPSULES BY MOUTH DAILY 180 capsule 1   hydrOXYzine (ATARAX) 10 MG tablet TAKE 1 TO 2 TABLETS BY MOUTH EVERY 8 HOURS AS NEEDED FOR ANXIETY 45 tablet 1   ipratropium-albuterol (DUONEB) 0.5-2.5 (3) MG/3ML SOLN TAKE 3 MLS BY NEBULIZATION EVERY 4 (FOUR) HOURS AS NEEDED. 360 mL 0   lisdexamfetamine (VYVANSE) 70 MG capsule Take 1 capsule (70 mg total) by mouth daily. 30 capsule 0   lisdexamfetamine (VYVANSE) 70 MG capsule Take 1 capsule (70 mg total) by mouth daily. 30 capsule 0   lisdexamfetamine (VYVANSE) 70 MG capsule Take 1 capsule (70 mg total) by mouth daily. 30 capsule 0    lisinopril-hydrochlorothiazide (ZESTORETIC) 20-25 MG tablet Take 1 tablet by mouth daily. 90 tablet 1   omeprazole (PRILOSEC) 40 MG capsule TAKE 1 CAPSULE BY MOUTH EVERY DAY 90 capsule 1   oxyCODONE-acetaminophen (PERCOCET) 10-325 MG tablet Take 0.5-1 tablets by mouth every 6 (six) hours as needed for pain. 20 tablet 0   rOPINIRole (REQUIP) 0.5 MG tablet TAKE 1 TABLET BY MOUTH AT BEDTIME. 90 tablet 0   rosuvastatin (CRESTOR) 10 MG tablet Take 1 tablet (10 mg total) by mouth daily. 90 tablet 3   doxycycline (VIBRA-TABS) 100 MG tablet Take 1 tablet (100 mg total) by mouth 2 (two) times daily. 20 tablet 0   No facility-administered medications prior to visit.    No Known Allergies  Review of Systems     Objective:    Physical Exam Vitals reviewed.  Constitutional:      Appearance: She is well-developed.  HENT:     Head: Normocephalic and atraumatic.  Eyes:     Conjunctiva/sclera: Conjunctivae normal.  Cardiovascular:     Rate and Rhythm: Normal rate.  Pulmonary:     Effort: Pulmonary effort is normal.  Skin:    General: Skin is dry.     Coloration: Skin is not pale.     Comments: On her left breast she has extreme hardness of the tissue over the medial half in the lower half of the breast is much more soft in the right upper outer quadrant.  Around the 7 o'clock position there is significant erythema and purple discoloration of the skin with an approximately 1 cm ulceration.  No active discharge.  Neurological:     Mental Status: She is alert and oriented to person, place, and time.  Psychiatric:        Behavior: Behavior normal.    BP (!) 132/55    Pulse 80    Wt 144 lb (65.3 kg)    SpO2 98%    BMI 21.90 kg/m  Wt Readings from Last 3 Encounters:  02/12/22 144 lb (65.3 kg)  12/13/21 145 lb (65.8 kg)  11/02/21 145 lb (65.8 kg)    Health Maintenance Due  Topic Date Due   COVID-19 Vaccine (1) Never done   COLONOSCOPY (Pts 45-54yr Insurance coverage will need to be  confirmed)  Never done   PAP SMEAR-Modifier  07/14/2018   MAMMOGRAM  09/29/2021    There are no preventive care reminders to display  for this patient.   Lab Results  Component Value Date   TSH 0.55 10/29/2018   Lab Results  Component Value Date   WBC 14.6 (H) 12/03/2021   HGB 12.0 12/03/2021   HCT 35.2 12/03/2021   MCV 88.9 12/03/2021   PLT 332 12/03/2021   Lab Results  Component Value Date   NA 141 07/06/2021   K 4.1 07/06/2021   CO2 26 07/06/2021   GLUCOSE 75 07/06/2021   BUN 13 07/06/2021   CREATININE 1.02 07/06/2021   BILITOT 0.3 01/29/2021   ALKPHOS 52 05/20/2017   AST 15 01/29/2021   ALT 9 01/29/2021   PROT 6.7 01/29/2021   ALBUMIN 3.8 05/20/2017   CALCIUM 9.3 07/06/2021   EGFR 62 07/06/2021   Lab Results  Component Value Date   CHOL 206 (H) 01/29/2021   Lab Results  Component Value Date   HDL 41 (L) 01/29/2021   Lab Results  Component Value Date   LDLCALC 127 (H) 01/29/2021   Lab Results  Component Value Date   TRIG 236 (H) 01/29/2021   Lab Results  Component Value Date   CHOLHDL 5.0 (H) 01/29/2021   No results found for: HGBA1C     Assessment & Plan:   Problem List Items Addressed This Visit   None Visit Diagnoses     Breast lesion    -  Primary   Relevant Orders   Ambulatory referral to General Surgery      Very concerned about the hardness around the implant as well as the lesion on the breast tissue.  I really think that it needs to be biopsied at this point she is so tender I do not know that she would tolerate mammogram and I do not know that it would be is helpful is just referring her to the breast surgeon for further evaluation.  I think the implant needs to be removed and she likely needs biopsy of this tissue.  They might consider MRI for further work-up as well to see if there may be a leak from the implant.  In the meantime I am going to place her on doxycycline since she has noticed some discharge from the wound though I was  not able to recreate any on exam today.  If it is not getting better then please let us know.  Wound care reviewed  Meds ordered this encounter  Medications   doxycycline (VIBRA-TABS) 100 MG tablet    Sig: Take 1 tablet (100 mg total) by mouth 2 (two) times daily.    Dispense:  20 tablet    Refill:  0     [name], MD

## 2022-02-20 ENCOUNTER — Other Ambulatory Visit: Payer: Self-pay | Admitting: Family Medicine

## 2022-02-20 DIAGNOSIS — K2100 Gastro-esophageal reflux disease with esophagitis, without bleeding: Secondary | ICD-10-CM

## 2022-02-22 ENCOUNTER — Encounter: Payer: Self-pay | Admitting: Family Medicine

## 2022-02-22 DIAGNOSIS — F902 Attention-deficit hyperactivity disorder, combined type: Secondary | ICD-10-CM

## 2022-02-22 MED ORDER — LISDEXAMFETAMINE DIMESYLATE 70 MG PO CAPS: 70.0000 mg | ORAL_CAPSULE | Freq: Every day | ORAL | 0 refills | Status: DC

## 2022-03-06 ENCOUNTER — Ambulatory Visit (INDEPENDENT_AMBULATORY_CARE_PROVIDER_SITE_OTHER): Payer: Managed Care, Other (non HMO) | Admitting: Sports Medicine

## 2022-03-06 ENCOUNTER — Ambulatory Visit: Payer: Managed Care, Other (non HMO) | Admitting: Sports Medicine

## 2022-03-06 ENCOUNTER — Other Ambulatory Visit: Payer: Self-pay

## 2022-03-06 ENCOUNTER — Ambulatory Visit (INDEPENDENT_AMBULATORY_CARE_PROVIDER_SITE_OTHER): Payer: Managed Care, Other (non HMO)

## 2022-03-06 DIAGNOSIS — S6992XA Unspecified injury of left wrist, hand and finger(s), initial encounter: Secondary | ICD-10-CM

## 2022-03-06 DIAGNOSIS — S62333A Displaced fracture of neck of third metacarpal bone, left hand, initial encounter for closed fracture: Secondary | ICD-10-CM | POA: Insufficient documentation

## 2022-03-06 DIAGNOSIS — S62363A Nondisplaced fracture of neck of third metacarpal bone, left hand, initial encounter for closed fracture: Secondary | ICD-10-CM

## 2022-03-06 HISTORY — DX: Displaced fracture of neck of third metacarpal bone, left hand, initial encounter for closed fracture: S62.333A

## 2022-03-06 MED ORDER — HYDROCODONE-ACETAMINOPHEN 5-325 MG PO TABS: 1.0000 | ORAL_TABLET | Freq: Three times a day (TID) | ORAL | 0 refills | Status: DC | PRN

## 2022-03-06 NOTE — Assessment & Plan Note (Addendum)
Pleasant 62 year old female, had a fall 2 days ago, she is got bruising and swelling over her left hand, tenderness to palpation discretely over the dorsal hand, specifically third metacarpal. ? ?Update: X-rays reviewed, there is a nondisplaced fracture through the third metacarpal neck, she will get an over-the-counter Velcro wrist brace, adding some hydrocodone for pain, return to see me in 3 weeks. ?

## 2022-03-06 NOTE — Progress Notes (Signed)
? ? ?  Procedures performed today:   ? ?None. ? ?Independent interpretation of notes and tests performed by another provider:  ? ?X-rays personally reviewed, there is a nondisplaced fracture through the neck of the third metacarpal ? ?Brief History, Exam, Impression, and Recommendations:   ? ?Closed fracture of neck of third metacarpal bone of left hand ?Pleasant 61 year old female, had a fall 2 days ago, she is got bruising and swelling over her left hand, tenderness to palpation discretely over the dorsal hand, specifically third metacarpal. ? ?Update: X-rays reviewed, there is a nondisplaced fracture through the third metacarpal neck, she will get an over-the-counter Velcro wrist brace, adding some hydrocodone for pain, return to see me in 3 weeks. ? ? ?___________________________________________ ?[name]. Benjamin Stain, M.D., ABFM., CAQSM. ?Primary Care and Sports Medicine ?[name] ? ?Adjunct Instructor of Family Medicine  ?[employer] ?

## 2022-03-07 ENCOUNTER — Telehealth: Payer: Self-pay

## 2022-03-07 ENCOUNTER — Encounter: Payer: Self-pay | Admitting: Sports Medicine

## 2022-03-07 DIAGNOSIS — S62363A Nondisplaced fracture of neck of third metacarpal bone, left hand, initial encounter for closed fracture: Secondary | ICD-10-CM

## 2022-03-07 DIAGNOSIS — S6992XA Unspecified injury of left wrist, hand and finger(s), initial encounter: Secondary | ICD-10-CM

## 2022-03-07 MED ORDER — HYDROCODONE-ACETAMINOPHEN 5-325 MG PO TABS: 1.0000 | ORAL_TABLET | Freq: Three times a day (TID) | ORAL | 0 refills | Status: DC | PRN

## 2022-03-07 NOTE — Telephone Encounter (Signed)
Patient reports the pharmacy did not have the medication. She called around and found it at [facility] [employer] here in [place]. Please send it in there.  ?

## 2022-03-07 NOTE — Telephone Encounter (Signed)
I sent it earlier today.

## 2022-03-12 ENCOUNTER — Other Ambulatory Visit: Payer: Self-pay | Admitting: Surgery

## 2022-03-12 DIAGNOSIS — Z853 Personal history of malignant neoplasm of breast: Secondary | ICD-10-CM

## 2022-03-14 ENCOUNTER — Other Ambulatory Visit: Payer: Self-pay | Admitting: Surgery

## 2022-03-14 DIAGNOSIS — N644 Mastodynia: Secondary | ICD-10-CM

## 2022-03-19 ENCOUNTER — Other Ambulatory Visit: Payer: Self-pay | Admitting: Family Medicine

## 2022-03-19 DIAGNOSIS — I10 Essential (primary) hypertension: Secondary | ICD-10-CM

## 2022-03-20 ENCOUNTER — Other Ambulatory Visit: Payer: Self-pay | Admitting: Physician Assistant

## 2022-03-20 ENCOUNTER — Encounter: Payer: Self-pay | Admitting: Family Medicine

## 2022-03-20 DIAGNOSIS — F902 Attention-deficit hyperactivity disorder, combined type: Secondary | ICD-10-CM

## 2022-03-20 MED ORDER — LISDEXAMFETAMINE DIMESYLATE 70 MG PO CAPS: 70.0000 mg | ORAL_CAPSULE | Freq: Every day | ORAL | 0 refills | Status: DC

## 2022-03-20 NOTE — Progress Notes (Signed)
Refill sent. Needs appt with PCP in 1 month.  ?

## 2022-03-23 ENCOUNTER — Other Ambulatory Visit: Payer: Self-pay | Admitting: Family Medicine

## 2022-03-23 DIAGNOSIS — G2581 Restless legs syndrome: Secondary | ICD-10-CM

## 2022-03-27 ENCOUNTER — Ambulatory Visit: Payer: Managed Care, Other (non HMO) | Admitting: Sports Medicine

## 2022-04-02 ENCOUNTER — Other Ambulatory Visit: Payer: Managed Care, Other (non HMO)

## 2022-04-08 ENCOUNTER — Other Ambulatory Visit: Payer: Self-pay | Admitting: Family Medicine

## 2022-04-11 ENCOUNTER — Other Ambulatory Visit: Payer: Self-pay | Admitting: Surgery

## 2022-04-11 ENCOUNTER — Ambulatory Visit
Admission: RE | Admit: 2022-04-11 | Discharge: 2022-04-11 | Disposition: A | Discharge status: Home / Self Care | Payer: Managed Care, Other (non HMO) | Source: Ambulatory Visit | Attending: Surgery | Admitting: Surgery

## 2022-04-11 DIAGNOSIS — N644 Mastodynia: Secondary | ICD-10-CM

## 2022-04-16 ENCOUNTER — Encounter: Payer: Self-pay | Admitting: Family Medicine

## 2022-04-16 DIAGNOSIS — F902 Attention-deficit hyperactivity disorder, combined type: Secondary | ICD-10-CM

## 2022-04-17 ENCOUNTER — Other Ambulatory Visit: Payer: Self-pay | Admitting: Family Medicine

## 2022-04-17 DIAGNOSIS — Z45819 Encounter for adjustment or removal of unspecified breast implant: Secondary | ICD-10-CM

## 2022-04-17 MED ORDER — LISDEXAMFETAMINE DIMESYLATE 70 MG PO CAPS: 70.0000 mg | ORAL_CAPSULE | Freq: Every day | ORAL | 0 refills | Status: DC

## 2022-04-17 NOTE — Progress Notes (Unsigned)
Meds ordered this encounter  ?Medications  ? lisdexamfetamine (VYVANSE) 70 MG capsule  ?  Sig: Take 1 capsule (70 mg total) by mouth daily.  ?  Dispense:  30 capsule  ?  Refill:  0  ? lisdexamfetamine (VYVANSE) 70 MG capsule  ?  Sig: Take 1 capsule (70 mg total) by mouth daily.  ?  Dispense:  30 capsule  ?  Refill:  0  ? ? ?

## 2022-04-17 NOTE — Progress Notes (Signed)
Orders Placed This Encounter  ?Procedures  ? Ambulatory referral to Plastic Surgery  ?  Referral Priority:   Routine  ?  Referral Type:   Surgical  ?  Referral Reason:   Specialty Services Required  ?  Requested Specialty:   Plastic Surgery  ?  Number of Visits Requested:   1  ? ? ?

## 2022-05-16 ENCOUNTER — Institutional Professional Consult (permissible substitution): Payer: Managed Care, Other (non HMO) | Admitting: Plastic Surgery

## 2022-05-21 ENCOUNTER — Other Ambulatory Visit: Payer: Self-pay | Admitting: Family Medicine

## 2022-05-21 DIAGNOSIS — F902 Attention-deficit hyperactivity disorder, combined type: Secondary | ICD-10-CM

## 2022-05-22 MED ORDER — LISDEXAMFETAMINE DIMESYLATE 70 MG PO CAPS: 70.0000 mg | ORAL_CAPSULE | Freq: Every day | ORAL | 0 refills | Status: DC

## 2022-05-28 ENCOUNTER — Telehealth: Payer: Managed Care, Other (non HMO) | Admitting: Family Medicine

## 2022-06-07 ENCOUNTER — Other Ambulatory Visit: Payer: Self-pay | Admitting: Family Medicine

## 2022-06-09 ENCOUNTER — Other Ambulatory Visit: Payer: Self-pay | Admitting: Family Medicine

## 2022-06-09 DIAGNOSIS — F330 Major depressive disorder, recurrent, mild: Secondary | ICD-10-CM

## 2022-06-17 ENCOUNTER — Encounter: Payer: Self-pay | Admitting: Family Medicine

## 2022-06-17 ENCOUNTER — Telehealth (INDEPENDENT_AMBULATORY_CARE_PROVIDER_SITE_OTHER): Payer: Managed Care, Other (non HMO) | Admitting: Family Medicine

## 2022-06-17 VITALS — BP 118/74 | Temp 98.3°F | Ht 68.0 in | Wt 145.0 lb

## 2022-06-17 DIAGNOSIS — I10 Essential (primary) hypertension: Secondary | ICD-10-CM

## 2022-06-17 DIAGNOSIS — G2581 Restless legs syndrome: Secondary | ICD-10-CM | POA: Diagnosis not present

## 2022-06-17 DIAGNOSIS — Z1211 Encounter for screening for malignant neoplasm of colon: Secondary | ICD-10-CM

## 2022-06-17 DIAGNOSIS — F902 Attention-deficit hyperactivity disorder, combined type: Secondary | ICD-10-CM | POA: Diagnosis not present

## 2022-06-17 DIAGNOSIS — D509 Iron deficiency anemia, unspecified: Secondary | ICD-10-CM

## 2022-06-17 MED ORDER — LISDEXAMFETAMINE DIMESYLATE 70 MG PO CAPS: 70.0000 mg | ORAL_CAPSULE | Freq: Every day | ORAL | 0 refills | Status: DC

## 2022-06-17 MED ORDER — ROPINIROLE HCL 0.5 MG PO TABS: ORAL_TABLET | ORAL | 3 refills | Status: DC

## 2022-06-17 NOTE — Assessment & Plan Note (Signed)
Well controlled. Continue current regimen. Follow up in  4 months.   

## 2022-06-17 NOTE — Progress Notes (Signed)
Virtual Visit via Video Note  I connected with Glenda Fernandez on 06/17/22 at  1:00 PM EDT by a video enabled telemedicine application and verified that I am speaking with the correct person using two identifiers.   I discussed the limitations of evaluation and management by telemedicine and the availability of in person appointments. The patient expressed understanding and agreed to proceed.  Patient location: at home Provider location: in office  Subjective:    CC:   Chief Complaint  Patient presents with   ADHD    Follow up     HPI:  ADHD - Reports symptoms are well controlled on current regime. Denies any problems with insomnia, chest pain, palpitations, or SOB.    She does have an appointment coming up with a consultation with a plastic surgeon to have her breast implants removed.  She unfortunately missed her last appointment.  But has it scheduled soon.  Past medical history, Surgical history, Family history not pertinant except as noted below, Social history, Allergies, and medications have been entered into the medical record, reviewed, and corrections made.    Objective:    General: Speaking clearly in complete sentences without any shortness of breath.  Alert and oriented x3.  Normal judgment. No apparent acute distress.    Impression and Recommendations:    Problem List Items Addressed This Visit       Cardiovascular and Mediastinum   Essential hypertension    Blood pressure looks great today.      Relevant Orders   Lipid Panel w/reflex Direct LDL   COMPLETE METABOLIC PANEL WITH GFR   CBC   Fe+TIBC+Fer     Other   RLS (restless legs syndrome)    Feels like the ropinirole works well and she is also taking her iron supplement.  Does need refills.  Due to recheck iron levels.      Relevant Medications   rOPINIRole (REQUIP) 0.5 MG tablet   Iron deficiency anemia   Relevant Orders   Lipid Panel w/reflex Direct LDL   COMPLETE METABOLIC PANEL WITH  GFR   CBC   Fe+TIBC+Fer   ADHD (attention deficit hyperactivity disorder)    Well controlled. Continue current regimen. Follow up in  4 months.       Relevant Medications   lisdexamfetamine (VYVANSE) 70 MG capsule (Start on 08/16/2022)   lisdexamfetamine (VYVANSE) 70 MG capsule (Start on 07/17/2022)   lisdexamfetamine (VYVANSE) 70 MG capsule   Other Visit Diagnoses     Colon cancer screening    -  Primary   Relevant Orders   Cologuard       Orders Placed This Encounter  Procedures   Cologuard   Lipid Panel w/reflex Direct LDL   COMPLETE METABOLIC PANEL WITH GFR   CBC   Fe+TIBC+Fer    Meds ordered this encounter  Medications   lisdexamfetamine (VYVANSE) 70 MG capsule    Sig: Take 1 capsule (70 mg total) by mouth daily.    Dispense:  30 capsule    Refill:  0   lisdexamfetamine (VYVANSE) 70 MG capsule    Sig: Take 1 capsule (70 mg total) by mouth daily.    Dispense:  30 capsule    Refill:  0   lisdexamfetamine (VYVANSE) 70 MG capsule    Sig: Take 1 capsule (70 mg total) by mouth daily.    Dispense:  30 capsule    Refill:  0   rOPINIRole (REQUIP) 0.5 MG tablet    Sig:  TAKE 1 TABLET BY MOUTH EVERYDAY AT BEDTIME    Dispense:  90 tablet    Refill:  3   She says she is can to try to come for labs on Thursday.  I discussed the assessment and treatment plan with the patient. The patient was provided an opportunity to ask questions and all were answered. The patient agreed with the plan and demonstrated an understanding of the instructions.   The patient was advised to call back or seek an in-person evaluation if the symptoms worsen or if the condition fails to improve as anticipated.   Nani Gasser, MD

## 2022-06-17 NOTE — Assessment & Plan Note (Signed)
Blood pressure looks great today. 

## 2022-06-17 NOTE — Assessment & Plan Note (Signed)
Feels like the ropinirole works well and she is also taking her iron supplement.  Does need refills.  Due to recheck iron levels.

## 2022-06-20 ENCOUNTER — Ambulatory Visit: Payer: Managed Care, Other (non HMO) | Admitting: Plastic Surgery

## 2022-06-20 ENCOUNTER — Encounter: Payer: Self-pay | Admitting: Plastic Surgery

## 2022-06-20 VITALS — BP 122/69 | HR 89 | Ht 67.0 in | Wt 145.0 lb

## 2022-06-20 DIAGNOSIS — T8543XA Leakage of breast prosthesis and implant, initial encounter: Secondary | ICD-10-CM

## 2022-06-20 NOTE — Progress Notes (Signed)
Referring Provider Agapito Games, MD [address] [place],  [place] [address]   CC:  Chief Complaint  Patient presents with   Consult      Glenda Fernandez is an 61 y.o. female.  HPI: Patient presents to discuss problems with her left breast implant.  It was placed around 1975 for breast asymmetry.  She also had a right sided breast reduction at some point.  Over the past 6 months or so she has noticed pain and contracture in the left breast implant.  She is also noted some inflammation in the skin in the inferomedial quadrant.  She has had an MRI scan which showed no abscess but signs of significant extracapsular implant rupture and the theory is that is that is causing the inflammation.  She would like to have the implant removed along with the affected skin.  No Known Allergies  Outpatient Encounter Medications as of 06/20/2022  Medication Sig   albuterol (VENTOLIN HFA) 108 (90 Base) MCG/ACT inhaler USE 2 PUFFS INTO LUNGS EVERY 6 HOURS AS NEEDED FOR WHEEZE OR FOR SHORTNESS OF BREATH   Ferrous Sulfate Dried (HIGH POTENCY IRON) 65 MG TABS Take 1 tablet by mouth daily.   FLUoxetine (PROZAC) 40 MG capsule TAKE 2 CAPSULES BY MOUTH EVERY DAY   hydrOXYzine (ATARAX) 10 MG tablet TAKE 1 TO 2 TABLETS BY MOUTH EVERY 8 HOURS AS NEEDED FOR ANXIETY   ipratropium-albuterol (DUONEB) 0.5-2.5 (3) MG/3ML SOLN TAKE 3 MLS BY NEBULIZATION EVERY 4 (FOUR) HOURS AS NEEDED.   [START ON 08/16/2022] lisdexamfetamine (VYVANSE) 70 MG capsule Take 1 capsule (70 mg total) by mouth daily.   [START ON 07/17/2022] lisdexamfetamine (VYVANSE) 70 MG capsule Take 1 capsule (70 mg total) by mouth daily.   lisdexamfetamine (VYVANSE) 70 MG capsule Take 1 capsule (70 mg total) by mouth daily.   lisinopril-hydrochlorothiazide (ZESTORETIC) 20-25 MG tablet TAKE 1 TABLET BY MOUTH EVERY DAY   omeprazole (PRILOSEC) 40 MG capsule TAKE 1 CAPSULE BY MOUTH EVERY DAY   rOPINIRole (REQUIP) 0.5 MG tablet TAKE 1 TABLET BY  MOUTH EVERYDAY AT BEDTIME   rosuvastatin (CRESTOR) 10 MG tablet Take 1 tablet (10 mg total) by mouth daily.   No facility-administered encounter medications on file as of 06/20/2022.     Past Medical History:  Diagnosis Date   Arthritis    osteo   Asthma    Depression    GERD (gastroesophageal reflux disease)    Iron deficiency anemia 09/11/2012    Past Surgical History:  Procedure Laterality Date   BACK SURGERY  2001   BLADDER SUSPENSION     BREAST ENHANCEMENT SURGERY Left 1879   CARPAL TUNNEL RELEASE Right 11/01/2016   Procedure: CARPAL TUNNEL RELEASE;  Surgeon: Jodi Geralds, MD;  Location: [facility];  Service: Orthopedics;  Laterality: Right;   HAND SURGERY Left 2013   for fracture, [facility] [facility]   NECK SURGERY     ORIF WRIST FRACTURE Right 11/01/2016   Procedure: OPEN REDUCTION INTERNAL FIXATION (ORIF) WRIST FRACTURE;  Surgeon: Jodi Geralds, MD;  Location: [facility];  Service: Orthopedics;  Laterality: Right;   SHOULDER SURGERY  2005    Family History  Problem Relation Age of Onset   Coronary artery disease Father    Lung cancer Father        mesothelioma   Hyperlipidemia Father    Alcohol abuse Other        uncle   Breast cancer Mother  Kidney failure Mother    Hyperlipidemia Mother    Bipolar disorder Daughter    Depression Daughter     Social History   Social History Narrative   Some college.  3 caffeine per day.  Walks 3 times per week for exercise.      Review of Systems General: Denies fevers, chills, weight loss CV: Denies chest pain, shortness of breath, palpitations  Physical Exam    06/20/2022    1:34 PM 06/17/2022   11:52 AM 02/12/2022    2:55 PM  Vitals with BMI  Height 5\' 7"  5\' 8"    Weight 145 lbs 145 lbs 144 lbs  BMI 22.71 22.05   Systolic 122 118  Diastolic 69 74 55  Pulse 89  80    General:  No acute distress,  Alert and oriented, Non-Toxic, Normal speech and affect Examination shows severe capsular  contracture on the left side.  There is an area of erythematous ulcerated skin that is about 5 x 3-1/2 cm.  Signs of reduction scars on the right side but her volume on that side is still quite a bit larger than the left with a much lower nipple position.  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  I recommended removal of the implant total capsulectomy along with removal of the inferomedial quadrant skin.  I did bring up that removal of that skin would distort the shape of the remaining breast and areolar position to some degree but if it is related to silicone it might be difficult to get all the silicone out without removing the skin itself.  Furthermore I did explain there was a small chance of malignancy which was missed on MRI and it seems preferable to send the affected skin for pathologic analysis.  We discussed the details of the surgery including risks that include bleeding, infection, damage to surrounding structures need for additional procedures.  Discussed the location and orientation of the scars.  I discussed that her asymmetry would be further worsened postoperatively and she is aware and accepts that.  I discussed the need for drains postoperatively.  All her questions were answered she is interested in moving forward.  06/20/2022, 2:06 PM

## 2022-07-09 ENCOUNTER — Encounter: Payer: Self-pay | Admitting: Family Medicine

## 2022-07-10 NOTE — Telephone Encounter (Signed)
I would have her call her pharmacy and find out what they have in stock and then we can figure out what we can pare

## 2022-07-11 ENCOUNTER — Encounter: Payer: Self-pay | Admitting: Family Medicine

## 2022-07-11 DIAGNOSIS — R195 Other fecal abnormalities: Secondary | ICD-10-CM

## 2022-07-11 MED ORDER — LISDEXAMFETAMINE DIMESYLATE 20 MG PO CAPS: 20.0000 mg | ORAL_CAPSULE | Freq: Every day | ORAL | 0 refills | Status: DC

## 2022-07-11 MED ORDER — LISDEXAMFETAMINE DIMESYLATE 50 MG PO CAPS: 50.0000 mg | ORAL_CAPSULE | Freq: Every day | ORAL | 0 refills | Status: DC

## 2022-07-12 LAB — COLOGUARD: COLOGUARD: POSITIVE — AB

## 2022-07-12 NOTE — Progress Notes (Signed)
Hi Glenda Fernandez, the Cologuard test came back positive which means you are at higher risk for colon cancer so it is important that we move forward with a colon anoscopy.  If you have a preference for provider or location please let me know and I will place the referral.

## 2022-07-15 NOTE — Addendum Note (Signed)
Addended by: Chalmers Cater on: 07/15/2022 08:30 AM   Modules accepted: Orders

## 2022-07-23 ENCOUNTER — Telehealth: Payer: Self-pay | Admitting: *Deleted

## 2022-07-23 NOTE — Telephone Encounter (Signed)
Multiple unsuccessful attempts have been made to reach pt. Voice received from pt stating that her phone has been messed and to call [phone]. Person answering the call states that he is her husband and her phone has been messed up. Left message that I was returning her call and to have Ms. Sivertson call the office when able. States he will give her the message

## 2022-07-23 NOTE — Telephone Encounter (Signed)
Call received from Ms. Faylene Million. Discussed need to have her see new provider since Dr. Arita Miss is no longer with [facility]. Appointment set-up for 8/11. Pt verbalized understanding.

## 2022-07-26 ENCOUNTER — Ambulatory Visit (INDEPENDENT_AMBULATORY_CARE_PROVIDER_SITE_OTHER): Payer: Managed Care, Other (non HMO) | Admitting: Student

## 2022-07-26 DIAGNOSIS — T8543XA Leakage of breast prosthesis and implant, initial encounter: Secondary | ICD-10-CM

## 2022-07-26 NOTE — Progress Notes (Signed)
Referring Provider Agapito Games, MD [address] [place],  [place] [address]   CC:  Chief Complaint  Patient presents with   Follow-up      Glenda Fernandez is an 61 y.o. female.  HPI: Patient is a 61 year old female with history of breast implant rupture to the left breast.    Patient was seen by Dr. Arita Miss on 06/20/2022 for consult.  Patient reported that her left breast was placed around 1975 for breast asymmetry.  Patient also reported she had a right breast reduction at some point.  Patient has recently noticed a contracture to the left breast implant as well as skin inflammation.  She had an MRI completed which showed an extracapsular implant rupture.  At this consult, patient reported she wanted the implant as well as the affected skin removed.  Today, patient reports she is doing well.  She states she saw Dr. Arita Miss back in July for consult for implant removal and skin removal as well.  Patient states she would still like to move forward with this plan.  Patient denies any changes to her breast since she saw Dr. Arita Miss.  She denies any recent fevers, chills or shortness of breath.  She reports past medical history including depression, ADHD, high cholesterol.  She denies any history of diabetes.  Patient reports she is a smoker.    Review of Systems General: Denies fevers, chills, SOB  Physical Exam    06/20/2022    1:34 PM 06/17/2022   11:52 AM 02/12/2022    2:55 PM  Vitals with BMI  Height 5\' 7"  5\' 8"    Weight 145 lbs 145 lbs 144 lbs  BMI 22.71 22.05   Systolic 122 118  Diastolic 69 74 55  Pulse 89  80    General:  No acute distress,  Alert and oriented, Non-Toxic, Normal speech and affect On exam, the left breast is fairly firm and smaller than the right breast.  Patient has a 4 cm x 3 cm area of what appears to be scarring to the medial inferior aspect of her left breast.  There appears to be a small scab located centrally to this area.  There is no  drainage noted on exam.  There is no overlying erythema.   Assessment/Plan  Breast implant rupture, initial encounter    Dr. came in the room and discussed the different options for surgery with the patient.  She discussed options such as removing the implant, and then waiting to replace the implant after the patient heals after surgery if the patient would like the implant replaced.  She also discussed the possibility of a reduction on the right side to provide better symmetry for her breast.    Patient stated that she would like to move forward with the plan of removing her left breast implant and skin that is affected, and then allowing that to heal and later deciding if she would like the implant replaced.  I counseled the patient on smoking cessation and advised that she stop smoking as soon as possible prior to surgery.  I discussed with the patient that smoking increases her chances of delayed wound healing and complications.  Patient expressed understanding and states that she is going to try and stop smoking as soon as possible.  I instructed the patient to call back if she has any other questions or concerns.  Dr. [id number] examined the patient and discussed the plan with the patient.  Laurena Spies 07/26/2022, 10:49 AM

## 2022-08-07 ENCOUNTER — Encounter: Payer: Self-pay | Admitting: Family Medicine

## 2022-08-09 ENCOUNTER — Other Ambulatory Visit: Payer: Self-pay | Admitting: Family Medicine

## 2022-08-09 NOTE — Telephone Encounter (Signed)
Per patient - I sent a msg Wednesday as well as just a few mins ago however I forgot that I could send a note regarding my refill.  [facility] still without [facility] 70 mgs and as of Wednesday evening only had the 30 and 40 mgs available. So with that being said can you please send a prescription over for a month of 30's and a month of 40's. Maybe things will return to normal by next month.

## 2022-08-12 ENCOUNTER — Telehealth: Payer: Self-pay

## 2022-08-12 DIAGNOSIS — F902 Attention-deficit hyperactivity disorder, combined type: Secondary | ICD-10-CM

## 2022-08-12 MED ORDER — LISDEXAMFETAMINE DIMESYLATE 30 MG PO CAPS: 30.0000 mg | ORAL_CAPSULE | Freq: Every day | ORAL | 0 refills | Status: DC

## 2022-08-12 MED ORDER — LISDEXAMFETAMINE DIMESYLATE 40 MG PO CAPS: 40.0000 mg | ORAL_CAPSULE | Freq: Every day | ORAL | 0 refills | Status: DC

## 2022-08-12 NOTE — Telephone Encounter (Signed)
Meds ordered this encounter  Medications   lisdexamfetamine (VYVANSE) 40 MG capsule    Sig: Take 1 capsule (40 mg total) by mouth daily.    Dispense:  30 capsule    Refill:  0   lisdexamfetamine (VYVANSE) 30 MG capsule    Sig: Take 1 capsule (30 mg total) by mouth daily.    Dispense:  30 capsule    Refill:  0     I never received any request on on this patients. This is the first request I have seen.

## 2022-08-12 NOTE — Telephone Encounter (Signed)
Patient stopped by the office today requesting an update on her rx request. Per patient, currently out of vyvanse. Patient stated a Mychart msg was sent to the provider. The pharmacy is out of 70 mg. Patient is requesting a rx for 30 mg and 40 mg. Please advise, thanks.

## 2022-08-13 ENCOUNTER — Telehealth: Payer: Self-pay | Admitting: *Deleted

## 2022-08-13 LAB — CBC
HCT: 36 % (ref 35.0–45.0)
Hemoglobin: 12.2 g/dL (ref 11.7–15.5)
MCH: 31.1 pg (ref 27.0–33.0)
MCHC: 33.9 g/dL (ref 32.0–36.0)
MCV: 91.8 fL (ref 80.0–100.0)
MPV: 10.7 fL (ref 7.5–12.5)
Platelets: 237 10*3/uL (ref 140–400)
RBC: 3.92 10*6/uL (ref 3.80–5.10)
RDW: 12.9 % (ref 11.0–15.0)
WBC: 5.9 10*3/uL (ref 3.8–10.8)

## 2022-08-13 LAB — COMPLETE METABOLIC PANEL WITH GFR
AG Ratio: 1.6 (calc) (ref 1.0–2.5)
ALT: 14 U/L (ref 6–29)
AST: 17 U/L (ref 10–35)
Albumin: 4.2 g/dL (ref 3.6–5.1)
Alkaline phosphatase (APISO): 71 U/L (ref 37–153)
BUN/Creatinine Ratio: 14 (calc) (ref 6–22)
BUN: 15 mg/dL (ref 7–25)
CO2: 24 mmol/L (ref 20–32)
Calcium: 9.2 mg/dL (ref 8.6–10.4)
Chloride: 105 mmol/L (ref 98–110)
Creat: 1.09 mg/dL — ABNORMAL HIGH (ref 0.50–1.05)
Globulin: 2.7 g/dL (calc) (ref 1.9–3.7)
Glucose, Bld: 99 mg/dL (ref 65–99)
Potassium: 4.2 mmol/L (ref 3.5–5.3)
Sodium: 139 mmol/L (ref 135–146)
Total Bilirubin: 0.3 mg/dL (ref 0.2–1.2)
Total Protein: 6.9 g/dL (ref 6.1–8.1)
eGFR: 57 mL/min/{1.73_m2} — ABNORMAL LOW (ref >=60)

## 2022-08-13 LAB — IRON,TIBC AND FERRITIN PANEL
%SAT: 60 % (calc) — ABNORMAL HIGH (ref 16–45)
Ferritin: 101 ng/mL (ref 16–288)
Iron: 150 ug/dL (ref 45–160)
TIBC: 248 mcg/dL (calc) — ABNORMAL LOW (ref 250–450)

## 2022-08-13 LAB — LIPID PANEL W/REFLEX DIRECT LDL
Cholesterol: 166 mg/dL (ref <200)
HDL: 52 mg/dL (ref >=50)
LDL Cholesterol (Calc): 91 mg/dL (calc)
Non-HDL Cholesterol (Calc): 114 mg/dL (calc) (ref <130)
Total CHOL/HDL Ratio: 3.2 (calc) (ref <5.0)
Triglycerides: 125 mg/dL (ref <150)

## 2022-08-13 MED ORDER — LISDEXAMFETAMINE DIMESYLATE 30 MG PO CAPS: 30.0000 mg | ORAL_CAPSULE | Freq: Every day | ORAL | 0 refills | Status: DC

## 2022-08-13 NOTE — Telephone Encounter (Signed)
OK, rx corrected.  I think the problem was I had selected one of her 70 mg tabs and just hit reorder but then change the dose not realizing it had already been dated from where he had had originally sent 3 prescriptions for the 70 mg Vyvanse.  Nonetheless I have corrected it and have resent.  Tell her I apologize for the delay.

## 2022-08-13 NOTE — Telephone Encounter (Signed)
Please review this pt's message.  I am unsure why there is a hold on the medication until 08/16/2022.  Tiajuana Amass, CMA

## 2022-08-13 NOTE — Telephone Encounter (Signed)
Auth submitted via fax to [employer]:  Fax confirmation: This message was sent via FAXCOM, a product from [employer]. http://www.biscom.com/                    -------Fax Transmission Report-------  To:               Recipient at [phone] Subject:          [Secure] Rutherford Nail request Faylene Million, W. Result:           The transmission was successful. Explanation:      All Pages Ok Pages Sent:       19 Connect Time:     24 minutes, 32 seconds Transmit Time:    08/13/2022 15:42 Transfer Rate:    [id number] Status Code:      0000 Retry Count:      0 Job Id:           [id number] Unique Id:        [id number] Fax Line:         38 Fax Server:       [id number]

## 2022-08-13 NOTE — Progress Notes (Signed)
Glenda Fernandez, kidney function is stable at 1.0.  Iron stores look much much better compared to a year ago so great work!  If you are taking extra iron you can stop it at this point and just make sure you are eating foods that are rich in iron.  Cholesterol also looks much much better compared to last year so great work!  Complete blood count is also normal.

## 2022-08-13 NOTE — Addendum Note (Signed)
Addended by: Nani Gasser D on: 08/13/2022 12:18 PM   Modules accepted: Orders

## 2022-08-16 ENCOUNTER — Other Ambulatory Visit: Payer: Self-pay | Admitting: Family Medicine

## 2022-08-16 ENCOUNTER — Telehealth: Payer: Self-pay | Admitting: *Deleted

## 2022-08-16 DIAGNOSIS — K2100 Gastro-esophageal reflux disease with esophagitis, without bleeding: Secondary | ICD-10-CM

## 2022-08-16 NOTE — Progress Notes (Signed)
Lets just mail her a letter since she does not seem to be calling us back.

## 2022-08-16 NOTE — Telephone Encounter (Signed)
LVM to schedule surgery

## 2022-09-01 ENCOUNTER — Encounter: Payer: Self-pay | Admitting: Family Medicine

## 2022-09-01 DIAGNOSIS — R5383 Other fatigue: Secondary | ICD-10-CM

## 2022-09-01 DIAGNOSIS — I10 Essential (primary) hypertension: Secondary | ICD-10-CM

## 2022-09-02 NOTE — Telephone Encounter (Signed)
Yes please give her the phone number to call herself.  We can always have her come in to have the TSH drawn if she would like okay with ordering it and can use diagnosis code of fatigue.

## 2022-09-09 ENCOUNTER — Other Ambulatory Visit: Payer: Self-pay | Admitting: Family Medicine

## 2022-09-09 DIAGNOSIS — F902 Attention-deficit hyperactivity disorder, combined type: Secondary | ICD-10-CM

## 2022-09-13 ENCOUNTER — Other Ambulatory Visit: Payer: Self-pay | Admitting: Family Medicine

## 2022-09-13 DIAGNOSIS — I10 Essential (primary) hypertension: Secondary | ICD-10-CM

## 2022-10-07 ENCOUNTER — Encounter: Payer: Self-pay | Admitting: Family Medicine

## 2022-10-07 DIAGNOSIS — F902 Attention-deficit hyperactivity disorder, combined type: Secondary | ICD-10-CM

## 2022-10-08 MED ORDER — LISDEXAMFETAMINE DIMESYLATE 70 MG PO CAPS: 70.0000 mg | ORAL_CAPSULE | Freq: Every day | ORAL | 0 refills | Status: DC

## 2022-10-31 ENCOUNTER — Encounter: Payer: Self-pay | Admitting: Family Medicine

## 2022-10-31 ENCOUNTER — Telehealth: Payer: Self-pay | Admitting: Plastic Surgery

## 2022-10-31 NOTE — Telephone Encounter (Signed)
Pt called to state she wants to put surgery on hold until next year after reading pre-op information.   Until she feels she is ready.

## 2022-11-01 ENCOUNTER — Encounter: Payer: Managed Care, Other (non HMO) | Admitting: Surgical

## 2022-11-01 ENCOUNTER — Telehealth: Payer: Self-pay | Admitting: *Deleted

## 2022-11-01 NOTE — Telephone Encounter (Signed)
LVM letting patient know I cancelled sx as she requested.

## 2022-11-04 ENCOUNTER — Telehealth (INDEPENDENT_AMBULATORY_CARE_PROVIDER_SITE_OTHER): Payer: Managed Care, Other (non HMO) | Admitting: Family Medicine

## 2022-11-04 ENCOUNTER — Encounter: Payer: Self-pay | Admitting: Family Medicine

## 2022-11-04 VITALS — BP 110/71 | HR 74 | Temp 98.5°F | Ht 67.0 in | Wt 148.0 lb

## 2022-11-04 DIAGNOSIS — Z720 Tobacco use: Secondary | ICD-10-CM | POA: Diagnosis not present

## 2022-11-04 DIAGNOSIS — F902 Attention-deficit hyperactivity disorder, combined type: Secondary | ICD-10-CM | POA: Diagnosis not present

## 2022-11-04 MED ORDER — LISDEXAMFETAMINE DIMESYLATE 70 MG PO CAPS: 70.0000 mg | ORAL_CAPSULE | Freq: Every day | ORAL | 0 refills | Status: DC

## 2022-11-04 MED ORDER — VARENICLINE TARTRATE 1 MG PO TABS: ORAL_TABLET | ORAL | 2 refills | Status: AC

## 2022-11-04 NOTE — Progress Notes (Signed)
Virtual Visit via Video Note  I connected with Glenda Fernandez on 11/04/22 at 11:10 AM EST by a video enabled telemedicine application and verified that I am speaking with the correct person using two identifiers.   I discussed the limitations of evaluation and management by telemedicine and the availability of in person appointments. The patient expressed understanding and agreed to proceed.  Patient location: at home Provider location: in office  Subjective:    CC:  No chief complaint on file.   HPI:  4 months follow up:   ADD - Reports symptoms are well controlled on current regime. Denies any problems with insomnia, chest pain, palpitations, or SOB.  Oertli on Vyvanse 70 mg daily.  We did have to substitute a 40 mg capsule at 1 point.  She is supposed to have removal of a silicone breast implant but decided to put it off until the beginning of the year.  I have encouraged her to quit smoking for the surgery and says she wants to wait until after Christmas to try to quit smoking.  She would like to use Chantix.  She says this is the first time in her life that she really feels more motivated to actually quit.   Labs was done in August.  No concerning findings at that time.  Past medical history, Surgical history, Family history not pertinant except as noted below, Social history, Allergies, and medications have been entered into the medical record, reviewed, and corrections made.    Objective:    General: Speaking clearly in complete sentences without any shortness of breath.  Alert and oriented x3.  Normal judgment. No apparent acute distress.    Impression and Recommendations:    Problem List Items Addressed This Visit       Other   Tobacco abuse - Primary    Like to try Chantix to help her quit smoking so that she can have her surgery in January.  New prescription sent to pharmacy.      Relevant Medications   varenicline (CHANTIX) 1 MG tablet   ADHD (attention  deficit hyperactivity disorder)    Well controlled. Continue current regimen. Follow up in  25mo      Relevant Medications   lisdexamfetamine (VYVANSE) 70 MG capsule (Start on 12/06/2022)   lisdexamfetamine (VYVANSE) 70 MG capsule (Start on 01/05/2023)   lisdexamfetamine (VYVANSE) 70 MG capsule (Start on 11/07/2022)    No orders of the defined types were placed in this encounter.   Meds ordered this encounter  Medications   lisdexamfetamine (VYVANSE) 70 MG capsule    Sig: Take 1 capsule (70 mg total) by mouth daily.    Dispense:  30 capsule    Refill:  0   lisdexamfetamine (VYVANSE) 70 MG capsule    Sig: Take 1 capsule (70 mg total) by mouth daily.    Dispense:  30 capsule    Refill:  0   lisdexamfetamine (VYVANSE) 70 MG capsule    Sig: Take 1 capsule (70 mg total) by mouth daily.    Dispense:  30 capsule    Refill:  0   varenicline (CHANTIX) 1 MG tablet    Sig: Take 0.5 tablets (0.5 mg total) by mouth daily for 3 days, THEN 0.5 tablets (0.5 mg total) 2 (two) times daily for 4 days, THEN 1 tablet (1 mg total) 2 (two) times daily for 23 days.    Dispense:  52 tablet    Refill:  2   We  had ordered Cologuard in July.    I discussed the assessment and treatment plan with the patient. The patient was provided an opportunity to ask questions and all were answered. The patient agreed with the plan and demonstrated an understanding of the instructions.   The patient was advised to call back or seek an in-person evaluation if the symptoms worsen or if the condition fails to improve as anticipated.  I spent 20 minutes on the day of the encounter to include pre-visit record review, face-to-face time with the patient and post visit ordering of test.   Nani Gasser, MD

## 2022-11-04 NOTE — Assessment & Plan Note (Signed)
Like to try Chantix to help her quit smoking so that she can have her surgery in January.  New prescription sent to pharmacy.

## 2022-11-04 NOTE — Assessment & Plan Note (Signed)
Well controlled. Continue current regimen. Follow up in  4 mo 

## 2022-11-13 ENCOUNTER — Ambulatory Visit (HOSPITAL_BASED_OUTPATIENT_CLINIC_OR_DEPARTMENT_OTHER)
Admission: RE | Admit: 2022-11-13 | Payer: Managed Care, Other (non HMO) | Source: Home / Self Care | Admitting: Plastic Surgery

## 2022-11-13 ENCOUNTER — Encounter (HOSPITAL_BASED_OUTPATIENT_CLINIC_OR_DEPARTMENT_OTHER): Admission: RE | Payer: Self-pay | Source: Home / Self Care

## 2022-11-13 SURGERY — REMOVAL, IMPLANT, BREAST
Anesthesia: General | Site: Breast | Laterality: Left

## 2022-11-22 ENCOUNTER — Encounter: Payer: Managed Care, Other (non HMO) | Admitting: Plastic Surgery

## 2022-12-06 ENCOUNTER — Encounter: Payer: Managed Care, Other (non HMO) | Admitting: Surgical

## 2022-12-12 ENCOUNTER — Other Ambulatory Visit: Payer: Self-pay | Admitting: Family Medicine

## 2022-12-12 DIAGNOSIS — F330 Major depressive disorder, recurrent, mild: Secondary | ICD-10-CM

## 2022-12-13 ENCOUNTER — Other Ambulatory Visit: Payer: Self-pay | Admitting: Family Medicine

## 2023-01-06 ENCOUNTER — Encounter: Payer: Self-pay | Admitting: Family Medicine

## 2023-01-06 DIAGNOSIS — F902 Attention-deficit hyperactivity disorder, combined type: Secondary | ICD-10-CM

## 2023-01-06 MED ORDER — VYVANSE 70 MG PO CAPS: 70.0000 mg | ORAL_CAPSULE | Freq: Every day | ORAL | 0 refills | Status: DC

## 2023-01-06 NOTE — Telephone Encounter (Signed)
Meds ordered this encounter  Medications   VYVANSE 70 MG capsule    Sig: Take 1 capsule (70 mg total) by mouth daily.    Dispense:  30 capsule    Refill:  0

## 2023-01-13 ENCOUNTER — Other Ambulatory Visit: Payer: Self-pay | Admitting: Family Medicine

## 2023-01-13 DIAGNOSIS — R059 Cough, unspecified: Secondary | ICD-10-CM

## 2023-02-01 ENCOUNTER — Encounter: Payer: Self-pay | Admitting: Family Medicine

## 2023-02-01 DIAGNOSIS — F902 Attention-deficit hyperactivity disorder, combined type: Secondary | ICD-10-CM

## 2023-02-01 DIAGNOSIS — J4521 Mild intermittent asthma with (acute) exacerbation: Secondary | ICD-10-CM

## 2023-02-03 ENCOUNTER — Other Ambulatory Visit: Payer: Self-pay

## 2023-02-03 DIAGNOSIS — K2100 Gastro-esophageal reflux disease with esophagitis, without bleeding: Secondary | ICD-10-CM

## 2023-02-03 MED ORDER — LISDEXAMFETAMINE DIMESYLATE 70 MG PO CAPS: 70.0000 mg | ORAL_CAPSULE | Freq: Every day | ORAL | 0 refills | Status: DC

## 2023-02-03 MED ORDER — LISDEXAMFETAMINE DIMESYLATE 20 MG PO CAPS: 20.0000 mg | ORAL_CAPSULE | Freq: Every day | ORAL | 0 refills | Status: DC

## 2023-02-03 MED ORDER — IPRATROPIUM-ALBUTEROL 0.5-2.5 (3) MG/3ML IN SOLN: 3.0000 mL | RESPIRATORY_TRACT | 0 refills | Status: DC | PRN

## 2023-02-03 MED ORDER — LISDEXAMFETAMINE DIMESYLATE 50 MG PO CAPS: 50.0000 mg | ORAL_CAPSULE | Freq: Every day | ORAL | 0 refills | Status: DC

## 2023-02-03 MED ORDER — OMEPRAZOLE 40 MG PO CPDR: DELAYED_RELEASE_CAPSULE | ORAL | 1 refills | Status: DC

## 2023-02-03 NOTE — Addendum Note (Signed)
Addended by: Rae Lips on: 02/03/2023 02:21 PM   Modules accepted: Orders

## 2023-02-03 NOTE — Telephone Encounter (Signed)
Refill of Omeprazole refilled per protocol Vyvanse 28m refill requested Last written 12/06/2022 Last OV 11/04/22 as video visit No upcoming appts scheduled.

## 2023-02-03 NOTE — Telephone Encounter (Signed)
Meds ordered this encounter  Medications   lisdexamfetamine (VYVANSE) 70 MG capsule    Sig: Take 1 capsule (70 mg total) by mouth daily.    Dispense:  30 capsule    Refill:  0   ipratropium-albuterol (DUONEB) 0.5-2.5 (3) MG/3ML SOLN    Sig: Take 3 mLs by nebulization every 4 (four) hours as needed.    Dispense:  360 mL    Refill:  0   lisdexamfetamine (VYVANSE) 50 MG capsule    Sig: Take 1 capsule (50 mg total) by mouth daily.    Dispense:  30 capsule    Refill:  0   lisdexamfetamine (VYVANSE) 20 MG capsule    Sig: Take 1 capsule (20 mg total) by mouth daily.    Dispense:  30 capsule    Refill:  0

## 2023-02-03 NOTE — Telephone Encounter (Signed)
Forwarding message from patient requesting to change vyvanse to 68m and 544m due to vyvanse 7052mhortage.   He also is requesting a rx rf of ipratropium /albuterol   Last filled 03/16/2018

## 2023-02-03 NOTE — Telephone Encounter (Signed)
Forwarded message to Dr. Madilyn Fireman for review.

## 2023-02-03 NOTE — Addendum Note (Signed)
Addended by: [name] D on: 02/03/2023 03:53 PM   Modules accepted: Orders

## 2023-02-04 ENCOUNTER — Encounter: Payer: Self-pay | Admitting: Family Medicine

## 2023-02-04 MED ORDER — VYVANSE 20 MG PO CAPS: 20.0000 mg | ORAL_CAPSULE | Freq: Every day | ORAL | 0 refills | Status: DC

## 2023-02-04 MED ORDER — VYVANSE 50 MG PO CAPS: 50.0000 mg | ORAL_CAPSULE | Freq: Every day | ORAL | 0 refills | Status: DC

## 2023-02-04 NOTE — Telephone Encounter (Signed)
We can try but her insurance may not cover brand

## 2023-02-09 ENCOUNTER — Other Ambulatory Visit: Payer: Self-pay | Admitting: Family Medicine

## 2023-02-27 ENCOUNTER — Telehealth: Payer: Managed Care, Other (non HMO) | Admitting: Family Medicine

## 2023-02-27 ENCOUNTER — Encounter: Payer: Self-pay | Admitting: Family Medicine

## 2023-02-27 VITALS — BP 115/75 | HR 91 | Temp 98.9°F | Wt 146.5 lb

## 2023-02-27 DIAGNOSIS — I10 Essential (primary) hypertension: Secondary | ICD-10-CM | POA: Diagnosis not present

## 2023-02-27 DIAGNOSIS — F902 Attention-deficit hyperactivity disorder, combined type: Secondary | ICD-10-CM | POA: Diagnosis not present

## 2023-02-27 MED ORDER — VYVANSE 50 MG PO CAPS: 50.0000 mg | ORAL_CAPSULE | Freq: Every day | ORAL | 0 refills | Status: DC

## 2023-02-27 MED ORDER — VYVANSE 20 MG PO CAPS: 20.0000 mg | ORAL_CAPSULE | Freq: Every day | ORAL | 0 refills | Status: DC

## 2023-02-27 NOTE — Progress Notes (Signed)
    Virtual Visit via Video Note  I connected with Glenda Fernandez on 02/27/23 at  3:20 PM EDT by a video enabled telemedicine application and verified that I am speaking with the correct person using two identifiers.   I discussed the limitations of evaluation and management by telemedicine and the availability of in person appointments. The patient expressed understanding and agreed to proceed.  Patient location: at home Provider location: in office  Subjective:    CC:   Chief Complaint  Patient presents with   ADHD    HPI: Pt is following up for refills on Vyvanse. She would like to do the 50 and 20 mg due to their being a shortage on the 70's   Hypertension- Pt denies chest pain, SOB, dizziness, or heart palpitations.  Taking meds as directed w/o problems.  Denies medication side effects.    She is going to finally be able to get a new car may not have to use over for transportation to doctors appointments and to help take her grandson to his therapy sessions.  She is pretty excited about it.  Past medical history, Surgical history, Family history not pertinant except as noted below, Social history, Allergies, and medications have been entered into the medical record, reviewed, and corrections made.    Objective:    General: Speaking clearly in complete sentences without any shortness of breath.  Alert and oriented x3.  Normal judgment. No apparent acute distress.    Impression and Recommendations:    Problem List Items Addressed This Visit       Cardiovascular and Mediastinum   Essential hypertension    Blood pressures continue to be well-controlled.  Hopefully will be able to see her in person next time.  Did strongly encouraged her to try to come in in the next month for blood work if at all possible.      Relevant Orders   BASIC METABOLIC PANEL WITH GFR   TSH     Other   ADHD (attention deficit hyperactivity disorder) - Primary    Well controlled. Continue  current regimen. Follow up in 3-4 months.       Relevant Medications   VYVANSE 20 MG capsule   VYVANSE 50 MG capsule   Other Relevant Orders   BASIC METABOLIC PANEL WITH GFR   TSH    Orders Placed This Encounter  Procedures   BASIC METABOLIC PANEL WITH GFR   TSH    Meds ordered this encounter  Medications   VYVANSE 20 MG capsule    Sig: Take 1 capsule (20 mg total) by mouth daily.    Dispense:  30 capsule    Refill:  0   VYVANSE 50 MG capsule    Sig: Take 1 capsule (50 mg total) by mouth daily.    Dispense:  30 capsule    Refill:  0     I discussed the assessment and treatment plan with the patient. The patient was provided an opportunity to ask questions and all were answered. The patient agreed with the plan and demonstrated an understanding of the instructions.   The patient was advised to call back or seek an in-person evaluation if the symptoms worsen or if the condition fails to improve as anticipated.   Glenda Lecher, MD

## 2023-02-27 NOTE — Assessment & Plan Note (Signed)
Blood pressures continue to be well-controlled.  Hopefully will be able to see her in person next time.  Did strongly encouraged her to try to come in in the next month for blood work if at all possible.

## 2023-02-27 NOTE — Progress Notes (Signed)
Pt is following up for refills on Vyvanse. She would like to do the 50 and 20 mg due to their being a shortage on the 70's

## 2023-02-27 NOTE — Assessment & Plan Note (Signed)
Well controlled. Continue current regimen. Follow up in  3-4 months.  

## 2023-03-03 ENCOUNTER — Other Ambulatory Visit: Payer: Self-pay | Admitting: Family Medicine

## 2023-03-03 DIAGNOSIS — J4521 Mild intermittent asthma with (acute) exacerbation: Secondary | ICD-10-CM

## 2023-03-07 ENCOUNTER — Other Ambulatory Visit: Payer: Self-pay | Admitting: Family Medicine

## 2023-03-07 DIAGNOSIS — I10 Essential (primary) hypertension: Secondary | ICD-10-CM

## 2023-04-04 ENCOUNTER — Other Ambulatory Visit: Payer: Self-pay | Admitting: Family Medicine

## 2023-04-04 DIAGNOSIS — J4521 Mild intermittent asthma with (acute) exacerbation: Secondary | ICD-10-CM

## 2023-05-22 ENCOUNTER — Encounter: Payer: Self-pay | Admitting: Family Medicine

## 2023-05-22 DIAGNOSIS — R928 Other abnormal and inconclusive findings on diagnostic imaging of breast: Secondary | ICD-10-CM

## 2023-05-24 NOTE — Telephone Encounter (Signed)
OK to order diag mammo. Thank you

## 2023-05-30 ENCOUNTER — Encounter: Payer: Self-pay | Admitting: Family Medicine

## 2023-05-30 MED ORDER — LISDEXAMFETAMINE DIMESYLATE 70 MG PO CAPS: 70.0000 mg | ORAL_CAPSULE | Freq: Every day | ORAL | 0 refills | Status: DC

## 2023-05-30 NOTE — Addendum Note (Signed)
Addended by: Chalmers Cater on: 05/30/2023 03:48 PM   Modules accepted: Orders

## 2023-06-02 ENCOUNTER — Encounter: Payer: Self-pay | Admitting: Family Medicine

## 2023-06-02 MED ORDER — VYVANSE 70 MG PO CAPS: 70.0000 mg | ORAL_CAPSULE | Freq: Every day | ORAL | 0 refills | Status: DC

## 2023-06-05 ENCOUNTER — Other Ambulatory Visit: Payer: Self-pay | Admitting: Family Medicine

## 2023-06-05 DIAGNOSIS — R059 Cough, unspecified: Secondary | ICD-10-CM

## 2023-06-17 ENCOUNTER — Encounter: Payer: Self-pay | Admitting: Family Medicine

## 2023-06-17 ENCOUNTER — Other Ambulatory Visit: Payer: Self-pay | Admitting: Family Medicine

## 2023-06-17 ENCOUNTER — Telehealth: Payer: Managed Care, Other (non HMO) | Admitting: Family Medicine

## 2023-06-17 DIAGNOSIS — Z720 Tobacco use: Secondary | ICD-10-CM

## 2023-06-17 DIAGNOSIS — F17210 Nicotine dependence, cigarettes, uncomplicated: Secondary | ICD-10-CM

## 2023-06-17 DIAGNOSIS — G2581 Restless legs syndrome: Secondary | ICD-10-CM

## 2023-06-17 DIAGNOSIS — F902 Attention-deficit hyperactivity disorder, combined type: Secondary | ICD-10-CM | POA: Diagnosis not present

## 2023-06-17 DIAGNOSIS — I10 Essential (primary) hypertension: Secondary | ICD-10-CM | POA: Diagnosis not present

## 2023-06-17 DIAGNOSIS — F1111 Opioid abuse, in remission: Secondary | ICD-10-CM

## 2023-06-17 DIAGNOSIS — F330 Major depressive disorder, recurrent, mild: Secondary | ICD-10-CM

## 2023-06-17 LAB — BASIC METABOLIC PANEL WITH GFR
BUN/Creatinine Ratio: 21 (calc) (ref 6–22)
BUN: 25 mg/dL (ref 7–25)
CO2: 24 mmol/L (ref 20–32)
Calcium: 9.2 mg/dL (ref 8.6–10.4)
Chloride: 106 mmol/L (ref 98–110)
Creat: 1.21 mg/dL — ABNORMAL HIGH (ref 0.50–1.05)
Glucose, Bld: 80 mg/dL (ref 65–99)
Potassium: 4.4 mmol/L (ref 3.5–5.3)
Sodium: 140 mmol/L (ref 135–146)
eGFR: 50 mL/min/{1.73_m2} — ABNORMAL LOW (ref >=60)

## 2023-06-17 LAB — TSH: TSH: 0.82 mIU/L (ref 0.40–4.50)

## 2023-06-17 MED ORDER — VYVANSE 70 MG PO CAPS
70.0000 mg | ORAL_CAPSULE | Freq: Every day | ORAL | 0 refills | Status: DC
Start: 2023-07-30 — End: 2024-01-15

## 2023-06-17 MED ORDER — VYVANSE 70 MG PO CAPS
70.0000 mg | ORAL_CAPSULE | Freq: Every day | ORAL | 0 refills | Status: DC
Start: 2023-06-30 — End: 2023-09-29

## 2023-06-17 MED ORDER — VYVANSE 70 MG PO CAPS
70.0000 mg | ORAL_CAPSULE | Freq: Every day | ORAL | 0 refills | Status: DC
Start: 2023-08-29 — End: 2023-09-25

## 2023-06-17 MED ORDER — HYDROXYZINE HCL 10 MG PO TABS: ORAL_TABLET | ORAL | 1 refills | Status: DC

## 2023-06-17 NOTE — Assessment & Plan Note (Signed)
Well controlled. Continue current regimen. Follow up in  3 months.  

## 2023-06-17 NOTE — Progress Notes (Signed)
Called pt and lvm advising pt that Dr. Linford Arnold was running behind and that we would contact her

## 2023-06-17 NOTE — Assessment & Plan Note (Signed)
Offered to write Chantix for her if at some point she is ready and willing to quit.

## 2023-06-17 NOTE — Assessment & Plan Note (Signed)
Checking home BPS and she reports well controlled.

## 2023-06-17 NOTE — Progress Notes (Signed)
Virtual Visit via Video Note  I connected with [name] on 06/17/23 at 10:50 AM EDT by a video enabled telemedicine application and verified that I am speaking with the correct person using two identifiers.   I discussed the limitations of evaluation and management by telemedicine and the availability of in person appointments. The patient expressed understanding and agreed to proceed.  Patient location: at home Provider location: in office  Subjective:    CC:  No chief complaint on file.   HPI:  ADD - Reports symptoms are well controlled on current regime. Denies any problems with insomnia, chest pain, palpitations, or SOB.    Planning on having surgery to remove the breast implant next months. Due for additional imaging.  Thinking about getting a 2nd opinion about the breast surgery.  They did tell her that she would need to quit smoking before the surgery and she is still smoking though she knows she needs to quit.  Has been stressful in her house.    Had labs drawn yesterday.    Grandson got tested for celiac he is initially positive but they are going to rescreen him and if he is still positive then they are recommending that family members get tested as well.   Past medical history, Surgical history, Family history not pertinant except as noted below, Social history, Allergies, and medications have been entered into the medical record, reviewed, and corrections made.    Objective:    General: Speaking clearly in complete sentences without any shortness of breath.  Alert and oriented x3.  Normal judgment. No apparent acute distress.    Impression and Recommendations:    Problem List Items Addressed This Visit       Cardiovascular and Mediastinum   Essential hypertension    Checking home BPS and she reports well controlled.         Other   Tobacco abuse    Offered to write Chantix for her if at some point she is ready and willing to quit.       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      Mild episode of recurrent major depressive disorder (HCC)   Relevant Medications   hydrOXYzine (ATARAX) 10 MG tablet   ADHD (attention deficit hyperactivity disorder) - Primary    Well controlled. Continue current regimen. Follow up in  3 months.       Relevant Medications   VYVANSE 70 MG capsule (Start on 08/29/2023)   VYVANSE 70 MG capsule (Start on 07/30/2023)   VYVANSE 70 MG capsule (Start on 06/30/2023)     Reviewed labs today.     No orders of the defined types were placed in this encounter.   Meds ordered this encounter  Medications   VYVANSE 70 MG capsule    Sig: Take 1 capsule (70 mg total) by mouth daily.    Dispense:  30 capsule    Refill:  0   hydrOXYzine (ATARAX) 10 MG tablet    Sig: TAKE 1 TO 2 TABLETS BY MOUTH EVERY 8 HOURS AS NEEDED FOR ANXIETY    Dispense:  45 tablet    Refill:  1   VYVANSE 70 MG capsule    Sig: Take 1 capsule (70 mg total) by mouth daily.    Dispense:  30 capsule    Refill:  0   VYVANSE 70 MG capsule    Sig: Take 1 capsule (70 mg total) by mouth  daily.    Dispense:  30 capsule    Refill:  0     I discussed the assessment and treatment plan with the patient. The patient was provided an opportunity to ask questions and all were answered. The patient agreed with the plan and demonstrated an understanding of the instructions.   The patient was advised to call back or seek an in-person evaluation if the symptoms worsen or if the condition fails to improve as anticipated.  I spent 20 minutes on the day of the encounter to include pre-visit record review, face-to-face time with the patient and post visit ordering of test.  Nani Gasser, MD

## 2023-06-17 NOTE — Assessment & Plan Note (Signed)
Stable and doing well. 

## 2023-06-17 NOTE — Progress Notes (Signed)
Your lab work is within acceptable range and there are no concerning findings.   ?

## 2023-06-19 ENCOUNTER — Other Ambulatory Visit: Payer: Self-pay | Admitting: Family Medicine

## 2023-06-19 DIAGNOSIS — F330 Major depressive disorder, recurrent, mild: Secondary | ICD-10-CM

## 2023-08-01 ENCOUNTER — Other Ambulatory Visit: Payer: Self-pay | Admitting: Family Medicine

## 2023-08-01 DIAGNOSIS — K2100 Gastro-esophageal reflux disease with esophagitis, without bleeding: Secondary | ICD-10-CM

## 2023-08-22 ENCOUNTER — Encounter: Payer: Self-pay | Admitting: Family Medicine

## 2023-08-22 DIAGNOSIS — R928 Other abnormal and inconclusive findings on diagnostic imaging of breast: Secondary | ICD-10-CM

## 2023-08-25 NOTE — Telephone Encounter (Signed)
Ok for referral?

## 2023-09-05 ENCOUNTER — Other Ambulatory Visit: Payer: Self-pay | Admitting: Family Medicine

## 2023-09-05 DIAGNOSIS — L989 Disorder of the skin and subcutaneous tissue, unspecified: Secondary | ICD-10-CM

## 2023-09-05 DIAGNOSIS — N644 Mastodynia: Secondary | ICD-10-CM

## 2023-09-05 DIAGNOSIS — R928 Other abnormal and inconclusive findings on diagnostic imaging of breast: Secondary | ICD-10-CM

## 2023-09-11 ENCOUNTER — Other Ambulatory Visit: Payer: Managed Care, Other (non HMO)

## 2023-09-11 ENCOUNTER — Ambulatory Visit
Admission: RE | Admit: 2023-09-11 | Discharge: 2023-09-11 | Disposition: A | Discharge status: Home / Self Care | Payer: Managed Care, Other (non HMO) | Source: Ambulatory Visit | Attending: Family Medicine | Admitting: Family Medicine

## 2023-09-11 DIAGNOSIS — N644 Mastodynia: Secondary | ICD-10-CM

## 2023-09-11 DIAGNOSIS — L989 Disorder of the skin and subcutaneous tissue, unspecified: Secondary | ICD-10-CM

## 2023-09-14 ENCOUNTER — Other Ambulatory Visit: Payer: Self-pay | Admitting: Family Medicine

## 2023-09-14 DIAGNOSIS — I10 Essential (primary) hypertension: Secondary | ICD-10-CM

## 2023-09-24 ENCOUNTER — Encounter: Payer: Self-pay | Admitting: Family Medicine

## 2023-09-24 DIAGNOSIS — F902 Attention-deficit hyperactivity disorder, combined type: Secondary | ICD-10-CM

## 2023-09-25 MED ORDER — VYVANSE 70 MG PO CAPS
70.0000 mg | ORAL_CAPSULE | Freq: Every day | ORAL | 0 refills | Status: DC
Start: 2023-11-23 — End: 2023-12-24

## 2023-09-25 NOTE — Telephone Encounter (Signed)
Meds ordered this encounter  Medications   VYVANSE 70 MG capsule    Sig: Take 1 capsule (70 mg total) by mouth daily.    Dispense:  30 capsule    Refill:  0    

## 2023-09-29 MED ORDER — VYVANSE 70 MG PO CAPS: 70.0000 mg | ORAL_CAPSULE | Freq: Every day | ORAL | 0 refills | Status: DC

## 2023-09-30 ENCOUNTER — Telehealth (INDEPENDENT_AMBULATORY_CARE_PROVIDER_SITE_OTHER): Payer: Managed Care, Other (non HMO) | Admitting: Family Medicine

## 2023-09-30 ENCOUNTER — Encounter: Payer: Self-pay | Admitting: Family Medicine

## 2023-09-30 DIAGNOSIS — F330 Major depressive disorder, recurrent, mild: Secondary | ICD-10-CM

## 2023-09-30 DIAGNOSIS — F909 Attention-deficit hyperactivity disorder, unspecified type: Secondary | ICD-10-CM

## 2023-09-30 DIAGNOSIS — F902 Attention-deficit hyperactivity disorder, combined type: Secondary | ICD-10-CM

## 2023-09-30 DIAGNOSIS — J4521 Mild intermittent asthma with (acute) exacerbation: Secondary | ICD-10-CM

## 2023-09-30 NOTE — Assessment & Plan Note (Signed)
Courage her to try to connect with a new therapist.  I do think she has some PTSD so encouraged her to maybe specifically ask for somebody with some PTSD experience.  Continue with fluoxetine 40 mg daily.

## 2023-09-30 NOTE — Progress Notes (Signed)
    Virtual Visit via Video Note  I connected with Glenda Fernandez on 09/30/23 at  3:20 PM EDT by a video enabled telemedicine application and verified that I am speaking with the correct person using two identifiers.   I discussed the limitations of evaluation and management by telemedicine and the availability of in person appointments. The patient expressed understanding and agreed to proceed.  Patient location: at home Provider location: in office  Subjective:    CC:  No chief complaint on file.   HPI: ADD - Reports symptoms are well controlled on current regime. Denies any problems with insomnia, chest pain, palpitations, or SOB.    She did reach out to connect with a therapist.  She met with them once and they recommended that she might be better serviced by different therapist who could meet with her biweekly.  She has not had a chance to look and try to find a new therapist.  Follow-up asthma-she is doing well overall just occasionally uses her HFA though she has not had to use her nebulizer at all recently.  Her husband did quit smoking about 2 months ago.  Does have her upcoming appoint with the plastic surgeon this month and is hoping to go ahead and get the breast implant removed before the end of the year.  Get did get her mammogram updated.   Past medical history, Surgical history, Family history not pertinant except as noted below, Social history, Allergies, and medications have been entered into the medical record, reviewed, and corrections made.    Objective:    General: Speaking clearly in complete sentences without any shortness of breath.  Alert and oriented x3.  Normal judgment. No apparent acute distress.    Impression and Recommendations:    Problem List Items Addressed This Visit       Respiratory   Mild intermittent asthma with acute exacerbation - Primary    Continue albuterol as needed.        Other   Mild episode of recurrent major depressive  disorder [facility])    Courage her to try to connect with a new therapist.  I do think she has some PTSD so encouraged her to maybe specifically ask for somebody with some PTSD experience.  Continue with fluoxetine 40 mg daily.      ADHD (attention deficit hyperactivity disorder)    Continue current regimen.       No orders of the defined types were placed in this encounter.   No orders of the defined types were placed in this encounter.    I discussed the assessment and treatment plan with the patient. The patient was provided an opportunity to ask questions and all were answered. The patient agreed with the plan and demonstrated an understanding of the instructions.   The patient was advised to call back or seek an in-person evaluation if the symptoms worsen or if the condition fails to improve as anticipated.   Nani Gasser, MD

## 2023-09-30 NOTE — Assessment & Plan Note (Signed)
Continue albuterol as needed 

## 2023-09-30 NOTE — Assessment & Plan Note (Signed)
Continue current regimen

## 2023-10-01 ENCOUNTER — Institutional Professional Consult (permissible substitution): Payer: Managed Care, Other (non HMO) | Admitting: Plastic Surgery

## 2023-10-29 ENCOUNTER — Encounter: Payer: Self-pay | Admitting: Family Medicine

## 2023-10-29 DIAGNOSIS — F902 Attention-deficit hyperactivity disorder, combined type: Secondary | ICD-10-CM

## 2023-10-30 ENCOUNTER — Other Ambulatory Visit: Payer: Self-pay | Admitting: Family Medicine

## 2023-10-30 DIAGNOSIS — F330 Major depressive disorder, recurrent, mild: Secondary | ICD-10-CM

## 2023-10-30 DIAGNOSIS — K2100 Gastro-esophageal reflux disease with esophagitis, without bleeding: Secondary | ICD-10-CM

## 2023-10-30 MED ORDER — VYVANSE 70 MG PO CAPS
70.0000 mg | ORAL_CAPSULE | Freq: Every day | ORAL | 0 refills | Status: DC
Start: 2023-10-30 — End: 2024-01-15

## 2023-12-18 ENCOUNTER — Other Ambulatory Visit: Payer: Self-pay | Admitting: Family Medicine

## 2023-12-18 DIAGNOSIS — I10 Essential (primary) hypertension: Secondary | ICD-10-CM

## 2023-12-23 ENCOUNTER — Encounter: Payer: Self-pay | Admitting: Family Medicine

## 2023-12-23 DIAGNOSIS — F902 Attention-deficit hyperactivity disorder, combined type: Secondary | ICD-10-CM

## 2023-12-24 MED ORDER — VYVANSE 70 MG PO CAPS
70.0000 mg | ORAL_CAPSULE | Freq: Every day | ORAL | 0 refills | Status: DC
Start: 2023-12-24 — End: 2024-01-15

## 2023-12-30 ENCOUNTER — Ambulatory Visit: Payer: Managed Care, Other (non HMO) | Admitting: Family Medicine

## 2024-01-14 ENCOUNTER — Other Ambulatory Visit: Payer: Self-pay | Admitting: Family Medicine

## 2024-01-14 DIAGNOSIS — R059 Cough, unspecified: Secondary | ICD-10-CM

## 2024-01-15 ENCOUNTER — Telehealth (INDEPENDENT_AMBULATORY_CARE_PROVIDER_SITE_OTHER): Payer: Managed Care, Other (non HMO) | Admitting: Family Medicine

## 2024-01-15 ENCOUNTER — Encounter: Payer: Self-pay | Admitting: Family Medicine

## 2024-01-15 ENCOUNTER — Ambulatory Visit: Payer: Managed Care, Other (non HMO) | Admitting: Family Medicine

## 2024-01-15 DIAGNOSIS — M79605 Pain in left leg: Secondary | ICD-10-CM | POA: Diagnosis not present

## 2024-01-15 DIAGNOSIS — I10 Essential (primary) hypertension: Secondary | ICD-10-CM

## 2024-01-15 DIAGNOSIS — F902 Attention-deficit hyperactivity disorder, combined type: Secondary | ICD-10-CM | POA: Diagnosis not present

## 2024-01-15 MED ORDER — VYVANSE 70 MG PO CAPS: 70.0000 mg | ORAL_CAPSULE | Freq: Every day | ORAL | 0 refills | Status: DC

## 2024-01-15 MED ORDER — PREDNISONE 20 MG PO TABS: 40.0000 mg | ORAL_TABLET | Freq: Every day | ORAL | 0 refills | Status: DC

## 2024-01-15 MED ORDER — VYVANSE 70 MG PO CAPS
70.0000 mg | ORAL_CAPSULE | Freq: Every day | ORAL | 0 refills | Status: DC
Start: 2024-02-17 — End: 2024-05-04

## 2024-01-15 NOTE — Assessment & Plan Note (Signed)
Doing well with current regimen of Vyvanse needs new prescriptions updated and sent to pharmacy.

## 2024-01-15 NOTE — Assessment & Plan Note (Signed)
She was unable to make the appointment in person.  I just reminded her that we do need to get her in in person the last time we got vitals and blood pressure was in March of this past year.  She still has not had a chance to come by and get blood work she says she will try to do it either this Friday or next Friday.

## 2024-01-15 NOTE — Addendum Note (Signed)
Addended by: Nani Gasser D on: 01/15/2024 05:27 PM   Modules accepted: Orders

## 2024-01-15 NOTE — Progress Notes (Signed)
LVM advising pt that I was calling to do her prescreening. Asked that she stay signed into my chart and that Dr. Linford Arnold would be with her soon.

## 2024-01-15 NOTE — Progress Notes (Addendum)
Virtual Visit via Video Note  I connected with Glenda Fernandez on 01/15/24 at  2:00 PM EST by a video enabled telemedicine application and verified that I am speaking with the correct person using two identifiers.   I discussed the limitations of evaluation and management by telemedicine and the availability of in person appointments. The patient expressed understanding and agreed to proceed.  Patient location: at home Provider location: in office  Subjective:    CC:  No chief complaint on file.   HPI: Home has been somewhat stressful. Her grandson who has pretty severe autism and is nonverbal lives in the home with her as well as her daughters.  She does not have a car right now so was unable to make it in person for her appointment.  She has not been able to come in for her blood work yet.  He is also been struggling with what she describes as "sciatica" she said she has had an issue with it before but recently she started having pain in her left low back radiating down the back of her leg.  She says the last couple of nights its actually been painful enough it has been hard to sleep and rest.  She says it is worse with sitting standing or laying down but better if she walks.  She has been mostly just taking ibuprofen and says it only provided a minimal amount of relief.  It started about a week ago.   Past medical history, Surgical history, Family history not pertinant except as noted below, Social history, Allergies, and medications have been entered into the medical record, reviewed, and corrections made.    Objective:    General: Speaking clearly in complete sentences without any shortness of breath.  Alert and oriented x3.  Normal judgment. No apparent acute distress.    Impression and Recommendations:    Problem List Items Addressed This Visit       Cardiovascular and Mediastinum   Essential hypertension - Primary   She was unable to make the appointment in person.  I  just reminded her that we do need to get her in in person the last time we got vitals and blood pressure was in March of this past year.  She still has not had a chance to come by and get blood work she says she will try to do it either this Friday or next Friday.        Other   ADHD (attention deficit hyperactivity disorder)   Doing well with current regimen of Vyvanse needs new prescriptions updated and sent to pharmacy.      Relevant Medications   VYVANSE 70 MG capsule (Start on 01/21/2024)   VYVANSE 70 MG capsule (Start on 02/17/2024)   VYVANSE 70 MG capsule (Start on 03/18/2024)   Other Visit Diagnoses       Left leg pain       Relevant Medications   predniSONE (DELTASONE) 20 MG tablet       Left leg pain-possible sciatica.  Will treat with prednisone.  Will just need to avoid NSAIDs while on the prednisone burst.  If not improving over the next couple of weeks then recommend that she come in for formal evaluation and appointment.  Work on gentle stretches.  Avoid heavy lifting.  No orders of the defined types were placed in this encounter.   Meds ordered this encounter  Medications   VYVANSE 70 MG capsule    Sig: Take  1 capsule (70 mg total) by mouth daily.    Dispense:  30 capsule    Refill:  0   VYVANSE 70 MG capsule    Sig: Take 1 capsule (70 mg total) by mouth daily.    Dispense:  30 capsule    Refill:  0   VYVANSE 70 MG capsule    Sig: Take 1 capsule (70 mg total) by mouth daily.    Dispense:  30 capsule    Refill:  0   predniSONE (DELTASONE) 20 MG tablet    Sig: Take 2 tablets (40 mg total) by mouth daily with breakfast.    Dispense:  10 tablet    Refill:  0     I discussed the assessment and treatment plan with the patient. The patient was provided an opportunity to ask questions and all were answered. The patient agreed with the plan and demonstrated an understanding of the instructions.   The patient was advised to call back or seek an in-person evaluation  if the symptoms worsen or if the condition fails to improve as anticipated.   Nani Gasser, MD

## 2024-01-17 ENCOUNTER — Other Ambulatory Visit: Payer: Self-pay | Admitting: Family Medicine

## 2024-01-17 DIAGNOSIS — I10 Essential (primary) hypertension: Secondary | ICD-10-CM

## 2024-01-19 NOTE — Telephone Encounter (Signed)
Called patient LVM to call office to schedule appointment, thanks.

## 2024-01-19 NOTE — Telephone Encounter (Signed)
Pls contact the pt to schedule NV for HTN. No additional medication refills. Thx

## 2024-02-03 ENCOUNTER — Ambulatory Visit: Payer: Managed Care, Other (non HMO) | Admitting: Family Medicine

## 2024-02-04 ENCOUNTER — Ambulatory Visit (INDEPENDENT_AMBULATORY_CARE_PROVIDER_SITE_OTHER): Payer: Managed Care, Other (non HMO) | Admitting: Family Medicine

## 2024-02-04 ENCOUNTER — Ambulatory Visit: Payer: Managed Care, Other (non HMO)

## 2024-02-04 VITALS — BP 125/46 | HR 76 | Ht 64.5 in | Wt 135.0 lb

## 2024-02-04 DIAGNOSIS — M533 Sacrococcygeal disorders, not elsewhere classified: Secondary | ICD-10-CM | POA: Diagnosis not present

## 2024-02-04 DIAGNOSIS — F902 Attention-deficit hyperactivity disorder, combined type: Secondary | ICD-10-CM

## 2024-02-04 DIAGNOSIS — M79605 Pain in left leg: Secondary | ICD-10-CM

## 2024-02-04 DIAGNOSIS — Z23 Encounter for immunization: Secondary | ICD-10-CM

## 2024-02-04 DIAGNOSIS — I10 Essential (primary) hypertension: Secondary | ICD-10-CM | POA: Diagnosis not present

## 2024-02-04 DIAGNOSIS — Z720 Tobacco use: Secondary | ICD-10-CM

## 2024-02-04 DIAGNOSIS — E7800 Pure hypercholesterolemia, unspecified: Secondary | ICD-10-CM

## 2024-02-04 MED ORDER — ROSUVASTATIN CALCIUM 10 MG PO TABS: 10.0000 mg | ORAL_TABLET | Freq: Every day | ORAL | 3 refills | Status: AC

## 2024-02-04 MED ORDER — LISINOPRIL-HYDROCHLOROTHIAZIDE 20-25 MG PO TABS: 1.0000 | ORAL_TABLET | Freq: Every day | ORAL | 2 refills | Status: DC

## 2024-02-04 NOTE — Assessment & Plan Note (Signed)
 Well controlled. Continue current regimen. Follow up in  6 mo

## 2024-02-04 NOTE — Progress Notes (Signed)
Established Patient Office Visit  Subjective  Patient ID: Glenda Fernandez, female    DOB: [birth year]  Age: 63 y.o. MRN: [id number]  Chief Complaint  Patient presents with   Cough    C/o lingering cough x 3 weeks   sciatica pain    Patient c/o sciatica pain - was given prednisone that has been completed. Was scheduled with Dr. Karie Schwalbe tomorrow at 3:30pm but worried that weather would not allow her to keep this appt - patient also concerned about bone protrusion in sacrum area.    HPI  Lingering cough and would like her lungs checked.  Congestion She had fever cough to me to go.  Has been tested positive for the flu.  She herself ran a fever for several days.  Overall she is better but still has a little bit of lingering cough in her upper chest and a lot of sinus drainage.  Left sided sciatica pain.  Still having pain going down into that left leg from the posterior buttock area to her knees sometimes even all the way down to her foot will go numb.  She did take the prednisone but is still having a lot of discomfort she has scheduled with sports med tomorrow but is worried that the appointment may be canceled because of the weather.    ROS    Objective:     BP (!) 125/46   Pulse 76   Ht 5' 4.5" (1.638 m)   Wt 135 lb (61.2 kg)   SpO2 100%   BMI 22.81 kg/m    Physical Exam Constitutional:      Appearance: Normal appearance.  HENT:     Head: Normocephalic and atraumatic.     Right Ear: Tympanic membrane, ear canal and external ear normal. There is no impacted cerumen.     Left Ear: Tympanic membrane, ear canal and external ear normal. There is no impacted cerumen.     Nose: Nose normal.     Mouth/Throat:     Pharynx: Oropharynx is clear.  Eyes:     Conjunctiva/sclera: Conjunctivae normal.  Cardiovascular:     Rate and Rhythm: Normal rate and regular rhythm.  Pulmonary:     Effort: Pulmonary effort is normal.     Breath sounds: Normal breath sounds.  Musculoskeletal:      Cervical back: Neck supple. No tenderness.     Comments: Nontender over the SI joints.  Negative straight leg raise bilaterally.  Hip, knee, ankle strength is 5 out of 5.  Patellar reflexes 2+ bilaterally.  Lymphadenopathy:     Cervical: No cervical adenopathy.  Skin:    General: Skin is warm and dry.  Neurological:     Mental Status: She is alert and oriented to person, place, and time.  Psychiatric:        Mood and Affect: Mood normal.      No results found for any visits on 02/04/24.    The 10-year ASCVD risk score (Arnett DK, et al., 2019) is: 11%    Assessment & Plan:   Problem List Items Addressed This Visit       Cardiovascular and Mediastinum   Essential hypertension - Primary   Well controlled. Continue current regimen. Follow up in  6mo       Relevant Medications   rosuvastatin (CRESTOR) 10 MG tablet   lisinopril-hydrochlorothiazide (ZESTORETIC) 20-25 MG tablet   Other Relevant Orders   CMP14+EGFR   Lipid panel   CBC  Other   Tobacco abuse   Encouraged smoking cessation.  Will get cxr if cough not improving over next week.       Hyperlipidemia   Due to recheck lipoiods.       Relevant Medications   rosuvastatin (CRESTOR) 10 MG tablet   lisinopril-hydrochlorothiazide (ZESTORETIC) 20-25 MG tablet   ADHD (attention deficit hyperactivity disorder)   Other Visit Diagnoses       Left leg pain       Relevant Orders   DG Lumbar Spine Complete   DG Sacrum/Coccyx     Sacral pain       Relevant Orders   DG Sacrum/Coccyx     Encounter for immunization       Relevant Orders   Pneumococcal conjugate vaccine 20-valent (Completed)      Left leg pain-most consistent with sciatica.  Did not get significant improvement with prednisone.  Will give handout for exercises to do on her own at home in addition to getting some plain film x-rays for further workup today.  Also some asymmetry and protrusion of the coccyx and sacral bone particularly on that  right side 2 so we will get plain film of that to suspect there may just be some arthritis there.  She did have a history of low back surgery when she was much younger.  Prevnar 20 given today.    No follow-ups on file.    Nani Gasser, MD

## 2024-02-04 NOTE — Assessment & Plan Note (Signed)
 Due to recheck lipoiods.

## 2024-02-04 NOTE — Assessment & Plan Note (Signed)
Encouraged smoking cessation.  Will get cxr if cough not improving over next week.

## 2024-02-05 ENCOUNTER — Ambulatory Visit: Payer: Managed Care, Other (non HMO) | Admitting: Sports Medicine

## 2024-02-05 ENCOUNTER — Encounter: Payer: Self-pay | Admitting: Family Medicine

## 2024-02-05 LAB — LIPID PANEL
Chol/HDL Ratio: 4 {ratio} (ref 0.0–4.4)
Cholesterol, Total: 160 mg/dL (ref 100–199)
HDL: 40 mg/dL (ref >39)
LDL Chol Calc (NIH): 89 mg/dL (ref 0–99)
Triglycerides: 180 mg/dL — ABNORMAL HIGH (ref 0–149)
VLDL Cholesterol Cal: 31 mg/dL (ref 5–40)

## 2024-02-05 LAB — CBC
Hematocrit: 34.1 % (ref 34.0–46.6)
Hemoglobin: 11.2 g/dL (ref 11.1–15.9)
MCH: 29.8 pg (ref 26.6–33.0)
MCHC: 32.8 g/dL (ref 31.5–35.7)
MCV: 91 fL (ref 79–97)
Platelets: 438 10*3/uL (ref 150–450)
RBC: 3.76 x10E6/uL — ABNORMAL LOW (ref 3.77–5.28)
RDW: 12.6 % (ref 11.7–15.4)
WBC: 7.8 10*3/uL (ref 3.4–10.8)

## 2024-02-05 LAB — CMP14+EGFR
ALT: 9 [IU]/L (ref 0–32)
AST: 10 [IU]/L (ref 0–40)
Albumin: 3.8 g/dL — ABNORMAL LOW (ref 3.9–4.9)
Alkaline Phosphatase: 92 [IU]/L (ref 44–121)
BUN/Creatinine Ratio: 22 (ref 12–28)
BUN: 20 mg/dL (ref 8–27)
Bilirubin Total: <0.2 mg/dL (ref 0.0–1.2)
CO2: 25 mmol/L (ref 20–29)
Calcium: 9.4 mg/dL (ref 8.7–10.3)
Chloride: 102 mmol/L (ref 96–106)
Creatinine, Ser: 0.91 mg/dL (ref 0.57–1.00)
Globulin, Total: 3.3 g/dL (ref 1.5–4.5)
Glucose: 88 mg/dL (ref 70–99)
Potassium: 4.6 mmol/L (ref 3.5–5.2)
Sodium: 141 mmol/L (ref 134–144)
Total Protein: 7.1 g/dL (ref 6.0–8.5)
eGFR: 70 mL/min/{1.73_m2} (ref >59)

## 2024-02-05 LAB — SPECIMEN STATUS REPORT

## 2024-02-05 NOTE — Progress Notes (Signed)
Felicity, your metabolic panel overall looks good.  Triglycerides are up a little bit but total cholesterol and LDL look good so just continue to work on eating healthy.  Blood count is normal no sign of anemia.

## 2024-02-09 ENCOUNTER — Encounter: Payer: Self-pay | Admitting: Family Medicine

## 2024-02-11 NOTE — Telephone Encounter (Signed)
 Spoke with radiology reading room - x-rays will be changed to stat read.

## 2024-02-13 ENCOUNTER — Encounter: Payer: Self-pay | Admitting: Family Medicine

## 2024-02-13 NOTE — Progress Notes (Signed)
 Hi Glenda Fernandez, x-ray of your low back and sacrum show no acute findings.  There is a little bit of arthritis and degeneration at the SI joints but no sign of mass or bony lesion.  If you notice that that area is getting larger or more painful or tender then please let me know we could always get a CT if needed.

## 2024-04-14 ENCOUNTER — Other Ambulatory Visit: Payer: Self-pay | Admitting: Family Medicine

## 2024-04-14 DIAGNOSIS — F902 Attention-deficit hyperactivity disorder, combined type: Secondary | ICD-10-CM

## 2024-04-14 MED ORDER — VYVANSE 70 MG PO CAPS: 70.0000 mg | ORAL_CAPSULE | Freq: Every day | ORAL | 0 refills | Status: DC

## 2024-04-14 NOTE — Telephone Encounter (Signed)
 Checked PDMP  Last filled 03/20/2024 #30 Next appt 05/04/2024

## 2024-04-28 ENCOUNTER — Other Ambulatory Visit: Payer: Self-pay | Admitting: Family Medicine

## 2024-04-28 DIAGNOSIS — R059 Cough, unspecified: Secondary | ICD-10-CM

## 2024-05-04 ENCOUNTER — Encounter: Payer: Self-pay | Admitting: Family Medicine

## 2024-05-04 ENCOUNTER — Telehealth (INDEPENDENT_AMBULATORY_CARE_PROVIDER_SITE_OTHER): Admitting: Family Medicine

## 2024-05-04 VITALS — BP 113/59 | HR 74 | Ht 64.5 in | Wt 135.0 lb

## 2024-05-04 DIAGNOSIS — I10 Essential (primary) hypertension: Secondary | ICD-10-CM

## 2024-05-04 DIAGNOSIS — F902 Attention-deficit hyperactivity disorder, combined type: Secondary | ICD-10-CM

## 2024-05-04 MED ORDER — VYVANSE 70 MG PO CAPS: 70.0000 mg | ORAL_CAPSULE | Freq: Every day | ORAL | 0 refills | Status: DC

## 2024-05-04 NOTE — Assessment & Plan Note (Signed)
 Very well with current regimen happy with does she has not noticed any side effect such as chest pain shortness of breath or palpitations.

## 2024-05-04 NOTE — Progress Notes (Signed)
   Virtual Visit via Video Note  I connected with Glenda Fernandez on 05/12/24 at  2:20 PM EDT by a video enabled telemedicine application and verified that I am speaking with the correct person using two identifiers.   I discussed the limitations of evaluation and management by telemedicine and the availability of in person appointments. The patient expressed understanding and agreed to proceed.  Patient location: at home Provider location: in office   Established Patient Office Visit  Subjective  Patient ID: Glenda Fernandez, female    DOB: [birth year]  Age: 63 y.o. MRN: [id number]  Chief Complaint  Patient presents with   ADHD    HPI  Hypertension follow-up-she is doing well with her current regimen she checks her blood pressure about once a month just to keep an eye on it it usually is between 115-120 she takes her medication regularly.  Labs are up-to-date she had those done in February.  Her husband is on the kidney transplant list. They have been going to education classes at [facility].       ROS    Objective:     BP (!) 113/59   Pulse 74   Ht 5' 4.5" (1.638 m)   Wt 135 lb (61.2 kg)   BMI 22.81 kg/m    Physical Exam Vitals and nursing note reviewed.  Constitutional:      Appearance: Normal appearance.  HENT:     Head: Normocephalic and atraumatic.  Eyes:     Conjunctiva/sclera: Conjunctivae normal.  Cardiovascular:     Rate and Rhythm: Normal rate and regular rhythm.  Pulmonary:     Effort: Pulmonary effort is normal.     Breath sounds: Normal breath sounds.  Skin:    General: Skin is warm and dry.  Neurological:     Mental Status: She is alert.  Psychiatric:        Mood and Affect: Mood normal.      No results found for any visits on 05/04/24.    The 10-year ASCVD risk score (Arnett DK, et al., 2019) is: 10%    Assessment & Plan:   Problem List Items Addressed This Visit       Cardiovascular and Mediastinum   Essential hypertension - Primary    Well controlled. Continue current regimen. Follow up in  3-4 mo         Other   ADHD (attention deficit hyperactivity disorder)   Very well with current regimen happy with does she has not noticed any side effect such as chest pain shortness of breath or palpitations.      Relevant Medications   VYVANSE  70 MG capsule (Start on 06/16/2024)   VYVANSE  70 MG capsule (Start on 07/16/2024)   VYVANSE  70 MG capsule (Start on 05/18/2024)    Return in about 13 weeks (around 08/03/2024) for ADD .    I discussed the assessment and treatment plan with the patient. The patient was provided an opportunity to ask questions and all were answered. The patient agreed with the plan and demonstrated an understanding of the instructions.   The patient was advised to call back or seek an in-person evaluation if the symptoms worsen or if the condition fails to improve as anticipated.     Glenda German, MD

## 2024-05-04 NOTE — Assessment & Plan Note (Signed)
Well controlled. Continue current regimen. Follow up in  3-4 mo  

## 2024-06-11 ENCOUNTER — Other Ambulatory Visit: Payer: Self-pay | Admitting: Family Medicine

## 2024-06-11 DIAGNOSIS — F330 Major depressive disorder, recurrent, mild: Secondary | ICD-10-CM

## 2024-06-18 ENCOUNTER — Other Ambulatory Visit: Payer: Self-pay | Admitting: Family Medicine

## 2024-06-18 DIAGNOSIS — G2581 Restless legs syndrome: Secondary | ICD-10-CM

## 2024-07-08 ENCOUNTER — Other Ambulatory Visit: Payer: Self-pay | Admitting: Family Medicine

## 2024-07-08 DIAGNOSIS — K2100 Gastro-esophageal reflux disease with esophagitis, without bleeding: Secondary | ICD-10-CM

## 2024-07-14 ENCOUNTER — Encounter: Payer: Self-pay | Admitting: Family Medicine

## 2024-07-14 DIAGNOSIS — F902 Attention-deficit hyperactivity disorder, combined type: Secondary | ICD-10-CM

## 2024-07-15 MED ORDER — LISDEXAMFETAMINE DIMESYLATE 70 MG PO CAPS
70.0000 mg | ORAL_CAPSULE | Freq: Every day | ORAL | 0 refills | Status: DC
Start: 2024-07-16 — End: 2024-09-06

## 2024-08-10 ENCOUNTER — Encounter: Payer: Self-pay | Admitting: Family Medicine

## 2024-08-10 DIAGNOSIS — F902 Attention-deficit hyperactivity disorder, combined type: Secondary | ICD-10-CM

## 2024-08-10 MED ORDER — LISDEXAMFETAMINE DIMESYLATE 70 MG PO CAPS: 70.0000 mg | ORAL_CAPSULE | Freq: Every day | ORAL | 0 refills | Status: DC

## 2024-08-17 ENCOUNTER — Encounter: Payer: Self-pay | Admitting: Sports Medicine

## 2024-08-18 ENCOUNTER — Ambulatory Visit: Payer: Self-pay

## 2024-08-18 ENCOUNTER — Ambulatory Visit: Admitting: Family Medicine

## 2024-08-18 ENCOUNTER — Encounter: Payer: Self-pay | Admitting: Family Medicine

## 2024-08-18 VITALS — BP 126/74 | HR 88 | Temp 98.3°F | Ht 64.5 in | Wt 132.6 lb

## 2024-08-18 DIAGNOSIS — J4521 Mild intermittent asthma with (acute) exacerbation: Secondary | ICD-10-CM

## 2024-08-18 DIAGNOSIS — J22 Unspecified acute lower respiratory infection: Secondary | ICD-10-CM | POA: Diagnosis not present

## 2024-08-18 DIAGNOSIS — R051 Acute cough: Secondary | ICD-10-CM | POA: Diagnosis not present

## 2024-08-18 LAB — POCT INFLUENZA A/B
Influenza A, POC: NEGATIVE
Influenza B, POC: NEGATIVE

## 2024-08-18 LAB — POC COVID19 BINAXNOW: SARS Coronavirus 2 Ag: NEGATIVE

## 2024-08-18 MED ORDER — AMOXICILLIN-POT CLAVULANATE 875-125 MG PO TABS: 1.0000 | ORAL_TABLET | Freq: Two times a day (BID) | ORAL | 0 refills | Status: DC

## 2024-08-18 MED ORDER — PREDNISONE 20 MG PO TABS: 40.0000 mg | ORAL_TABLET | Freq: Every day | ORAL | 0 refills | Status: DC

## 2024-08-18 NOTE — Patient Instructions (Signed)
 I do think you have a flare of your asthma and possible early lower respiratory infection.  I do hear some congestion on the right side.  Since you are feeling a little bit better today I will hold off on x-ray or blood work at this time, but have started you on a antibiotic for possible infection as well as prednisone  for the asthma flare.  That should lessen the need for albuterol  but you can use that up to every 4-6 hours for now if still needed.  If you require albuterol  sooner than 4 hours, please be seen through the ER.  If you are not continuing to improve in the next few days or any worsening, please be seen.  I hope you feel better soon and take care

## 2024-08-18 NOTE — Progress Notes (Signed)
 Subjective:  Patient ID: Glenda Fernandez, female    DOB: 07-Oct-1961  Age: 63 y.o. MRN: [id number]  CC:  Chief Complaint  Patient presents with   Cough    Pt notes cough starting Friday with congestion, drainage, prone to bronchitis, SOB, no fevers     HPI Glenda Fernandez presents for  Acute visit for above, PCP is Alvan Dorothyann BIRCH, MD  Cough As above, started 5 days ago.  Initially with nasal congestion, drainage, feels like in chest past 3 days. some dyspnea - with activity noted past few days, increased need for albuterol  3 to 4 times per day or more with this illness.  No nebulizer. Slightly better today than yesterday. Yesterday was worse. No fever, drinking fluids. Sick contact - nephew with similar illness last week.  Smoker. 1ppd.   Past medical history noted with history of mild intermittent asthma.  She is not on daily medication for asthma.  Typically uses once per day or less. She does have history of reflux as well, treated with omeprazole  40 mg daily - denies recent heartburn flare.   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) 09/14/2012   Iron deficiency anemia 09/11/2012   Depression 09/11/2012   Hyperlipidemia 09/10/2012   ADHD (attention deficit hyperactivity disorder) 09/10/2012   Tobacco abuse 09/10/2012   Anemia 08/11/2012   Stiffness of joint, not elsewhere classified, hand 03/19/2010    Vitamin D deficiency 03/12/2009   Neck pain 03/08/2009   INCONTINENCE, STRESS, FEMALE 09/23/2006   INSOMNIA NOS 09/23/2006   Past Medical History:  Diagnosis Date   Arthritis    osteo   Asthma    Closed fracture of base of fifth metatarsal bone of right foot 06/01/2021   Closed fracture of neck of third metacarpal bone of left hand 03/06/2022   Depression    GERD (gastroesophageal reflux disease)    Iron deficiency anemia 09/11/2012   Tibial plateau fracture, left, closed, initial encounter 11/03/2018   Past Surgical History:  Procedure Laterality Date   BACK SURGERY  2001   BLADDER SUSPENSION     BREAST ENHANCEMENT SURGERY Left 1879   CARPAL TUNNEL RELEASE Right 11/01/2016   Procedure: CARPAL TUNNEL RELEASE;  Surgeon: Norleen Gavel, MD;  Location: [facility];  Service: Orthopedics;  Laterality: Right;   HAND SURGERY Left 2013   for fracture, [place] [facility]   NECK SURGERY     ORIF WRIST FRACTURE Right 11/01/2016   Procedure: OPEN REDUCTION INTERNAL FIXATION (ORIF) WRIST FRACTURE;  Surgeon: Norleen Gavel, MD;  Location: [facility];  Service: Orthopedics;  Laterality: Right;   SHOULDER SURGERY  2005   No Known Allergies Prior to Admission medications   Medication Sig Start Date End Date Taking? Authorizing Provider  albuterol  (VENTOLIN  HFA) 108 (90 Base) MCG/ACT inhaler USE 2 PUFFS INTO LUNGS EVERY 6 HOURS AS NEEDED FOR WHEEZE OR FOR SHORTNESS OF BREATH 04/28/24  Yes Metheney,  Dorothyann BIRCH, MD  FLUoxetine  (PROZAC ) 40 MG capsule TAKE 2 CAPSULES BY MOUTH EVERY DAY 06/11/24  Yes Alvan Dorothyann BIRCH, MD  hydrOXYzine  (ATARAX ) 10 MG tablet TAKE 1 TO 2 TABLETS BY MOUTH EVERY 8 HOURS AS NEEDED FOR ANXIETY 08/01/23  Yes Alvan Dorothyann BIRCH, MD  ipratropium-albuterol  (DUONEB) 0.5-2.5 (3) MG/3ML SOLN TAKE 3 MLS BY NEBULIZATION EVERY 4 (FOUR) HOURS AS NEEDED. 04/04/23  Yes Alvan Dorothyann BIRCH, MD  lisdexamfetamine (VYVANSE ) 70 MG capsule Take 1 capsule (70 mg total)  by mouth daily. 07/16/24  Yes Alvan Dorothyann BIRCH, MD  lisdexamfetamine (VYVANSE ) 70 MG capsule Take 1 capsule (70 mg total) by mouth daily. 08/13/24  Yes Alvan Dorothyann BIRCH, MD  lisinopril -hydrochlorothiazide  (ZESTORETIC ) 20-25 MG tablet Take 1 tablet by mouth daily. 30 day supply given. Must have labs done for additional refills. 02/04/24  Yes Alvan Dorothyann BIRCH, MD  omeprazole  (PRILOSEC) 40 MG capsule TAKE 1 CAPSULE BY MOUTH EVERY DAY 07/12/24  Yes Alvan Dorothyann BIRCH, MD  rOPINIRole  (REQUIP ) 0.5 MG tablet TAKE 1 TABLET BY MOUTH EVERYDAY AT BEDTIME 06/20/24  Yes Alvan Dorothyann BIRCH, MD  rosuvastatin  (CRESTOR ) 10 MG tablet Take 1 tablet (10 mg total) by mouth at bedtime. 02/04/24  Yes Alvan Dorothyann BIRCH, MD  VYVANSE  70 MG capsule Take 1 capsule (70 mg total) by mouth daily. 05/18/24  Yes Alvan Dorothyann BIRCH, MD   Social History   Socioeconomic History   Marital status: Married    Spouse name: Lamar   Number of children: 2   Years of education: Not on file   Highest education level: Some college, no degree  Occupational History   Occupation: Evidence Tech    Comment: [employer]  Tobacco Use   Smoking status: Every Day    Current packs/day: 1.00    Average packs/day: 1 pack/day for 40.0 years (40.0 ttl pk-yrs)    Types: Cigarettes   Smokeless tobacco: Never   Tobacco comments:    1 pack a day   Substance and Sexual Activity   Alcohol use: No   Drug use: Not on file   Sexual activity: Not on file  Other Topics Concern   Not on file  Social History Narrative   Some college.  3 caffeine per day.  Walks 3 times per week for exercise.    Social Drivers of Health   Financial Resource Strain: Medium Risk (02/04/2024)   Overall Financial Resource Strain (CARDIA)    Difficulty of Paying Living Expenses: Somewhat hard  Food Insecurity: Food Insecurity Present (02/04/2024)   Hunger Vital Sign    Worried About Running Out of Food in the Last Year: Sometimes true    Ran Out  of Food in the Last Year: Never true  Transportation Needs: No Transportation Needs (02/04/2024)   PRAPARE - Administrator, Civil Service (Medical): No    Lack of Transportation (Non-Medical): No  Physical Activity: Insufficiently Active (02/04/2024)   Exercise Vital Sign    Days of Exercise per Week: 3 days    Minutes of Exercise per Session: 20 min  Stress: Stress Concern Present (02/04/2024)   [employer] of Occupational Health - Occupational Stress Questionnaire    Feeling of Stress : To some extent  Social Connections: Moderately Isolated (02/04/2024)   Social Connection and Isolation Panel    Frequency of Communication with Friends and Family: Once a week    Frequency of Social Gatherings with Friends and Family: Once a week    Attends Religious  Services: 1 to 4 times per year    Active Member of Clubs or Organizations: No    Attends Banker Meetings: Not on file    Marital Status: Married  Catering manager Violence: Not on file    Review of Systems Per HPI  Objective:   Vitals:   08/18/24 1123  BP: 126/74  Pulse: 88  Temp: 98.3 F (36.8 C)  TempSrc: Temporal  SpO2: 95%  Weight: 132 lb 9.6 oz (60.1 kg)  Height: 5' 4.5 (1.638 m)    Physical Exam Vitals reviewed.  Constitutional:      General: She is not in acute distress.    Appearance: Normal appearance. She is well-developed.  HENT:     Head: Normocephalic and atraumatic.     Right Ear: Hearing, tympanic membrane, ear canal and external ear normal.     Left Ear: Hearing, tympanic membrane, ear canal and external ear normal.     Nose: Nose normal.     Mouth/Throat:     Pharynx: No posterior oropharyngeal erythema.  Eyes:     Conjunctiva/sclera: Conjunctivae normal.     Pupils: Pupils are equal, round, and reactive to light.  Cardiovascular:     Rate and Rhythm: Normal rate and regular rhythm.     Heart sounds: Normal heart sounds. No murmur heard. Pulmonary:     Effort:  Pulmonary effort is normal. No respiratory distress.     Breath sounds: Wheezing (Some faint diffuse expiratory wheezes throughout but good air exchange, no distress, speaking in full sentences.) and rhonchi (Few coarse breath sounds right lower lobe greater than left lower lobe.  No rales.) present.  Abdominal:     General: There is no distension.     Palpations: Abdomen is soft.     Tenderness: There is no abdominal tenderness. There is no guarding or rebound.  Musculoskeletal:     Right lower leg: No edema.     Left lower leg: No edema.  Skin:    General: Skin is warm and dry.     Findings: No rash.  Neurological:     Mental Status: She is alert and oriented to person, place, and time.  Psychiatric:        Mood and Affect: Mood normal.        Behavior: Behavior normal.    Results for orders placed or performed in visit on 08/18/24  POC COVID-19 BinaxNow   Collection Time: 08/18/24 11:43 AM  Result Value Ref Range   SARS Coronavirus 2 Ag Negative Negative  POCT Influenza A/B   Collection Time: 08/18/24 11:43 AM  Result Value Ref Range   Influenza A, POC Negative Negative   Influenza B, POC Negative Negative     Assessment & Plan:  Glenda Fernandez is a 63 y.o. female . Acute cough - Plan: POC COVID-19 BinaxNow, POCT Influenza A/B  LRTI (lower respiratory tract infection) - Plan: amoxicillin -clavulanate (AUGMENTIN ) 875-125 MG tablet  Mild intermittent asthma with acute exacerbation - Plan: predniSONE  (DELTASONE ) 20 MG tablet Suspected acute exacerbation of asthma with possible early lower respiratory tract infection.  Given current vital signs and subjective improvement will treat outpatient for now, hold on imaging, blood work for now with RTC/ER precautions given.  Albuterol  if needed with ER precautions if increased need.  Start prednisone  for asthma exacerbation, Augmentin  for possible early lower respiratory tract infection with potential side effects and risks of meds  discussed.  Understanding of plan expressed with all questions answered  Meds ordered this encounter  Medications   predniSONE  (DELTASONE ) 20 MG tablet    Sig: Take 2 tablets (40 mg total) by mouth daily with breakfast.    Dispense:  10 tablet    Refill:  0   amoxicillin -clavulanate (AUGMENTIN ) 875-125 MG tablet    Sig: Take 1 tablet by mouth 2 (two) times daily.    Dispense:  20 tablet    Refill:  0   Patient Instructions  I do think you have a flare of your asthma and possible early lower respiratory infection.  I do hear some congestion on the right side.  Since you are feeling a little bit better today I will hold off on x-ray or blood work at this time, but have started you on a antibiotic for possible infection as well as prednisone  for the asthma flare.  That should lessen the need for albuterol  but you can use that up to every 4-6 hours for now if still needed.  If you require albuterol  sooner than 4 hours, please be seen through the ER.  If you are not continuing to improve in the next few days or any worsening, please be seen.  I hope you feel better soon and take care    Signed,   Reyes Pines, MD [facility], [facility] 08/18/24 12:09 PM

## 2024-08-18 NOTE — Telephone Encounter (Signed)
 FYI Only or Action Required?: FYI only for provider.  Patient was last seen in primary care on 05/04/2024 by Glenda Dorothyann BIRCH, MD.  Called Nurse Triage reporting Cough.  Symptoms began several days ago.  Interventions attempted: Prescription medications: albuterol  inhaler.  Symptoms are: dry cough (hacking with coughing spells that last 30 minutes), headache, runny nose, intermittent wheezing, (no SOB at rest) SOB when exerting and talkingimproved today compared to yesterday.  Triage Disposition: See HCP Within 4 Hours (Or PCP Triage)  Patient/caregiver understands and will follow disposition?: Yes              Copied from CRM #[id number]. Topic: Clinical - Red Word Triage >> Aug 18, 2024  8:39 AM Susanna ORN wrote: Red Word that prompted transfer to Nurse Triage: Patient states she's had a bad cold that has a lot of coughing with it. States she thinks it's bronchitis. Patient states she has no fever but just a really bad headache. Wants to be seen today virtually or in person. Reason for Disposition  [1] MILD difficulty breathing (e.g., minimal/no SOB at rest, SOB with walking, pulse < 100) AND [2] still present when not coughing  Answer Assessment - Initial Assessment Questions She states she has been using her Ventolin  inhaler but feels it is not helping break up the cough enough. She states today she feels better than yesterday.  1. ONSET: When did the cough begin?      Thursday or Friday.  2. SEVERITY: How bad is the cough today?      Not constant but she states once it starts it can not stop. She states around 4-5 o clock this morning she had a coughing spell that lasted about 30 minutes. She states it is hacking.  3. SPUTUM: Describe the color of your sputum (e.g., none, dry cough; clear, white, yellow, green)     Dry , no sputum.  4. HEMOPTYSIS: Are you coughing up any blood? If Yes, ask: How much? (e.g., flecks, streaks, tablespoons, etc.)      No.  5. DIFFICULTY BREATHING: Are you having difficulty breathing? If Yes, ask: How bad is it? (e.g., mild, moderate, severe)      She states at rest no difficulty breathing. She states SOB if talking a lot or if exerting herself. She states when it happens it feels severe, like single words. At this time she is speaking in full sentences and no labored breathing noted. She states she has been having wheezing real bad as well. She states she has been using 4 puffs of albuterol  every 4 hours when having coughing spells.  6. FEVER: Do you have a fever? If Yes, ask: What is your temperature, how was it measured, and when did it start?     No.  7. CARDIAC HISTORY: Do you have any history of heart disease? (e.g., heart attack, congestive heart failure)      High blood pressure.  8. LUNG HISTORY: Do you have any history of lung disease?  (e.g., pulmonary embolus, asthma, emphysema)     Asthma.  9. PE RISK FACTORS: Do you have a history of blood clots? (or: recent major surgery, recent prolonged travel, bedridden)     No.  10. OTHER SYMPTOMS: Do you have any other symptoms? (e.g., runny nose, wheezing, chest pain)       2-3/10 headache (worsens with coughing), runny nose, wheezing, hoarse voice. Denies chest pain.   11. PREGNANCY: Is there any chance you are pregnant? When was your  last menstrual period?       N/A.  12. TRAVEL: Have you traveled out of the country in the last month? (e.g., travel history, exposures)       No travel; she states her grandson had a cold last week and has gotten the whole family sick.  Protocols used: Cough - Acute Non-Productive-A-AH

## 2024-08-25 ENCOUNTER — Other Ambulatory Visit: Payer: Self-pay | Admitting: Family Medicine

## 2024-08-25 DIAGNOSIS — R059 Cough, unspecified: Secondary | ICD-10-CM

## 2024-09-06 ENCOUNTER — Telehealth (INDEPENDENT_AMBULATORY_CARE_PROVIDER_SITE_OTHER): Admitting: Family Medicine

## 2024-09-06 ENCOUNTER — Encounter: Payer: Self-pay | Admitting: Family Medicine

## 2024-09-06 VITALS — BP 116/67 | Temp 98.3°F | Ht 64.5 in | Wt 134.0 lb

## 2024-09-06 DIAGNOSIS — G4719 Other hypersomnia: Secondary | ICD-10-CM

## 2024-09-06 DIAGNOSIS — F902 Attention-deficit hyperactivity disorder, combined type: Secondary | ICD-10-CM

## 2024-09-06 DIAGNOSIS — E559 Vitamin D deficiency, unspecified: Secondary | ICD-10-CM

## 2024-09-06 DIAGNOSIS — R5383 Other fatigue: Secondary | ICD-10-CM | POA: Diagnosis not present

## 2024-09-06 MED ORDER — LISDEXAMFETAMINE DIMESYLATE 70 MG PO CAPS: 70.0000 mg | ORAL_CAPSULE | Freq: Every day | ORAL | 0 refills | Status: DC

## 2024-09-06 NOTE — Progress Notes (Signed)
Pt requesting refill on Vyvanse. 

## 2024-09-06 NOTE — Progress Notes (Signed)
 Virtual Visit via Video Note  I connected with Glenda Fernandez on 09/06/24 at  3:20 PM EDT by a video enabled telemedicine application and verified that I am speaking with the correct person using two identifiers.   I discussed the limitations of evaluation and management by telemedicine and the availability of in person appointments. The patient expressed understanding and agreed to proceed.  Patient location: at home Provider location: in office  Subjective:    CC:  No chief complaint on file.   HPI:  She is here today for virtual follow-up for her ADHD-currently on Vyvanse  70 mg tolerating well she will need new prescription sent to the local pharmacy.  She was sick recently with an upper respiratory virus and ended up seeing a provider in our [place] office.  She says she got sick very quickly.  She is feeling much better now.  She also wanted to let me know that she is just felt excessively tired she sometimes will sleep 15 to 17 hours a day.  It is been going on for a while.  But she also has felt a little bit more stressed.  She has 7 people living in her 1300 square foot home.  And so tends to stay in her bedroom.    Past medical history, Surgical history, Family history not pertinant except as noted below, Social history, Allergies, and medications have been entered into the medical record, reviewed, and corrections made.    Objective:    General: Speaking clearly in complete sentences without any shortness of breath.  Alert and oriented x3.  Normal judgment. No apparent acute distress.    Impression and Recommendations:    Problem List Items Addressed This Visit       Other   Vitamin D deficiency   Relevant Orders   VITAMIN D 25 Hydroxy (Vit-D Deficiency, Fractures)   B12 and Folate Panel   Vitamin B1   Vitamin B6   ADHD (attention deficit hyperactivity disorder) - Primary   Well controlled. Continue current regimen. Follow up in  3-4 months.        Relevant Medications   lisdexamfetamine (VYVANSE ) 70 MG capsule (Start on 09/07/2024)   lisdexamfetamine (VYVANSE ) 70 MG capsule (Start on 10/05/2024)   lisdexamfetamine (VYVANSE ) 70 MG capsule (Start on 10/31/2024)   Other Visit Diagnoses       Excessive daytime sleepiness       Relevant Orders   CMP14+EGFR   TSH   CBC with Differential/Platelet   VITAMIN D 25 Hydroxy (Vit-D Deficiency, Fractures)   B12 and Folate Panel   Vitamin B1   Vitamin B6     Other fatigue       Relevant Orders   CMP14+EGFR   TSH   CBC with Differential/Platelet   VITAMIN D 25 Hydroxy (Vit-D Deficiency, Fractures)   B12 and Folate Panel   Vitamin B1   Vitamin B6      Excess sleeping - Will get some labs just to rule out deficiencies, anemia, thyroid  disorder etc.  Prior history of vitamin D deficiency so we will recheck that as well and prior history of anemia.  If labs are all normal also consider could be related to mood and maybe even some mild depression symptoms which can also cause excess sleepiness.  Could also have her come in in person to check vitals make sure blood pressure looks good etc.  Orders Placed This Encounter  Procedures   CMP14+EGFR   TSH  CBC with Differential/Platelet   VITAMIN D 25 Hydroxy (Vit-D Deficiency, Fractures)   B12 and Folate Panel   Vitamin B1   Vitamin B6    Meds ordered this encounter  Medications   lisdexamfetamine (VYVANSE ) 70 MG capsule    Sig: Take 1 capsule (70 mg total) by mouth daily.    Dispense:  30 capsule    Refill:  0   lisdexamfetamine (VYVANSE ) 70 MG capsule    Sig: Take 1 capsule (70 mg total) by mouth daily.    Dispense:  30 capsule    Refill:  0   lisdexamfetamine (VYVANSE ) 70 MG capsule    Sig: Take 1 capsule (70 mg total) by mouth daily.    Dispense:  30 capsule    Refill:  0     I discussed the assessment and treatment plan with the patient. The patient was provided an opportunity to ask questions and all were answered. The  patient agreed with the plan and demonstrated an understanding of the instructions.   The patient was advised to call back or seek an in-person evaluation if the symptoms worsen or if the condition fails to improve as anticipated.  I spent 20 minutes on the day of the encounter to include pre-visit record review, face-to-face time with the patient and post visit ordering of test.  Dorothyann Byars, MD

## 2024-09-06 NOTE — Assessment & Plan Note (Signed)
Well controlled. Continue current regimen. Follow up in  3-4 months.  

## 2024-09-06 NOTE — Telephone Encounter (Signed)
 D1 to see if she could do a 320 virtually today?

## 2024-10-12 ENCOUNTER — Ambulatory Visit: Admitting: Family Medicine

## 2024-11-07 ENCOUNTER — Other Ambulatory Visit: Payer: Self-pay | Admitting: Family Medicine

## 2024-11-07 DIAGNOSIS — I10 Essential (primary) hypertension: Secondary | ICD-10-CM

## 2024-11-15 ENCOUNTER — Ambulatory Visit

## 2024-11-23 ENCOUNTER — Ambulatory Visit

## 2024-11-23 ENCOUNTER — Ambulatory Visit: Payer: Self-pay

## 2024-11-23 ENCOUNTER — Ambulatory Visit: Admitting: Family Medicine

## 2024-11-23 ENCOUNTER — Encounter: Payer: Self-pay | Admitting: Family Medicine

## 2024-11-23 VITALS — BP 121/78 | HR 80

## 2024-11-23 DIAGNOSIS — W010XXA Fall on same level from slipping, tripping and stumbling without subsequent striking against object, initial encounter: Secondary | ICD-10-CM

## 2024-11-23 DIAGNOSIS — M25562 Pain in left knee: Secondary | ICD-10-CM

## 2024-11-23 MED ORDER — TRAMADOL HCL 50 MG PO TABS: 50.0000 mg | ORAL_TABLET | Freq: Three times a day (TID) | ORAL | 0 refills | Status: AC | PRN

## 2024-11-23 NOTE — Progress Notes (Signed)
 Acute Office Visit  Patient ID: Glenda Fernandez, female    DOB: [birth year], 63 y.o.   MRN: [id number]  PCP: Alvan Dorothyann BIRCH, MD  Chief Complaint  Patient presents with   Knee Pain    Subjective:     HPI  Discussed the use of AI scribe software for clinical note transcription with the patient, who gave verbal consent to proceed.  History of Present Illness Glenda Fernandez is a 63 year old female who presents with knee pain and swelling following a twisting injury.  Right knee pain and swelling - Onset 12 days ago after a twisting injury when her right foot got caught on a rug while wearing flip flops - Immediate pain and swelling in the posterior aspect of the right knee - Initial severity prevented ambulation and required assistance to get to the bathroom and out of bed - Swelling has decreased significantly since the initial injury - Pain and swelling plateaued over the past 3-4 days with no further improvement - Currently able to get up by herself and gently move the knee - Pain managed with ice and ibuprofen , with concern for possible excessive ibuprofen  use - using a brace for support   Functional impairment - Initially unable to walk or bear weight on the right knee - Movement was difficult and required assistance for basic activities - Currently able to ambulate independently at home  History of right knee injuries - Prior injuries to the same knee, including a fall on steps and a previous twisting injury during an [employer] and gait disturbance - History of balance issues related to her neck, affecting her walking - Describes gait as 'my brain wasn't sending signals to my legs fast enough,' resulting in a fast-paced walk - Mostly stays at home and does not go out much   ROS     Objective:    BP 121/78   Pulse 80   SpO2 100%    Physical Exam Vitals reviewed.  Constitutional:      Appearance: Normal appearance.  HENT:     Head:  Normocephalic.  Pulmonary:     Effort: Pulmonary effort is normal.  Musculoskeletal:     Comments: Left knee with some swelling superiorly. No tenderness over the kneecap.  Pain medially with vaglus stress, slight pain laterally with varus stress.   Neurological:     Mental Status: She is alert and oriented to person, place, and time.  Psychiatric:        Mood and Affect: Mood normal.        Behavior: Behavior normal.       No results found for any visits on 11/23/24.     Assessment & Plan:   Problem List Items Addressed This Visit       Other   Acute pain of left knee - Primary   Relevant Medications   traMADol  (ULTRAM ) 50 MG tablet   Other Relevant Orders   DG Knee Complete 4 Views Left   Other Visit Diagnoses       Fall on same level from slipping, tripping or stumbling, initial encounter       Relevant Orders   DG Knee Complete 4 Views Left       Assessment and Plan Assessment & Plan Left knee injury with acute pain Acute left knee injury with persistent pain and reduced swelling. Differential includes cartilage or soft tissue injury. Medial pain with valgus stress suggests possible ligament involvement. MRI may  be needed if x-ray is reassuring. - Ordered x-ray of left knee to assess for fracture and arthritis. - Plan MRI if x-ray is reassuring to evaluate for cartilage or ligament injury. - Advised slow motion exercises for knee flexion and extension. - Will coordinate with insurance for MRI authorization if needed. - continue to wear brace.     Meds ordered this encounter  Medications   traMADol  (ULTRAM ) 50 MG tablet    Sig: Take 1 tablet (50 mg total) by mouth every 8 (eight) hours as needed for up to 5 days.    Dispense:  15 tablet    Refill:  0    No follow-ups on file.  Dorothyann Byars, MD [facility] at [facility]

## 2024-11-23 NOTE — Telephone Encounter (Signed)
 FYI Only or Action Required?: FYI only for provider: appointment scheduled on 11/23/24.  Patient was last seen in primary care on 09/06/2024 by Alvan Dorothyann BIRCH, MD.  Called Nurse Triage reporting Knee Pain.  Symptoms began a week ago.  Interventions attempted: OTC medications: Ibuprofen , Rest, hydration, or home remedies, and Ice/heat application.  Symptoms are: unchanged.  Triage Disposition: See PCP When Office is Open (Within 3 Days)  Patient/caregiver understands and will follow disposition?: Yes Reason for Disposition  [1] MODERATE pain (e.g., interferes with normal activities, limping) AND [2] present > 3 days  Answer Assessment - Initial Assessment Questions Twisted knee weird. Taken ibuprofen , using ice, wearing a brace. Seemed like it was getting better but now its not.  1. LOCATION and RADIATION: Where is the pain located?      Left knee and goes down leg  2. QUALITY: What does the pain feel like?  (e.g., sharp, dull, aching, burning)     Sharp pains at times  3. SEVERITY: How bad is the pain? What does it keep you from doing?   (Scale 1-10; or mild, moderate, severe)     6/10  4. ONSET: When did the pain start? Does it come and go, or is it there all the time?     About a week ago  5. RECURRENT: Have you had this pain before? If Yes, ask: When, and what happened then?     No  6. SETTING: Has there been any recent work, exercise or other activity that involved that part of the body?      Yes, twisted knee   7. ASSOCIATED SYMPTOMS: Is there any swelling or redness of the knee?     Swelling and redness   8. OTHER SYMPTOMS: Do you have any other symptoms? (e.g., calf pain, chest pain, difficulty breathing, fever)     Denies  Protocols used: Knee Pain-A-AH  Copied from CRM #[id number]. Topic: Clinical - Red Word Triage >> Nov 23, 2024  8:57 AM Ivette P wrote: Red Word that prompted transfer to Nurse Triage: sunday twisted knee after  thanksgiving. immediate paina nd swelling. put off going to the proivder becasue was getting better. its not getting better and hurting. left knee. didnt completely fall. when turn right foot caught under the rug and jerked her.

## 2024-11-24 ENCOUNTER — Ambulatory Visit: Payer: Self-pay | Admitting: Family Medicine

## 2024-11-24 DIAGNOSIS — E559 Vitamin D deficiency, unspecified: Secondary | ICD-10-CM

## 2024-11-24 DIAGNOSIS — E538 Deficiency of other specified B group vitamins: Secondary | ICD-10-CM

## 2024-11-24 NOTE — Progress Notes (Signed)
 Hi Glenda Fernandez, you look a little dry on your blood work please make sure that you are drinking plenty of fluids.  No sign of infection which is reassuring.  No anemia.  Your vitamin D is extremely low please make sure you are taking 50 mcg daily.  Your B12 is also low so I would like you to start an over-the-counter B12 supplement.  Lets plan to recheck your vitamin D and B12 in about 12 weeks.  Thyroid  level is normal.  B1 and B6 are still pending.  I would really like to get your mammogram and bone density scheduled so please let me know when you feel you could do that.

## 2024-11-25 NOTE — Progress Notes (Signed)
 B6 is also on the low end of normal.  Vitamin B1 is still pending.  Instead of just a plain B12 you can always pick up a beet complex which has multiple B vitamins in it.

## 2024-11-26 ENCOUNTER — Encounter: Payer: Self-pay | Admitting: Family Medicine

## 2024-11-26 LAB — CBC WITH DIFFERENTIAL/PLATELET
Basophils Absolute: 0.1 x10E3/uL (ref 0.0–0.2)
Basos: 1 %
EOS (ABSOLUTE): 0.5 x10E3/uL — ABNORMAL HIGH (ref 0.0–0.4)
Eos: 6 %
Hematocrit: 36 % (ref 34.0–46.6)
Hemoglobin: 11.8 g/dL (ref 11.1–15.9)
Immature Grans (Abs): 0 x10E3/uL (ref 0.0–0.1)
Immature Granulocytes: 0 %
Lymphocytes Absolute: 2.5 x10E3/uL (ref 0.7–3.1)
Lymphs: 30 %
MCH: 30.2 pg (ref 26.6–33.0)
MCHC: 32.8 g/dL (ref 31.5–35.7)
MCV: 92 fL (ref 79–97)
Monocytes Absolute: 0.6 x10E3/uL (ref 0.1–0.9)
Monocytes: 7 %
Neutrophils Absolute: 4.6 x10E3/uL (ref 1.4–7.0)
Neutrophils: 56 %
Platelets: 308 x10E3/uL (ref 150–450)
RBC: 3.91 x10E6/uL (ref 3.77–5.28)
RDW: 13.1 % (ref 11.7–15.4)
WBC: 8.4 x10E3/uL (ref 3.4–10.8)

## 2024-11-26 LAB — CMP14+EGFR
ALT: 8 IU/L (ref 0–32)
AST: 13 IU/L (ref 0–40)
Albumin: 4.3 g/dL (ref 3.9–4.9)
Alkaline Phosphatase: 79 IU/L (ref 49–135)
BUN/Creatinine Ratio: 28 (ref 12–28)
BUN: 34 mg/dL — ABNORMAL HIGH (ref 8–27)
Bilirubin Total: <0.2 mg/dL (ref 0.0–1.2)
CO2: 23 mmol/L (ref 20–29)
Calcium: 9.4 mg/dL (ref 8.7–10.3)
Chloride: 101 mmol/L (ref 96–106)
Creatinine, Ser: 1.22 mg/dL — ABNORMAL HIGH (ref 0.57–1.00)
Globulin, Total: 2.5 g/dL (ref 1.5–4.5)
Glucose: 92 mg/dL (ref 70–99)
Potassium: 4.3 mmol/L (ref 3.5–5.2)
Sodium: 141 mmol/L (ref 134–144)
Total Protein: 6.8 g/dL (ref 6.0–8.5)
eGFR: 49 mL/min/1.73 — ABNORMAL LOW (ref >59)

## 2024-11-26 LAB — VITAMIN B6: Vitamin B6: 3.5 ug/L (ref 3.4–65.2)

## 2024-11-26 LAB — VITAMIN D 25 HYDROXY (VIT D DEFICIENCY, FRACTURES): Vit D, 25-Hydroxy: 9.1 ng/mL — ABNORMAL LOW (ref 30.0–100.0)

## 2024-11-26 LAB — B12 AND FOLATE PANEL
Folate: 7.4 ng/mL (ref >3.0)
Vitamin B-12: 195 pg/mL — ABNORMAL LOW (ref 232–1245)

## 2024-11-26 LAB — TSH: TSH: 0.582 u[IU]/mL (ref 0.450–4.500)

## 2024-11-26 LAB — VITAMIN B1: Thiamine: 122.5 nmol/L (ref 66.5–200.0)

## 2024-11-26 NOTE — Progress Notes (Signed)
 Hi Glenda Fernandez, B1 looks good.

## 2024-11-29 ENCOUNTER — Ambulatory Visit: Payer: Self-pay | Admitting: Family Medicine

## 2024-11-29 ENCOUNTER — Encounter: Payer: Self-pay | Admitting: Family Medicine

## 2024-11-29 ENCOUNTER — Telehealth: Payer: Self-pay

## 2024-11-29 DIAGNOSIS — F902 Attention-deficit hyperactivity disorder, combined type: Secondary | ICD-10-CM

## 2024-11-29 MED ORDER — LISDEXAMFETAMINE DIMESYLATE 70 MG PO CAPS: 70.0000 mg | ORAL_CAPSULE | Freq: Every day | ORAL | 0 refills | Status: DC

## 2024-11-29 MED ORDER — LISDEXAMFETAMINE DIMESYLATE 70 MG PO CAPS: 70.0000 mg | ORAL_CAPSULE | Freq: Every day | ORAL | 0 refills | Status: AC

## 2024-11-29 NOTE — Telephone Encounter (Signed)
 Left message for a return call

## 2024-11-29 NOTE — Telephone Encounter (Signed)
 Copied from CRM #[id number]. Topic: Clinical - Lab/Test Results >> Nov 29, 2024  3:06 PM Berwyn MATSU wrote: Reason for CRM:  Radiology department called in to have MD and or care team look at results for STAT knee x-ray for an order that was placed on 12/09/2. Per Radiology confirmed we can see results and if we can forward message to care team.   I called CAL to advise. I also advised that I would send CRM.   May you please assist.

## 2024-11-29 NOTE — Progress Notes (Signed)
 Hi Glenda Fernandez, x-ray shows a minimally displaced fracture along the posterior medial margin.  I would like to get you in with an orthopedist or sports med.  If you prefer [name] I can get you in with Ortho.  They also have a sports med doc at our [facility] location but I am not sure if that is close to you

## 2024-11-30 ENCOUNTER — Encounter: Payer: Self-pay | Admitting: Family Medicine

## 2024-11-30 NOTE — Telephone Encounter (Signed)
 Patient is scheduled today 11/30/2024 with [facility] -[address]-[place] office - Dr. Ethel Fenton at 9:20 am - Last OV notes, x-ray results , demographics have been faxed to this location today for appt.

## 2024-11-30 NOTE — Telephone Encounter (Signed)
 Lets get her referred to Ortho in [place] ASAP let see if they can work her in today.  Let me know appointment time I will definitely send over some pain medication today but lets see if we can get her worked in today.

## 2024-12-03 NOTE — Telephone Encounter (Signed)
 See result note.

## 2024-12-13 ENCOUNTER — Encounter: Payer: Self-pay | Admitting: Family Medicine

## 2024-12-15 ENCOUNTER — Ambulatory Visit: Admitting: Family Medicine

## 2024-12-23 ENCOUNTER — Encounter: Payer: Self-pay | Admitting: Family Medicine

## 2024-12-23 NOTE — Telephone Encounter (Signed)
 Okay to use acute slot next week it sounds like she is cut multiple things going on.

## 2024-12-24 NOTE — Telephone Encounter (Signed)
 Patient scheduled to see Dr. Alvan on [facility] 12/27/24

## 2024-12-27 ENCOUNTER — Encounter: Payer: Self-pay | Admitting: Family Medicine

## 2024-12-27 ENCOUNTER — Ambulatory Visit: Admitting: Family Medicine

## 2024-12-27 ENCOUNTER — Ambulatory Visit

## 2024-12-27 ENCOUNTER — Ambulatory Visit: Payer: Self-pay | Admitting: Family Medicine

## 2024-12-27 VITALS — BP 119/54 | HR 85

## 2024-12-27 DIAGNOSIS — W010XXA Fall on same level from slipping, tripping and stumbling without subsequent striking against object, initial encounter: Secondary | ICD-10-CM

## 2024-12-27 DIAGNOSIS — M25532 Pain in left wrist: Secondary | ICD-10-CM

## 2024-12-27 DIAGNOSIS — S21002A Unspecified open wound of left breast, initial encounter: Secondary | ICD-10-CM

## 2024-12-27 DIAGNOSIS — S52502B Unspecified fracture of the lower end of left radius, initial encounter for open fracture type I or II: Secondary | ICD-10-CM

## 2024-12-27 DIAGNOSIS — A498 Other bacterial infections of unspecified site: Secondary | ICD-10-CM

## 2024-12-27 MED ORDER — HYDROCODONE-ACETAMINOPHEN 5-325 MG PO TABS: 1.0000 | ORAL_TABLET | Freq: Four times a day (QID) | ORAL | 0 refills | Status: DC | PRN

## 2024-12-27 MED ORDER — DOXYCYCLINE HYCLATE 100 MG PO TABS: 100.0000 mg | ORAL_TABLET | Freq: Two times a day (BID) | ORAL | 0 refills | Status: DC

## 2024-12-27 NOTE — Addendum Note (Signed)
 Addended by: FREYA BASCOM CROME on: 12/27/2024 03:36 PM   Modules accepted: Orders

## 2024-12-27 NOTE — Progress Notes (Signed)
 Hi Glenda Fernandez, sorry for the delay. You have multiple fractures in your wrist. We need to get you in with an orthopadist. I am placing a local referral.

## 2024-12-27 NOTE — Progress Notes (Signed)
 "  Acute Office Visit  Patient ID: Glenda Fernandez, female    DOB: 26-Sep-1961, 64 y.o.   MRN: [id number]  PCP: Alvan Glenda BIRCH, MD  Chief Complaint  Patient presents with   rupture of breast implant    Subjective:     HPI  Discussed the use of AI scribe software for clinical note transcription with the patient, who gave verbal consent to proceed.  History of Present Illness Glenda Fernandez is a 64 year old female who presents with an open wound on her breast.  Breast wound, Left breast  - Open wound on breast present for approximately three to four weeks, beginning before the holiday season - Wound has progressively worsened over time - Pain occurs when water contacts the wound during showering - Managed with Band-Aid and gentle washing - History of previous breast issue noted as ruptured last year, but current wound is a new development  Musculoskeletal pain following fall - Fall occurred on Christmas Day in the same area of her house as a previous fall - Pain localized to wrist, with initial significant bruising and pain described as all over the wrist - Wrist pain managed with continuous use of a brace - No medical evaluation sought for wrist injury - Pain also present in knee and leg following the fall  Pain management - Tramadol  taken for pain but found to be ineffective - Prefers alternating ibuprofen  and Tylenol  for pain relief - Takes two Tylenol  and three ibuprofen  at night to alleviate pain in leg, arm, and breast   ROS     Objective:    BP (!) 119/54   Pulse 85   SpO2 98%    Physical Exam Musculoskeletal:     Comments: Left wrist is swollen posteriorly.  Normal ROM of thumb and fingers         No results found for any visits on 12/27/24.     Assessment & Plan:   Problem List Items Addressed This Visit   None Visit Diagnoses       Left wrist pain    -  Primary   Relevant Medications   HYDROcodone -acetaminophen  (NORCO/VICODIN) 5-325 MG  tablet   Other Relevant Orders   DG Wrist Complete Left (Completed)     Fall on same level from slipping, tripping or stumbling, initial encounter       Relevant Orders   DG Wrist Complete Left (Completed)     Breast wound, left, initial encounter       Relevant Orders   Ambulatory referral to General Surgery       Assessment and Plan Assessment & Plan Open wound of breast with local infection Open wound on breast with suspected local infection. - Obtained wound culture to identify causative organism. - Initiated antibiotic therapy pending culture results. - Advised follow-up with breast surgeon for further evaluation and management.  Left wrist pain, rule out fracture Left wrist pain post-fall with possible fracture. - Ordered x-ray of left wrist to rule out fracture. - Advised use of wrist brace for support. - Instructed to follow up if no contact by end of day tomorrow for x-ray results. - short supply of pain meds given     Meds ordered this encounter  Medications   doxycycline  (VIBRA -TABS) 100 MG tablet    Sig: Take 1 tablet (100 mg total) by mouth 2 (two) times daily.    Dispense:  14 tablet    Refill:  0   HYDROcodone -acetaminophen  (NORCO/VICODIN) 5-325  MG tablet    Sig: Take 1 tablet by mouth every 6 (six) hours as needed for moderate pain (pain score 4-6).    Dispense:  20 tablet    Refill:  0    No follow-ups on file.  Glenda Byars, MD [facility] at [facility]   "

## 2024-12-28 NOTE — Telephone Encounter (Signed)
 Referral, clinical notes, Imaging results, demographics and copies of insurance cards have been faxed to [facility] at [phone]. Office will contact patient to schedule referral appointment.     Contacted [facility] ( spoke with Benton) to confirm Fax number [phone]. Per [place], office has openings this week for first available appointments.

## 2024-12-31 ENCOUNTER — Other Ambulatory Visit: Payer: Self-pay | Admitting: Family Medicine

## 2024-12-31 DIAGNOSIS — K2100 Gastro-esophageal reflux disease with esophagitis, without bleeding: Secondary | ICD-10-CM

## 2024-12-31 LAB — ANAEROBIC AND AEROBIC CULTURE

## 2024-12-31 NOTE — Progress Notes (Signed)
 Please call patient and let her know that we did get one of the culture reports back.  Growing out a bacteria called Pseudomonas.  Unfortunately the only medications that we will treated are IV medications so I need to get her in with infectious disease.  I am going to go ahead and place a referral I am going to see if we can get her here more locally or in [employer]. Also please see if she has been called and contacted by the general surgeon we need to get her in as soon as possible.  Orders Placed This Encounter  Procedures   Ambulatory referral to Orthopedic Surgery    Referral Priority:   Routine    Referral Type:   Surgical    Referral Reason:   Specialty Services Required    Requested Specialty:   Orthopedic Surgery    Number of Visits Requested:   1   Ambulatory referral to Infectious Disease    Referral Priority:   Routine    Referral Type:   Consultation    Referral Reason:   Specialty Services Required    Requested Specialty:   Infectious Diseases    Number of Visits Requested:   1

## 2024-12-31 NOTE — Addendum Note (Signed)
 Addended by: Tyran Huser D on: 12/31/2024 08:54 AM   Modules accepted: Orders

## 2024-12-31 NOTE — Progress Notes (Signed)
 I had spoken to patient and informed of results. She states she has not yet heard from General surgery referral Please see message as below.

## 2025-01-05 ENCOUNTER — Encounter: Payer: Self-pay | Admitting: Family Medicine

## 2025-01-05 ENCOUNTER — Other Ambulatory Visit: Payer: Self-pay | Admitting: Family Medicine

## 2025-01-05 DIAGNOSIS — S21002A Unspecified open wound of left breast, initial encounter: Secondary | ICD-10-CM

## 2025-01-05 NOTE — Progress Notes (Signed)
 [facility] ref declined ,recommend ref to plastics.

## 2025-01-10 NOTE — Telephone Encounter (Signed)
 Plastic ref was placed 1/21

## 2025-01-12 ENCOUNTER — Encounter: Payer: Self-pay | Admitting: Family Medicine

## 2025-01-12 DIAGNOSIS — S21002A Unspecified open wound of left breast, initial encounter: Secondary | ICD-10-CM

## 2025-01-13 ENCOUNTER — Ambulatory Visit

## 2025-01-13 NOTE — Telephone Encounter (Signed)
 Left message for patient to go to the ED ASAP.

## 2025-01-13 NOTE — Telephone Encounter (Signed)
 Go to ER.  I will also place a new refer to plastic at [facility] and see if we cna get the one with [facility] moved up.      Ref Team: can you see if can up appt with plastics with [facility], it can be any of the providers there. Doesn't have to be Dr. Lowery.

## 2025-01-14 ENCOUNTER — Encounter: Payer: Self-pay | Admitting: Plastic Surgery

## 2025-01-14 ENCOUNTER — Ambulatory Visit: Admitting: Plastic Surgery

## 2025-01-14 VITALS — BP 130/59 | HR 85 | Ht 64.5 in | Wt 136.0 lb

## 2025-01-14 DIAGNOSIS — Z720 Tobacco use: Secondary | ICD-10-CM

## 2025-01-14 DIAGNOSIS — S21002A Unspecified open wound of left breast, initial encounter: Secondary | ICD-10-CM | POA: Diagnosis not present

## 2025-01-14 DIAGNOSIS — T8544XA Capsular contracture of breast implant, initial encounter: Secondary | ICD-10-CM

## 2025-01-14 DIAGNOSIS — S21002D Unspecified open wound of left breast, subsequent encounter: Secondary | ICD-10-CM

## 2025-01-14 NOTE — Progress Notes (Signed)
 [name] is a 64 year old female who was initially seen in our office in July 2023 regarding erythema of her left breast as well as an extracapsular rupture of a silicone implant placed in 1975 for breast asymmetry.  At that time it was recommended that she have the implant removed.  She was scheduled for surgery and canceled due to being scared.  She has been lost to the clinic until today when she returns with an open wound on the inner lower quadrant of the left breast.  She states that the breast developed a very small wound just before Thanksgiving and before she was able to obtain care she fell sustaining an injury to her left knee.  She subsequently fell and sustained a left wrist fracture.  Since the onset of the small wound she now has a wound approximately 3 x 3 cm and 2 to 3 mm deep.  A wound culture obtained by her primary care provider shows Pseudomonas which is a multidrug-resistant organism.  She presents for management of her left breast.  She states that prior to falling that she was ambulatory without shortness of breath or chest pain.  She denies any significant medical problems.  She has not had an MI ,stroke, and has no history of DVT.  She does smoke 1 pack/day and has for 40 years.  She has asthma probably related to her smoking.  She denies any allergies to medication.  On examination she is awake alert and oriented. She is not short of breath while holding a normal conversation. She does have splints on her left knee and left wrist.  The left breast is hard in mall shape and due to the capsule.  She has as noted above and open wound on the left breast with exudate at the base of the wound.  Assessment: Wound infection over ruptured left breast implant.  I have discussed the issue with her and her daughter.  The implant needs to be removed and the skin debrided.  While there is no evidence of ongoing cellulitis or infection of the implant at this time I believe that that should be  done somewhat urgently.  I will plan to schedule her for surgery next week.  The implant will be removed and not replaced.  I will need to excise most of the lower pole of the breast to remove the ulcerated wound.  She understands that she will have a drain postoperatively.  I will plan on keeping her overnight for IV antibiotics.  All questions were answered to her satisfaction.  Photographs were obtained today with her consent.  Will submit her for removal of the implant and excision of the ulcerated skin at her request.  I spent in excess of 30 minutes reviewing the patient's chart, examining the patient, discussing treatment options, coordinating care, and documenting.

## 2025-01-17 ENCOUNTER — Encounter: Admitting: Student

## 2025-01-17 ENCOUNTER — Encounter (HOSPITAL_COMMUNITY): Payer: Self-pay | Admitting: Plastic Surgery

## 2025-01-18 ENCOUNTER — Encounter (HOSPITAL_COMMUNITY): Payer: Self-pay | Admitting: Plastic Surgery

## 2025-01-18 ENCOUNTER — Other Ambulatory Visit: Payer: Self-pay

## 2025-01-18 NOTE — Progress Notes (Incomplete)
 ID consult

## 2025-01-18 NOTE — Progress Notes (Signed)
 PCP - Dr Dorothyann Byars Cardiologist - none  Chest x-ray - n/a EKG - DOS Stress Test - n/a ECHO - n/a Cardiac Cath - n/a  ICD Pacemaker/Loop - n/a  Sleep Study -  n/a  Diabetes - n/a  Aspirin & Blood Thinner Instructions:  n/a.  ERAS -   Anesthesia review: no  STOP now taking any Aspirin (unless otherwise instructed by your surgeon), Aleve, Naproxen, Ibuprofen , Motrin , Advil , Goody's, BC's, all herbal medications, fish oil, and all vitamins.   Coronavirus Screening Do you have any of the following symptoms:  Cough yes/no: No Fever (>100.17F)  yes/no: No Runny nose yes/no: No Sore throat yes/no: No Difficulty breathing/shortness of breath  yes/no: No  Have you traveled in the last 14 days and where? yes/no: No  Patient verbalized understanding of instructions that were given via phone.

## 2025-01-19 ENCOUNTER — Encounter (HOSPITAL_COMMUNITY): Payer: Self-pay | Admitting: Plastic Surgery

## 2025-01-19 ENCOUNTER — Encounter: Payer: Self-pay | Admitting: Plastic Surgery

## 2025-01-19 ENCOUNTER — Encounter (HOSPITAL_COMMUNITY): Admission: RE | Disposition: A | Payer: Self-pay | Source: Home / Self Care | Attending: Plastic Surgery

## 2025-01-19 ENCOUNTER — Ambulatory Visit: Admitting: Plastic Surgery

## 2025-01-19 ENCOUNTER — Encounter (HOSPITAL_COMMUNITY): Payer: Self-pay | Admitting: Anesthesiology

## 2025-01-19 ENCOUNTER — Observation Stay (HOSPITAL_COMMUNITY)
Admission: RE | Admit: 2025-01-19 | Discharge: 2025-01-20 | Disposition: A | Source: Home / Self Care | Attending: Plastic Surgery | Admitting: Plastic Surgery

## 2025-01-19 VITALS — BP 125/67 | HR 71 | Ht 64.0 in | Wt 135.0 lb

## 2025-01-19 DIAGNOSIS — S21002D Unspecified open wound of left breast, subsequent encounter: Secondary | ICD-10-CM

## 2025-01-19 DIAGNOSIS — T8544XA Capsular contracture of breast implant, initial encounter: Secondary | ICD-10-CM

## 2025-01-19 DIAGNOSIS — L089 Local infection of the skin and subcutaneous tissue, unspecified: Secondary | ICD-10-CM | POA: Diagnosis present

## 2025-01-19 DIAGNOSIS — Z9886 Personal history of breast implant removal: Principal | ICD-10-CM

## 2025-01-19 HISTORY — DX: Attention-deficit hyperactivity disorder, unspecified type: F90.9

## 2025-01-19 HISTORY — DX: Pneumonia, unspecified organism: J18.9

## 2025-01-19 HISTORY — DX: Essential (primary) hypertension: I10

## 2025-01-19 LAB — BASIC METABOLIC PANEL WITH GFR
Anion gap: 12 (ref 5–15)
BUN: 18 mg/dL (ref 8–23)
CO2: 25 mmol/L (ref 22–32)
Calcium: 9 mg/dL (ref 8.9–10.3)
Chloride: 100 mmol/L (ref 98–111)
Creatinine, Ser: 0.93 mg/dL (ref 0.44–1.00)
GFR, Estimated: >60 mL/min
Glucose, Bld: 83 mg/dL (ref 70–99)
Potassium: 3.8 mmol/L (ref 3.5–5.1)
Sodium: 137 mmol/L (ref 135–145)

## 2025-01-19 LAB — CBC
HCT: 33.6 % — ABNORMAL LOW (ref 36.0–46.0)
Hemoglobin: 11.4 g/dL — ABNORMAL LOW (ref 12.0–15.0)
MCH: 30.8 pg (ref 26.0–34.0)
MCHC: 33.9 g/dL (ref 30.0–36.0)
MCV: 90.8 fL (ref 80.0–100.0)
Platelets: 236 10*3/uL (ref 150–400)
RBC: 3.7 MIL/uL — ABNORMAL LOW (ref 3.87–5.11)
RDW: 13.5 % (ref 11.5–15.5)
WBC: 7.2 10*3/uL (ref 4.0–10.5)
nRBC: 0 % (ref 0.0–0.2)

## 2025-01-19 MED ORDER — ACETAMINOPHEN 500 MG PO TABS
1000.0000 mg | ORAL_TABLET | Freq: Once | ORAL | Status: DC
  Filled 2025-01-19: qty 2

## 2025-01-19 MED ORDER — PHENYLEPHRINE 80 MCG/ML (10ML) SYRINGE FOR IV PUSH (FOR BLOOD PRESSURE SUPPORT)
PREFILLED_SYRINGE | INTRAVENOUS | Status: AC
  Filled 2025-01-19: qty 40

## 2025-01-19 MED ORDER — HYDRALAZINE HCL 20 MG/ML IJ SOLN
INTRAMUSCULAR | Status: AC
  Filled 2025-01-19: qty 1

## 2025-01-19 MED ORDER — HYDROMORPHONE HCL 1 MG/ML IJ SOLN
INTRAMUSCULAR | Status: DC | PRN
  Administered 2025-01-19: .5 mg via INTRAVENOUS

## 2025-01-19 MED ORDER — AMISULPRIDE (ANTIEMETIC) 5 MG/2ML IV SOLN: 10.0000 mg | Freq: Once | INTRAVENOUS | Status: DC | PRN

## 2025-01-19 MED ORDER — LIDOCAINE 2% (20 MG/ML) 5 ML SYRINGE
INTRAMUSCULAR | Status: AC
  Filled 2025-01-19: qty 15

## 2025-01-19 MED ORDER — TRANEXAMIC ACID 1000 MG/10ML IV SOLN
3000.0000 mg | INTRAVENOUS | Status: AC
  Filled 2025-01-19: qty 30

## 2025-01-19 MED ORDER — ONDANSETRON 4 MG PO TBDP: 4.0000 mg | ORAL_TABLET | Freq: Once | ORAL | Status: AC

## 2025-01-19 MED ORDER — SODIUM CHLORIDE 0.9 % IV SOLN: INTRAVENOUS | Status: DC

## 2025-01-19 MED ORDER — TRANEXAMIC ACID 1000 MG/10ML IV SOLN
INTRAVENOUS | Status: DC | PRN
  Administered 2025-01-19: 3000 mg via TOPICAL

## 2025-01-19 MED ORDER — LIDOCAINE-EPINEPHRINE 1 %-1:100000 IJ SOLN
INTRAMUSCULAR | Status: AC
  Filled 2025-01-19: qty 2

## 2025-01-19 MED ORDER — ROCURONIUM BROMIDE 10 MG/ML (PF) SYRINGE
PREFILLED_SYRINGE | INTRAVENOUS | Status: AC
  Filled 2025-01-19: qty 20

## 2025-01-19 MED ORDER — BUPIVACAINE-EPINEPHRINE 0.25% -1:200000 IJ SOLN
INTRAMUSCULAR | Status: DC | PRN
  Administered 2025-01-19: 30 mL

## 2025-01-19 MED ORDER — LIDOCAINE 2% (20 MG/ML) 5 ML SYRINGE
INTRAMUSCULAR | Status: DC | PRN
  Administered 2025-01-19: 100 mg via INTRAVENOUS

## 2025-01-19 MED ORDER — ACETAMINOPHEN 325 MG PO TABS: 650.0000 mg | ORAL_TABLET | ORAL | Status: DC | PRN

## 2025-01-19 MED ORDER — CHLORHEXIDINE GLUCONATE CLOTH 2 % EX PADS: 6.0000 | MEDICATED_PAD | Freq: Once | CUTANEOUS | Status: DC

## 2025-01-19 MED ORDER — HYDROMORPHONE HCL 1 MG/ML IJ SOLN
INTRAMUSCULAR | Status: AC
  Filled 2025-01-19: qty 0.5

## 2025-01-19 MED ORDER — EPHEDRINE 5 MG/ML INJ
INTRAVENOUS | Status: AC
  Filled 2025-01-19: qty 5

## 2025-01-19 MED ORDER — PROPOFOL 10 MG/ML IV BOLUS
INTRAVENOUS | Status: DC | PRN
  Administered 2025-01-19: 20 mg via INTRAVENOUS
  Administered 2025-01-19: 100 mg via INTRAVENOUS
  Administered 2025-01-19 (×2): 30 mg via INTRAVENOUS

## 2025-01-19 MED ORDER — OXYCODONE-ACETAMINOPHEN 5-325 MG PO TABS
1.0000 | ORAL_TABLET | Freq: Four times a day (QID) | ORAL | Status: DC | PRN
  Administered 2025-01-19: 2 via ORAL
  Administered 2025-01-20 (×2): 1 via ORAL
  Administered 2025-01-20: 2 via ORAL
  Filled 2025-01-19 (×2): qty 1
  Filled 2025-01-19 (×3): qty 2

## 2025-01-19 MED ORDER — ONDANSETRON HCL 4 MG/2ML IJ SOLN
INTRAMUSCULAR | Status: DC | PRN
  Administered 2025-01-19: 4 mg via INTRAVENOUS

## 2025-01-19 MED ORDER — OXYCODONE-ACETAMINOPHEN 10-325 MG PO TABS: 1.0000 | ORAL_TABLET | Freq: Three times a day (TID) | ORAL | 0 refills | Status: AC | PRN

## 2025-01-19 MED ORDER — ONDANSETRON 4 MG PO TBDP: 4.0000 mg | ORAL_TABLET | Freq: Three times a day (TID) | ORAL | Status: DC | PRN

## 2025-01-19 MED ORDER — DEXAMETHASONE SOD PHOSPHATE PF 10 MG/ML IJ SOLN
INTRAMUSCULAR | Status: DC | PRN
  Administered 2025-01-19: 10 mg via INTRAVENOUS

## 2025-01-19 MED ORDER — PHENYLEPHRINE HCL-NACL 20-0.9 MG/250ML-% IV SOLN
INTRAVENOUS | Status: DC | PRN
  Administered 2025-01-19: 25 ug/min via INTRAVENOUS

## 2025-01-19 MED ORDER — OXYCODONE HCL 5 MG PO TABS: 5.0000 mg | ORAL_TABLET | Freq: Once | ORAL | Status: DC | PRN

## 2025-01-19 MED ORDER — ROCURONIUM BROMIDE 10 MG/ML (PF) SYRINGE
PREFILLED_SYRINGE | INTRAVENOUS | Status: DC | PRN
  Administered 2025-01-19: 40 mg via INTRAVENOUS
  Administered 2025-01-19: 50 mg via INTRAVENOUS

## 2025-01-19 MED ORDER — METOPROLOL TARTRATE 5 MG/5ML IV SOLN
INTRAVENOUS | Status: AC
  Filled 2025-01-19: qty 5

## 2025-01-19 MED ORDER — FENTANYL CITRATE (PF) 100 MCG/2ML IJ SOLN
INTRAMUSCULAR | Status: AC
  Filled 2025-01-19: qty 2

## 2025-01-19 MED ORDER — VANCOMYCIN HCL IN DEXTROSE 1-5 GM/200ML-% IV SOLN
INTRAVENOUS | Status: AC
  Filled 2025-01-19: qty 200

## 2025-01-19 MED ORDER — 0.9 % SODIUM CHLORIDE (POUR BTL) OPTIME
TOPICAL | Status: DC | PRN
  Administered 2025-01-19 (×2): 1000 mL
  Administered 2025-01-19: 2000 mL

## 2025-01-19 MED ORDER — LACTATED RINGERS IV SOLN: INTRAVENOUS | Status: DC

## 2025-01-19 MED ORDER — ONDANSETRON HCL 4 MG/2ML IJ SOLN
INTRAMUSCULAR | Status: AC
  Filled 2025-01-19: qty 2

## 2025-01-19 MED ORDER — OXYCODONE HCL 5 MG/5ML PO SOLN: 5.0000 mg | Freq: Once | ORAL | Status: DC | PRN

## 2025-01-19 MED ORDER — CHLORHEXIDINE GLUCONATE 0.12 % MT SOLN
15.0000 mL | Freq: Once | OROMUCOSAL | Status: DC
  Filled 2025-01-19: qty 15

## 2025-01-19 MED ORDER — PIPERACILLIN-TAZOBACTAM 3.375 G IVPB
3.3750 g | Freq: Three times a day (TID) | INTRAVENOUS | Status: DC
  Administered 2025-01-19 – 2025-01-20 (×3): 3.375 g via INTRAVENOUS
  Filled 2025-01-19 (×3): qty 50

## 2025-01-19 MED ORDER — ORAL CARE MOUTH RINSE: 15.0000 mL | Freq: Once | OROMUCOSAL | Status: DC

## 2025-01-19 MED ORDER — PROPOFOL 500 MG/50ML IV EMUL
INTRAVENOUS | Status: DC | PRN
  Administered 2025-01-19: 25 ug/kg/min via INTRAVENOUS

## 2025-01-19 MED ORDER — MIDAZOLAM HCL 2 MG/2ML IJ SOLN
INTRAMUSCULAR | Status: AC
  Filled 2025-01-19: qty 2

## 2025-01-19 MED ORDER — VANCOMYCIN HCL 1000 MG IV SOLR
INTRAVENOUS | Status: DC | PRN
  Administered 2025-01-19: 1000 mg via INTRAVENOUS

## 2025-01-19 MED ORDER — EPHEDRINE SULFATE-NACL 50-0.9 MG/10ML-% IV SOSY
PREFILLED_SYRINGE | INTRAVENOUS | Status: DC | PRN
  Administered 2025-01-19: 10 mg via INTRAVENOUS

## 2025-01-19 MED ORDER — DEXAMETHASONE SOD PHOSPHATE PF 10 MG/ML IJ SOLN
INTRAMUSCULAR | Status: AC
  Filled 2025-01-19: qty 1

## 2025-01-19 MED ORDER — SUGAMMADEX SODIUM 200 MG/2ML IV SOLN
INTRAVENOUS | Status: DC | PRN
  Administered 2025-01-19: 200 mg via INTRAVENOUS

## 2025-01-19 MED ORDER — PHENYLEPHRINE 80 MCG/ML (10ML) SYRINGE FOR IV PUSH (FOR BLOOD PRESSURE SUPPORT)
PREFILLED_SYRINGE | INTRAVENOUS | Status: DC | PRN
  Administered 2025-01-19: 80 ug via INTRAVENOUS
  Administered 2025-01-19: 160 ug via INTRAVENOUS

## 2025-01-19 MED ORDER — FENTANYL CITRATE (PF) 100 MCG/2ML IJ SOLN: 25.0000 ug | INTRAMUSCULAR | Status: DC | PRN

## 2025-01-19 MED ORDER — FENTANYL CITRATE (PF) 250 MCG/5ML IJ SOLN
INTRAMUSCULAR | Status: DC | PRN
  Administered 2025-01-19 (×2): 50 ug via INTRAVENOUS

## 2025-01-19 MED ORDER — BACITRACIN ZINC 500 UNIT/GM EX OINT
TOPICAL_OINTMENT | CUTANEOUS | Status: AC
  Filled 2025-01-19: qty 28.35

## 2025-01-19 MED ORDER — MIDAZOLAM HCL (PF) 2 MG/2ML IJ SOLN
INTRAMUSCULAR | Status: DC | PRN
  Administered 2025-01-19: 2 mg via INTRAVENOUS

## 2025-01-19 MED ORDER — DEXMEDETOMIDINE HCL IN NACL 80 MCG/20ML IV SOLN
INTRAVENOUS | Status: AC
  Filled 2025-01-19: qty 40

## 2025-01-19 MED ORDER — BUPIVACAINE-EPINEPHRINE (PF) 0.25% -1:200000 IJ SOLN
INTRAMUSCULAR | Status: AC
  Filled 2025-01-19: qty 30

## 2025-01-19 NOTE — Progress Notes (Signed)
 "    Patient ID: Glenda Fernandez, female    DOB: Dec 03, 1961, 64 y.o.   MRN: [id number]  Chief Complaint  Patient presents with   Pre-op Exam    No diagnosis found.   History of Present Illness: Glenda Fernandez is a 65 y.o.  female  with a history of a left breast implant placed in 1975 for breast asymmetry.  Patient has the same implant placed at that time.  She has a known rupture of the implant and was scheduled to have the implant removed 3 years ago.  She deferred the surgery at that time.  She presented to my clinic approximately a week ago with an ulcerated lesion on the inferior aspect of the breast which has been increasing in size since November.  I recommended that she undergo urgent excision of the ulcer with removal of the implant and capsulectomy..  She presents for preoperative evaluation for upcoming procedure, for excision of the ulcer and removal of the implant and capsule, scheduled for  with Dr. Waddell.  The patient has not had problems with anesthesia.     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  She also uses albuterol  once every 1 to 2 weeks  Past Medical Problems: Past Medical History:  Diagnosis Date   ADHD (attention deficit hyperactivity disorder)    Arthritis    osteo   Asthma    Closed fracture of base of fifth metatarsal bone of right foot 06/01/2021   Closed fracture of neck of third metacarpal bone of left hand 03/06/2022   Depression    GERD (gastroesophageal reflux disease)    Hypertension    Iron deficiency anemia 09/11/2012   Pneumonia    x several   Tibial plateau fracture, left, closed, initial encounter 11/03/2018    Past Surgical History: Past Surgical History:  Procedure  Laterality Date   BACK SURGERY  2001   BLADDER SUSPENSION     BREAST ENHANCEMENT SURGERY Left    1975   CARPAL TUNNEL RELEASE Right 11/01/2016   Procedure: CARPAL TUNNEL RELEASE;  Surgeon: Norleen Gavel, MD;  Location: [facility];  Service: Orthopedics;  Laterality: Right;   HAND SURGERY Left 2013   for fracture, [place] [facility]   NECK SURGERY     ORIF WRIST FRACTURE Right 11/01/2016   Procedure: OPEN REDUCTION INTERNAL FIXATION (ORIF) WRIST FRACTURE;  Surgeon: Norleen Gavel, MD;  Location: [facility];  Service: Orthopedics;  Laterality: Right;   SHOULDER SURGERY Bilateral 2005    Social History: Social History   Socioeconomic History   Marital status: Married    Spouse name: Lamar   Number of children: 2   Years of education: Not on file   Highest education level: Some college, no degree  Occupational History   Occupation: Evidence Tech    Comment: [employer]  Tobacco Use   Smoking status: Every Day    Current packs/day: 1.00    Average packs/day: 1 pack/day for 40.0 years (40.0 ttl pk-yrs)    Types: Cigarettes   Smokeless tobacco: Never   Tobacco comments:    1 pack a day   Vaping Use   Vaping status: Never Used  Substance and Sexual Activity   Alcohol use: No   Drug use: Never   Sexual activity:  Not Currently    Birth control/protection: Post-menopausal  Other Topics Concern   Not on file  Social History Narrative   Some college.  3 caffeine per day.  Walks 3 times per week for exercise.    Social Drivers of Health   Tobacco Use: High Risk (01/19/2025)   Patient History    Smoking Tobacco Use: Every Day    Smokeless Tobacco Use: Never    Passive Exposure: Not on file  Financial Resource Strain: Medium Risk (01/04/2025)   Received from [facility]   Overall Financial Resource Strain (CARDIA)    How hard is it for you to pay for the very basics like food, housing, medical care, and heating?: Somewhat hard  Food Insecurity: Food  Insecurity Present (01/04/2025)   Received from [facility]   Epic    Within the past 12 months, you worried that your food would run out before you got the money to buy more.: Sometimes true    Within the past 12 months, the food you bought just didn't last and you didn't have money to get more.: Sometimes true  Transportation Needs: Unmet Transportation Needs (01/04/2025)   Received from [facility]    In the past 12 months, has lack of transportation kept you from medical appointments or from getting medications?: Yes    In the past 12 months, has lack of transportation kept you from meetings, work, or from getting things needed for daily living?: No  Physical Activity: Inactive (01/04/2025)   Received from [facility]   Exercise Vital Sign    On average, how many days per week do you engage in moderate to strenuous exercise (like a brisk walk)?: 0 days    Minutes of Exercise per Session: Not on file  Stress: Stress Concern Present (01/04/2025)   Received from [facility] of Occupational Health - Occupational Stress Questionnaire    Do you feel stress - tense, restless, nervous, or anxious, or unable to sleep at night because your mind is troubled all the time - these days?: To some extent  Social Connections: Patient Declined (01/04/2025)   Received from [facility]   Social Network    How would you rate your social network (family, work, friends)?: Patient declined  Intimate Partner Violence: Not At Risk (01/04/2025)   Received from [facility]   HITS    Over the last 12 months how often did your partner physically hurt you?: Never    Over the last 12 months how often did your partner insult you or talk down to you?: Never    Over the last 12 months how often did your partner threaten you with physical harm?: Never    Over the last 12 months how often did your partner scream or curse at you?: Never  Depression (PHQ2-9): High Risk (12/27/2024)    Depression (PHQ2-9)    PHQ-2 Score: 16  Alcohol Screen: Not on file  Housing: Low Risk (01/04/2025)   Received from [facility]    In the last 12 months, was there a time when you were not able to pay the mortgage or rent on time?: No    In the past 12 months, how many times have you moved where you were living?: 0    At any time in the past 12 months, were you homeless or living in a shelter (including now)?: No  Utilities: Not At Risk (01/04/2025)   Received from  [facility]   Epic    In the past 12 months has the electric, gas, oil, or water company threatened to shut off services in your home?: No  Health Literacy: Not on file    Family History: Family History  Problem Relation Age of Onset   Coronary artery disease Father    Lung cancer Father        mesothelioma   Hyperlipidemia Father    Alcohol abuse Other        uncle   Breast cancer Mother    Kidney failure Mother    Hyperlipidemia Mother    Bipolar disorder Daughter    Depression Daughter     Review of Systems: ROS  Physical Exam: Vital Signs BP 125/67 (BP Location: Right Arm, Patient Position: Sitting, Cuff Size: Normal)   Pulse 71   Ht 5' 4 (1.626 m)   Wt 135 lb (61.2 kg)   SpO2 96%   BMI 23.17 kg/m   Physical Exam  Constitutional:      General: Not in acute distress.    Appearance: Normal appearance. Not ill-appearing.  HENT:     Head: Normocephalic and atraumatic.  Eyes:     Pupils: Pupils are equal, round Neck:     Musculoskeletal: Normal range of motion.  Cardiovascular:     Rate and Rhythm: Normal rate    Pulses: Normal pulses.  Pulmonary:     Effort: Pulmonary effort is normal. No respiratory distress.  Abdominal:     General: Abdomen is flat. There is no distension.  Musculoskeletal: Normal range of motion.  Skin:    General: Skin is warm and dry.     Findings: No erythema or rash.  Neurological:     General: No focal deficit present.     Mental Status: Alert and  oriented to person, place, and time. Mental status is at baseline.     Motor: No weakness.  Psychiatric:        Mood and Affect: Mood normal.        Behavior: Behavior normal.   Left breast: Implant, capsular contracture, lower pole ulcer   Assessment/Plan: The patient is scheduled for excision of the lower pole ulcer, removal of the implant, capsulectomy as required with Dr. Waddell.  Risks, benefits, and alternatives of procedure discussed, questions answered and consent obtained.  Specifically I did discussed bleeding, infection which she is at a very high risk for due to the ulcer, damage to surrounding structures.  She will have a drain postoperatively.  Smoking Status: 1 pack/day for 40 years; Counseling Given?  Yes, she is trying to cut back Last Mammogram: September 2024 BI-RADS 2  Caprini Score: 4; Risk Factors include: No increased risk factors other than age, BMI less than 25, and length of planned surgery. Recommendation for mechanical  prophylaxis. Encourage early ambulation.   Pictures obtained: @consult  yes  Post-op Rx sent to pharmacy: Oxycodone , Zofran   Patient was provided with the  General Surgical Risk consent document and Pain Medication Agreement prior to their appointment.  They had adequate time to read through the risk consent documents and Pain Medication Agreement. We also discussed them in person together during this preop appointment. All of their questions were answered to their satisfaction.  Recommended calling if they have any further questions.  Risk consent form and Pain Medication Agreement to be scanned into patient's chart.  All questions answered to her satisfaction.  Will proceed with excision of the ulcer, removal of the implant, removal  of the capsule as indicated.  She understands she will have drains postoperatively.  She will spend the night tonight.   Electronically signed by: Leonce KATHEE Birmingham, MD 01/19/2025 8:37 AM      [1] No Known  Allergies [2]  Current Outpatient Medications:    albuterol  (VENTOLIN  HFA) 108 (90 Base) MCG/ACT inhaler, USE 2 PUFFS INTO LUNGS EVERY 6 HOURS AS NEEDED FOR WHEEZE OR FOR SHORTNESS OF BREATH, Disp: 8.5 each, Rfl: 3   FLUoxetine  (PROZAC ) 40 MG capsule, TAKE 2 CAPSULES BY MOUTH EVERY DAY, Disp: 180 capsule, Rfl: 1   [START ON 01/24/2025] lisdexamfetamine  (VYVANSE ) 70 MG capsule, Take 1 capsule (70 mg total) by mouth daily., Disp: 30 capsule, Rfl: 0   lisdexamfetamine  (VYVANSE ) 70 MG capsule, Take 1 capsule (70 mg total) by mouth daily., Disp: 30 capsule, Rfl: 0   lisdexamfetamine  (VYVANSE ) 70 MG capsule, Take 1 capsule (70 mg total) by mouth daily., Disp: 30 capsule, Rfl: 0   lisinopril -hydrochlorothiazide  (ZESTORETIC ) 20-25 MG tablet, Take 1 tablet by mouth daily. 30 day supply given. Must have labs done for additional refills., Disp: 30 tablet, Rfl: 0   omeprazole  (PRILOSEC) 40 MG capsule, TAKE 1 CAPSULE BY MOUTH EVERY DAY, Disp: 90 capsule, Rfl: 1   rOPINIRole  (REQUIP ) 0.5 MG tablet, TAKE 1 TABLET BY MOUTH EVERYDAY AT BEDTIME, Disp: 90 tablet, Rfl: 3   rosuvastatin  (CRESTOR ) 10 MG tablet, Take 1 tablet (10 mg total) by mouth at bedtime., Disp: 100 tablet, Rfl: 3   doxycycline  (VIBRA -TABS) 100 MG tablet, Take 1 tablet (100 mg total) by mouth 2 (two) times daily., Disp: 14 tablet, Rfl: 0   HYDROcodone -acetaminophen  (NORCO/VICODIN) 5-325 MG tablet, Take 1 tablet by mouth every 6 (six) hours as needed for moderate pain (pain score 4-6). (Patient not taking: Reported on 01/19/2025), Disp: 20 tablet, Rfl: 0  "

## 2025-01-19 NOTE — Interval H&P Note (Signed)
 History and Physical Interval Note: No change in exam or indication for surgery All questions answered Left breast marked. Will proceed with debridement and excision of wound, removal of left implant at her request  01/19/2025 4:13 PM  Glenda Fernandez  has presented today for surgery, with the diagnosis of s21.002d.  The various methods of treatment have been discussed with the patient and family. After consideration of risks, benefits and other options for treatment, the patient has consented to  Procedures with comments: REMOVAL, IMPLANT, BREAST (N/A) - removal of left breast implant, debridement of left breast ulcer IRRIGATION AND DEBRIDEMENT WOUND (N/A) as a surgical intervention.  The patient's history has been reviewed, patient examined, no change in status, stable for surgery.  I have reviewed the patient's chart and labs.  Questions were answered to the patient's satisfaction.     Leonce KATHEE Birmingham

## 2025-01-19 NOTE — Anesthesia Postprocedure Evaluation (Signed)
"   Anesthesia Post Note  Patient: Glenda Fernandez  Procedure(s) Performed: REMOVAL OF BREAST IMPLANT LEFT BREAST (Left: Breast) IRRIGATION AND DEBRIDEMENT OF WOUND LEFT BREAST (Breast)     Patient location during evaluation: PACU Anesthesia Type: General Level of consciousness: awake and alert Pain management: pain level controlled Vital Signs Assessment: post-procedure vital signs reviewed and stable Respiratory status: spontaneous breathing, nonlabored ventilation, respiratory function stable and patient connected to nasal cannula oxygen Cardiovascular status: blood pressure returned to baseline and stable Postop Assessment: no apparent nausea or vomiting Anesthetic complications: no   No notable events documented.  Last Vitals:  Vitals:   01/19/25 1945 01/19/25 2145  BP: (!) 126/57 (!) 115/58  Pulse: 70 88  Resp: 18 17  Temp: 36.5 C 36.9 C  SpO2: 95% 95%    Last Pain:  Vitals:   01/19/25 2130  TempSrc:   PainSc: 7                  Glenda Fernandez      "

## 2025-01-19 NOTE — Transfer of Care (Signed)
 Immediate Anesthesia Transfer of Care Note  Patient: Glenda Fernandez  Procedure(s) Performed: REMOVAL OF BREAST IMPLANT LEFT BREAST (Left: Breast) IRRIGATION AND DEBRIDEMENT OF WOUND LEFT BREAST (Breast)  Patient Location: PACU  Anesthesia Type:General  Level of Consciousness: awake and alert   Airway & Oxygen Therapy: Patient Spontanous Breathing and Patient connected to face mask oxygen  Post-op Assessment: Report given to RN and Post -op Vital signs reviewed and stable  Post vital signs: Reviewed and stable  Last Vitals:  Vitals Value Taken Time  BP 145/70 01/19/25 19:05  Temp 36.4 C 01/19/25 19:05  Pulse 77 01/19/25 19:13  Resp 26 01/19/25 19:13  SpO2 97 % 01/19/25 19:13  Vitals shown include unfiled device data.  Last Pain:  Vitals:   01/19/25 1905  TempSrc: Oral  PainSc:          Complications: No notable events documented.

## 2025-01-19 NOTE — Anesthesia Procedure Notes (Signed)
 Procedure Name: Intubation Date/Time: 01/19/2025 5:02 PM  Performed by: Mollie Olivia SAUNDERS, CRNAPre-anesthesia Checklist: Patient identified, Emergency Drugs available, Suction available and Patient being monitored Patient Re-evaluated:Patient Re-evaluated prior to induction Oxygen Delivery Method: Circle system utilized Preoxygenation: Pre-oxygenation with 100% oxygen Induction Type: IV induction Ventilation: Two handed mask ventilation required Laryngoscope Size: Mac and 3 Grade View: Grade I Tube type: Oral Tube size: 6.5 mm Number of attempts: 1 Airway Equipment and Method: Stylet Placement Confirmation: ETT inserted through vocal cords under direct vision, positive ETCO2 and breath sounds checked- equal and bilateral Secured at: 21 cm Tube secured with: Tape Dental Injury: Teeth and Oropharynx as per pre-operative assessment

## 2025-01-19 NOTE — Op Note (Signed)
 DATE OF OPERATION: 01/19/2025  LOCATION: [facility] Main operating Room  PREOPERATIVE DIAGNOSIS: Ruptured left breast implant with overlying necrotic, ulcerated wound  POSTOPERATIVE DIAGNOSIS: Same  PROCEDURE: Excision of wound, removal of breast implant, total capsulectomy  SURGEON: Marinell Birmingham, MD  ASSISTANT: Jayson Ness, third-year medical student  EBL: 25 cc  CONDITION: Stable  COMPLICATIONS: None  INDICATION: The patient, Glenda Fernandez, is a 64 y.o. female born on [birth year], is here for treatment of a ruptured left silicone breast implant with an infected overlying ulcerated lesion.SABRA   PROCEDURE DETAILS:  The patient was seen prior to surgery and marked.  The IV antibiotics were given. The patient was taken to the operating room and given a general anesthetic. A standard time out was performed and all information was confirmed by those in the room. SCDs were placed.   Patient was taken the operating room and the left breast and chest were prepped and draped in usual sterile manner.  An elliptical incision was made around the entire wound which measured approximately 5 x 5 cm in the full depth of the wound was excised.  The wound was divided in half and half of the tissue was sent to pathology for routine examination and half was sent to microbiology for culture and sensitivity.  The wound extended full depth through the breast to the implant.  After removing the ulcer the implant was removed.  The implant was ruptured and there was silicone spilled throughout the entire breast.  After removal of the implant the capsule which was extremely calcified was completely removed.  The capsule was also sent to pathology for routine examination.  The wound was irrigated multiple times with saline.  The wound was then irrigated with dilute Betadine.  Hemostasis was achieved with the electrocautery.  2 TXA soaked sponges were placed in the wound and left to dwell for 5 minutes.  After removing the sponges  the wound was again inspected for bleeding and once I was happy that the bleeding had been controlled a 19 French round drain was placed in the wound and brought out through a separate stab incision.  The deep tissues were then closed with 3-0 Monocryl.  The dermis was closed with interrupted and running 3-0 Monocryl and the skin was closed with a 4-0 Monocryl subcuticular stitch.  The incision was sealed with Dermabond and the patient was placed in a compressive garment.  She was awakened from anesthesia without incident transferred to the recovery room in good condition.  All instrument needle and sponge counts were reported as correct and there were no complications noted during the procedure. The patient was allowed to wake up and taken to recovery room in stable condition at the end of the case. The family was notified at the end of the case.   The advanced practice practitioner (APP) assisted throughout the case.  The APP was essential in retraction and counter traction when needed to make the case progress smoothly.  This retraction and assistance made it possible to see the tissue plans for the procedure.  The assistance was needed for blood control, tissue re-approximation and assisted with closure of the incision site.

## 2025-01-19 NOTE — Anesthesia Preprocedure Evaluation (Addendum)
"                                    Anesthesia Evaluation  Patient identified by MRN, date of birth, ID band Patient awake    Reviewed: Allergy & Precautions, NPO status , Patient's Chart, lab work & pertinent test results  Airway Mallampati: I  TM Distance: >3 FB Neck ROM: Full    Dental  (+) Edentulous Upper, Edentulous Lower, Dental Advisory Given   Pulmonary asthma , Current SmokerPatient did not abstain from smoking.   Pulmonary exam normal breath sounds clear to auscultation       Cardiovascular hypertension, Pt. on medications Normal cardiovascular exam Rhythm:Regular Rate:Normal     Neuro/Psych  PSYCHIATRIC DISORDERS  Depression    negative neurological ROS     GI/Hepatic Neg liver ROS,GERD  ,,  Endo/Other  negative endocrine ROS    Renal/GU negative Renal ROS  negative genitourinary   Musculoskeletal  (+) Arthritis ,    Abdominal   Peds  (+) ADHD Hematology negative hematology ROS (+)   Anesthesia Other Findings   Reproductive/Obstetrics                              Anesthesia Physical Anesthesia Plan  ASA: 2  Anesthesia Plan: General   Post-op Pain Management: Tylenol  PO (pre-op)*   Induction: Intravenous  PONV Risk Score and Plan: 2 and Ondansetron , Dexamethasone  and Midazolam   Airway Management Planned: Oral ETT  Additional Equipment:   Intra-op Plan:   Post-operative Plan: Extubation in OR  Informed Consent: I have reviewed the patients History and Physical, chart, labs and discussed the procedure including the risks, benefits and alternatives for the proposed anesthesia with the patient or authorized representative who has indicated his/her understanding and acceptance.     Dental advisory given  Plan Discussed with: CRNA  Anesthesia Plan Comments:          Anesthesia Quick Evaluation  "

## 2025-01-19 NOTE — H&P (View-Only) (Signed)
 "    Patient ID: Glenda Fernandez, female    DOB: Dec 03, 1961, 64 y.o.   MRN: [id number]  Chief Complaint  Patient presents with   Pre-op Exam    No diagnosis found.   History of Present Illness: Glenda Fernandez is a 65 y.o.  female  with a history of a left breast implant placed in 1975 for breast asymmetry.  Patient has the same implant placed at that time.  She has a known rupture of the implant and was scheduled to have the implant removed 3 years ago.  She deferred the surgery at that time.  She presented to my clinic approximately a week ago with an ulcerated lesion on the inferior aspect of the breast which has been increasing in size since November.  I recommended that she undergo urgent excision of the ulcer with removal of the implant and capsulectomy..  She presents for preoperative evaluation for upcoming procedure, for excision of the ulcer and removal of the implant and capsule, scheduled for  with Dr. Waddell.  The patient has not had problems with anesthesia.     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  She also uses albuterol  once every 1 to 2 weeks  Past Medical Problems: Past Medical History:  Diagnosis Date   ADHD (attention deficit hyperactivity disorder)    Arthritis    osteo   Asthma    Closed fracture of base of fifth metatarsal bone of right foot 06/01/2021   Closed fracture of neck of third metacarpal bone of left hand 03/06/2022   Depression    GERD (gastroesophageal reflux disease)    Hypertension    Iron deficiency anemia 09/11/2012   Pneumonia    x several   Tibial plateau fracture, left, closed, initial encounter 11/03/2018    Past Surgical History: Past Surgical History:  Procedure  Laterality Date   BACK SURGERY  2001   BLADDER SUSPENSION     BREAST ENHANCEMENT SURGERY Left    1975   CARPAL TUNNEL RELEASE Right 11/01/2016   Procedure: CARPAL TUNNEL RELEASE;  Surgeon: Norleen Gavel, MD;  Location: [facility];  Service: Orthopedics;  Laterality: Right;   HAND SURGERY Left 2013   for fracture, [facility] [facility]   NECK SURGERY     ORIF WRIST FRACTURE Right 11/01/2016   Procedure: OPEN REDUCTION INTERNAL FIXATION (ORIF) WRIST FRACTURE;  Surgeon: Norleen Gavel, MD;  Location: [facility];  Service: Orthopedics;  Laterality: Right;   SHOULDER SURGERY Bilateral 2005    Social History: Social History   Socioeconomic History   Marital status: Married    Spouse name: Lamar   Number of children: 2   Years of education: Not on file   Highest education level: Some college, no degree  Occupational History   Occupation: Evidence Tech    Comment: [employer]  Tobacco Use   Smoking status: Every Day    Current packs/day: 1.00    Average packs/day: 1 pack/day for 40.0 years (40.0 ttl pk-yrs)    Types: Cigarettes   Smokeless tobacco: Never   Tobacco comments:    1 pack a day   Vaping Use   Vaping status: Never Used  Substance and Sexual Activity   Alcohol use: No   Drug use: Never   Sexual activity:  Not Currently    Birth control/protection: Post-menopausal  Other Topics Concern   Not on file  Social History Narrative   Some college.  3 caffeine per day.  Walks 3 times per week for exercise.    Social Drivers of Health   Tobacco Use: High Risk (01/19/2025)   Patient History    Smoking Tobacco Use: Every Day    Smokeless Tobacco Use: Never    Passive Exposure: Not on file  Financial Resource Strain: Medium Risk (01/04/2025)   Received from [facility]   Overall Financial Resource Strain (CARDIA)    How hard is it for you to pay for the very basics like food, housing, medical care, and heating?: Somewhat hard  Food Insecurity: Food  Insecurity Present (01/04/2025)   Received from [facility]   Epic    Within the past 12 months, you worried that your food would run out before you got the money to buy more.: Sometimes true    Within the past 12 months, the food you bought just didn't last and you didn't have money to get more.: Sometimes true  Transportation Needs: Unmet Transportation Needs (01/04/2025)   Received from [facility]    In the past 12 months, has lack of transportation kept you from medical appointments or from getting medications?: Yes    In the past 12 months, has lack of transportation kept you from meetings, work, or from getting things needed for daily living?: No  Physical Activity: Inactive (01/04/2025)   Received from [facility]   Exercise Vital Sign    On average, how many days per week do you engage in moderate to strenuous exercise (like a brisk walk)?: 0 days    Minutes of Exercise per Session: Not on file  Stress: Stress Concern Present (01/04/2025)   Received from [facility] Occupational [facility] - Occupational Stress Questionnaire    Do you feel stress - tense, restless, nervous, or anxious, or unable to sleep at night because your mind is troubled all the time - these days?: To some extent  Social Connections: Patient Declined (01/04/2025)   Received from [facility]   Social Network    How would you rate your social network (family, work, friends)?: Patient declined  Intimate Partner Violence: Not At Risk (01/04/2025)   Received from [facility]   HITS    Over the last 12 months how often did your partner physically hurt you?: Never    Over the last 12 months how often did your partner insult you or talk down to you?: Never    Over the last 12 months how often did your partner threaten you with physical harm?: Never    Over the last 12 months how often did your partner scream or curse at you?: Never  Depression (PHQ2-9): High Risk (12/27/2024)    Depression (PHQ2-9)    PHQ-2 Score: 16  Alcohol Screen: Not on file  Housing: Low Risk (01/04/2025)   Received from [facility]    In the last 12 months, was there a time when you were not able to pay the mortgage or rent on time?: No    In the past 12 months, how many times have you moved where you were living?: 0    At any time in the past 12 months, were you homeless or living in a shelter (including now)?: No  Utilities: Not At Risk (01/04/2025)   Received from  [facility]   Epic    In the past 12 months has the electric, gas, oil, or water company threatened to shut off services in your home?: No  Health Literacy: Not on file    Family History: Family History  Problem Relation Age of Onset   Coronary artery disease Father    Lung cancer Father        mesothelioma   Hyperlipidemia Father    Alcohol abuse Other        uncle   Breast cancer Mother    Kidney failure Mother    Hyperlipidemia Mother    Bipolar disorder Daughter    Depression Daughter     Review of Systems: ROS  Physical Exam: Vital Signs BP 125/67 (BP Location: Right Arm, Patient Position: Sitting, Cuff Size: Normal)   Pulse 71   Ht 5' 4 (1.626 m)   Wt 135 lb (61.2 kg)   SpO2 96%   BMI 23.17 kg/m   Physical Exam  Constitutional:      General: Not in acute distress.    Appearance: Normal appearance. Not ill-appearing.  HENT:     Head: Normocephalic and atraumatic.  Eyes:     Pupils: Pupils are equal, round Neck:     Musculoskeletal: Normal range of motion.  Cardiovascular:     Rate and Rhythm: Normal rate    Pulses: Normal pulses.  Pulmonary:     Effort: Pulmonary effort is normal. No respiratory distress.  Abdominal:     General: Abdomen is flat. There is no distension.  Musculoskeletal: Normal range of motion.  Skin:    General: Skin is warm and dry.     Findings: No erythema or rash.  Neurological:     General: No focal deficit present.     Mental Status: Alert and  oriented to person, place, and time. Mental status is at baseline.     Motor: No weakness.  Psychiatric:        Mood and Affect: Mood normal.        Behavior: Behavior normal.   Left breast: Implant, capsular contracture, lower pole ulcer   Assessment/Plan: The patient is scheduled for excision of the lower pole ulcer, removal of the implant, capsulectomy as required with Dr. Waddell.  Risks, benefits, and alternatives of procedure discussed, questions answered and consent obtained.  Specifically I did discussed bleeding, infection which she is at a very high risk for due to the ulcer, damage to surrounding structures.  She will have a drain postoperatively.  Smoking Status: 1 pack/day for 40 years; Counseling Given?  Yes, she is trying to cut back Last Mammogram: September 2024 BI-RADS 2  Caprini Score: 4; Risk Factors include: No increased risk factors other than age, BMI less than 25, and length of planned surgery. Recommendation for mechanical  prophylaxis. Encourage early ambulation.   Pictures obtained: @consult  yes  Post-op Rx sent to pharmacy: Oxycodone , Zofran   Patient was provided with the  General Surgical Risk consent document and Pain Medication Agreement prior to their appointment.  They had adequate time to read through the risk consent documents and Pain Medication Agreement. We also discussed them in person together during this preop appointment. All of their questions were answered to their satisfaction.  Recommended calling if they have any further questions.  Risk consent form and Pain Medication Agreement to be scanned into patient's chart.  All questions answered to her satisfaction.  Will proceed with excision of the ulcer, removal of the implant, removal  of the capsule as indicated.  She understands she will have drains postoperatively.  She will spend the night tonight.   Electronically signed by: Leonce KATHEE Birmingham, MD 01/19/2025 8:37 AM      [1] No Known  Allergies [2]  Current Outpatient Medications:    albuterol  (VENTOLIN  HFA) 108 (90 Base) MCG/ACT inhaler, USE 2 PUFFS INTO LUNGS EVERY 6 HOURS AS NEEDED FOR WHEEZE OR FOR SHORTNESS OF BREATH, Disp: 8.5 each, Rfl: 3   FLUoxetine  (PROZAC ) 40 MG capsule, TAKE 2 CAPSULES BY MOUTH EVERY DAY, Disp: 180 capsule, Rfl: 1   [START ON 01/24/2025] lisdexamfetamine  (VYVANSE ) 70 MG capsule, Take 1 capsule (70 mg total) by mouth daily., Disp: 30 capsule, Rfl: 0   lisdexamfetamine  (VYVANSE ) 70 MG capsule, Take 1 capsule (70 mg total) by mouth daily., Disp: 30 capsule, Rfl: 0   lisdexamfetamine  (VYVANSE ) 70 MG capsule, Take 1 capsule (70 mg total) by mouth daily., Disp: 30 capsule, Rfl: 0   lisinopril -hydrochlorothiazide  (ZESTORETIC ) 20-25 MG tablet, Take 1 tablet by mouth daily. 30 day supply given. Must have labs done for additional refills., Disp: 30 tablet, Rfl: 0   omeprazole  (PRILOSEC) 40 MG capsule, TAKE 1 CAPSULE BY MOUTH EVERY DAY, Disp: 90 capsule, Rfl: 1   rOPINIRole  (REQUIP ) 0.5 MG tablet, TAKE 1 TABLET BY MOUTH EVERYDAY AT BEDTIME, Disp: 90 tablet, Rfl: 3   rosuvastatin  (CRESTOR ) 10 MG tablet, Take 1 tablet (10 mg total) by mouth at bedtime., Disp: 100 tablet, Rfl: 3   doxycycline  (VIBRA -TABS) 100 MG tablet, Take 1 tablet (100 mg total) by mouth 2 (two) times daily., Disp: 14 tablet, Rfl: 0   HYDROcodone -acetaminophen  (NORCO/VICODIN) 5-325 MG tablet, Take 1 tablet by mouth every 6 (six) hours as needed for moderate pain (pain score 4-6). (Patient not taking: Reported on 01/19/2025), Disp: 20 tablet, Rfl: 0  "

## 2025-01-20 ENCOUNTER — Encounter (HOSPITAL_COMMUNITY): Payer: Self-pay | Admitting: Plastic Surgery

## 2025-01-20 MED ORDER — CIPROFLOXACIN HCL 500 MG PO TABS: 500.0000 mg | ORAL_TABLET | Freq: Two times a day (BID) | ORAL | 0 refills | Status: AC

## 2025-01-20 NOTE — Progress Notes (Signed)
 Pt discharged by wheelchair to private vehicle with daughter. Pt took all belongings.

## 2025-01-20 NOTE — Plan of Care (Signed)

## 2025-01-20 NOTE — Progress Notes (Signed)
" °   01/20/25 0838  SDOH Interventions  Transportation Interventions Inpatient TOC   Resources provided  "

## 2025-01-20 NOTE — Discharge Summary (Signed)
 Physician Discharge Summary  Patient ID: Glenda Fernandez MRN: [id number] DOB/AGE: 07/23/1961 64 y.o.  Admit date: 01/19/2025 Discharge date: 01/20/2025  Admission Diagnoses:  Discharge Diagnoses:  Principal Problem:   S/p breast implant removal Active Problems:   Wound infection   Discharged Condition: good  Hospital Course: [name] was admitted for observation and antibiotics after removal of her left breast implant on February 4.  Since admission she has done very well.  She has been out of bed ambulating to the bathroom.  She has been tolerating a diet.  She has been afebrile her pain is well-controlled.  She is requesting discharge home  Consults: None  Significant Diagnostic Studies: microbiology: Tissue cultures were obtained yesterday in surgery.  Gram stain's were negative and cultures are still pending  Treatments: Removal of left breast implant with postoperative IV antibiotics  Discharge Exam: Blood pressure 125/66, pulse 80, temperature 98.5 F (36.9 C), temperature source Oral, resp. rate 16, height 5' 4 (1.626 m), weight 61.2 kg, SpO2 99%. Chest wall: no tenderness, tenderness on the left side over the surgical incision, no erythema, no swelling, no ecchymoses.  Drain is in place. Drain output has been minimal  Disposition: Will discharge home this evening.  She already has pain medication and antinausea medication.  Will place a prescription for Cipro  until the final culture results are available.  She has a follow-up with me arranged already in the office for February 12 at 9 AM.      [facility] [address] [place], [place] [address] [phone]  Signed: Leonce KATHEE Birmingham 01/20/2025, 6:38 PM

## 2025-01-20 NOTE — Discharge Instructions (Addendum)
 Wear compressive garment at all times unless washing garment or showering Record drain output May shower tomorrow morning Please walk is much as possible Please pick up antibiotics first thing tomorrow morning Please call the office for any questions or concerns, [phone]     SUNDAYS BREAKFAST TWO LOCATIONS: 8:00am served in [facility] by Awaken Ppl Corporation 8:30am SHUTTLE provided from [facility], served at Apache Corporation, [address] LUNCH TWO LOCATIONS [plus one additional third Sunday only] 10:30am - 12:30pm served at [employer]ab, Liberty Global, [place] [address] (1.2 miles from [facility]) 12:30pm served in [place]ivins by Land O'lakes Team (THIRD Sunday only) 1:30pm served at [facility] by [facility] one location [plus one additional third Sunday only] 5:00pm Every Sunday, served under the bridge at [address] by Dovie Under the 3m Company (.7 miles from [facility]) (THIRD Sunday ONLY) 4:00pm served in the parking garage, across from Nucor Corporation, corner of [place] and [place] by Ryland Group Works Ministries MONDAYS BREAKFAST 7:30am served in Nucor Corporation by the United States Steel Corporation and Friends LUNCH 10:30am - 12:30pm served at [employer], Liberty Global, [place] [address] (1.2 miles from [facility]) DINNER TWO LOCATIONS: 7:00pm served in front of the courthouse at the corner of Goldman Sachs and Sun Microsystems. by [place] Monday Night Meal (3 blocks from [facility]) 4:30pm served at the Autonation, [address] by Bank Of [place] Company (0.6 miles from [facility]) TUESDAYS BREAKFAST 8:00am - 9:00am served at The Tjx Companies, [address] (0.3 miles from [place]) LUNCH 10:30am - 12:30pm served at the [employer], Liberty Global [address], (1.2 miles from [place]) DINNER 6:00pm served at Csx Corporation, enter from Capital One and go to the Sonic Automotive, (0.7 miles from [place]) [facility] [place] BREAKFAST 7:00am - 8:00am served at [employer], Liberty Global [address], (1.2 miles from [place]) LUNCH ONE LOCATION [plus two additional locations listed below] 10:30am - 12:30pm served at [employer], Liberty Global [address], (1.2 miles from [place]) (FIRST Wednesday ONLY) 11:30am served at Dillard's, [place] [address] (6.6 miles from [place]) (SECOND Wednesday ONLY) 11:00am served at Meckling. Garnette Ava Blackwood of [address], [address] (1.3 miles from [place]) [place] TWO LOCATIONS 6:00pm served at W. R. Berkley, [place] W. Visteon Corporation. (1.3 miles from [facility]) 4:00pm - 6:00pm (hot dogs and chips) served at Levi Strauss of [facility], [address] (1.7 miles from [employer]) [place] BREAKFAST NOT AVAILABLE AT THIS TIME LUNCH 10:30am - 12:30pm served at [employer], Liberty Global, [place] [address] (1.2 miles from [place]) DINNER 6:00pm served at Csx Corporation, enter from Capital One and go to the Sonic Automotive, (0.7 miles from Nucor Corporation) [place] BREAKFAST NOT AVAILABLE AT THIS TIME LUNCH 10:30am - 12:30pm served at [employer], Liberty Global [address], (1.2 miles from [place]) DINNER TWO LOCATIONS, [plus one additional first Friday only] 6:00pm served under the bridge at [address] by Dovie Under Csx Corporation. (.7 miles from [facility]) 5:00pm - 7:00pm served at Levi Strauss of [employer], [place] [address] (1.7 miles from [facility]) (FIRST Friday ONLY) 5:45 pm - SHUTTLE provided from the LIBRARY at 5:45pm. Served at [facility], [address]. SATURDAYS BREAKFAST TWO LOCATIONS [plus one additional last Saturday only] 8:00am served at [facility] by Delphi 8:30am served at Pulte Homes, [address] (2.2 miles from [facility]) (LAST Saturday ONLY) 8:30am served at Beazer Homes, [address] (5 miles from [place]) LUNCH 10:30am - 12:30pm served at [employer], Liberty Global [address] (1.2 miles from [facility]) DINNER 6:00pm served under the bridge at [address] by World Fuel Services Corporation (0.7 miles from Nucor Corporation)  DIRECTIONS FROM [place] TO ALL MEAL LOCATIONS The Bridge at [address] (.7 miles from [address]) [address] on [place]. Turn Right onto Directv 433 ft. Continue onto [address] under bridge, about 500 ft. Courthouse (3 blocks from [facility]) South on [address]. Turn right on [address]  1 block to Ppl Corporation (.5 miles from [place]) [place] on [place]. Yrc Worldwide. past Brink's Company to Emcor. Enter from Capital One and go to the Affiliated Computer Services building W. R. Berkley [address] Visteon Corporation. (1.3 miles from [facility]) [address] on [place]. Turn Right onto W. Jama Cassis. church will be on the Left. The Tjx Companies [address] (.3 miles from [facility]) Go .3 miles on W. Friendly Destination is on your right Dillard's at Oneok (6.6 miles from [address]) [address] on [place] toward W Friendly Turn right onto W Friendly Continue onto Alcoa Inc. Continue onto Toll Brothers. 5. Tommi is on right SANMINA-SCI Futures Trader) [address] (.6 miles from [place]) [place] on [place]. [address] Turn Left onto [address] 0.3 miles Destination is on the Left. [place] Black & Decker at  American Express (5 miles from Nucor Corporation) 1. Head south on [address]. Turn right onto W Friendly Turn slightly left onto Quest Diagnostics Continue onto Quest Diagnostics Turn right at Barnes & Noble Continue to church on right [place] Sounds [facility] [address] (1.7 miles from [place]) [address] on [place] 1.4 miles [place] becomes [place]. [address] Continue 0.6 miles and church will be on theright. [place] Guardian Life Insurance at [address] (2.5 miles from Nucor Corporation) [place] provided from Massachusetts Mutual Life Park] [place] on [place]. Elm toward Estée Lauder right onto Costco Wholesale left onto Emerson Electric Turn left onto Micron Technology [address] (2.2 miles from [address]) [address] on [place] 1.4 miles [place] becomes [place]. Elm [address] Turn right onto [address]  [address] and church will be on the Left. Potter's House/[place] At&t [address] (1.2 miles from [facility]) 1.Turn right onto [facility] 2.Turn left onto GEANNIE Beagle 3.Micheline MICAEL Jama 4.Destination is on your right [place]. Garnette Ava Tommi of [address] at Toysrus (1.3 miles from [facility]) [address] on [address] Turn left onto Genuine Parts right onto S. Maryellen Cassis. Continue onto Kb Home [place]. Turn left onto Smurfit-stone Container. Turn right onto Wellpoint.  [facility] assistance programs. If you are behind on your bills and expenses, and need some help to make it through a short term hardship or financial emergency, there are several organizations and charities in the [place] and [place] area that may be able to help. They range from the Pathmark Stores, Liberty Global, Landscape Architect of [place]  and the local community action agency, the Intel, Avnet. These groups may be able to provide you resources to help pay your utility bills, rent, and they even offer housing  assistance.  Crisis assistance program Find help for paying your rent, electric bills, free food, and even funds to pay your mortgage. The Liberty Global [phone]) offers several services to local families, as funding allows. The Emergency Assistance Program (EAP), which they administer, provides household goods, free food, clothing, and financial aid to people in need in the [place]  area. The EAP program does have some qualification, and counselors will interview clients for financial assistance by written referral only. Referrals need to be made by the Department of Social Services or by other EAP approved human services agencies or charities in the area.  Money for resources for emergency assistance are available for security deposits for rent, water, electric, and gas, past due rent, utility bills, past due mortgage payments, food, and clothing. The Liberty Global also operates a programme researcher, broadcasting/film/video on the site. More Liberty Global.  Open Door Ministries of [employer]palmol[employer], which can be reached at [phone], offers emergency assistance programs for those in need of help, such as food, rent assistance, a soup kitchen, shelter, and clothing. They are based in [facility] [place]  but provide a number of services to those that qualify for assistance. Continue with Open Door Ministries programs.  [facility] Department of Social Services may be able to offer temporary financial assistance and cash grants for paying rent and utilities. Help may be provided for local county residents who may be experiencing personal crisis when other resources, including government programs, are not available. Call [phone]  St. Jerrell everitt Mt Society, which is based in [place], provides financial assistance of up to $50.00 to help pay for rent, utilities, cooling bills, rent, and prescription medications. The program also provides secondhand furniture to  those in need. [phone]  Mattel is a Geneticist, Molecular. The organization can offer emergency assistance for paying rent, electric bills, utilities, food, household products and furniture. They offer extensive emergency and transitional housing for families, children and single women, and also run a Boy's and Dole Food. 301 Thrift Shops, Cms Energy Corporation, and other aid offered too. [address] [place], [place]  [address], [phone]  Additional locations of the Pathmark Stores are in [place] and other nearby communities. When you have an emergency, need free food, money for basic needs, or just need assistance around Christmas, then the Pathmark Stores may have the resources you need. Or they can refer you to nearby agencies. Learn more.  [place] Low Income Risk Manager - This is offered for [facility] families. The federal government created Cit Group Program provides a one-time cash grant payment to help eligible low-income families pay their electric and heating bills. [address] [place], [place]  [address], [phone]  Government and Motorola - The county administers several emergency and self-sufficiency programs. Residents of [place] can get help with energy bills and food, rent,  and other expenses. In addition, work with a sports coach who may be able to help you find a job or improve your employment skills. More [place] public assistance.  [place] Emergency Assistance - A program offers emergency utility and rent funds for greater [place] area residents. The program can also provide counseling and referrals to charities and government programs. Also provides food and a free meal program that serves lunch Mondays - Saturdays and dinner seven days per week to individuals in the community. [address] [place], [place]  [address], [phone]  P[employer]in - Offers affordable apartment and housing communities across [place] and [place]. The low income and seniors can access public housing, rental assistance to qualified applicants, and apply for the section 8 rent subsidy program. Other programs include Chiropractor and Engineer, Maintenance. [address] [place], [place]  [address], dial [phone].  Basic needs such as clothing - Low income families can receive free items (school supplies, clothes, holiday assistance, etc.) from clothing closets while more moderate income [address] families can shop at caremark rx. Locations across the area help the needy. Get information on [place] Tri[place] free clothing centers.  The [facility] provides transitional housing to veterans and the disabled. Clients will also access other services too, including life skills classes, case management, and assistance in finding permanent housing. [address], [place], [place]  [address], call [phone]  [place] Transitional Housing in [place] is for people who were just evicted or that are formerly homeless. The non-profit will also help then gain self-sufficiency, find a home or apartment to live in, and also provides information on rent assistance when needed. Dial [phone]  [employer] Partnership to End Homelessness is available in [place]. Families that were evicted or that are homeless can gain shelter, food, clothing, furniture, and also emergency financial assistance. Other services include financial skills and life skills coaching, job training, and case management. [address], [place], [place] [address]. Telephone [phone].  The Dynegy, Avnet. runs the Ford Motor Company. This can help people save money on their heating and summer cooling bills, and is free to low income  families. Free upgrades can be made to your home. Phone [phone]  Many of the non-profits and programs mentioned above are all inclusive, meaning they can meet many needs of the low income, such as energy bills, food, rent, and more. However there are several organizations that focus just on rent and housing. Read more on rent assistance in [place] region.  Legal assistance for evictions, foreclosure, and more If you need free legal advice on civili issues, such as foreclosures, evictions, electronics engineer, government programs, domestic issues and more, Armed Forces Operational Officer Aid of [place]  [facility]) is a associate professor firm that provides free legal services and counsel to lower income people, seniors, disabled, and others. The goal is to ensure everyone has access to justice and fair representation.  Call them at [phone], or click here to learn more about [place]  free legal assistance programs.  [place] Avnet and funds for emergency expenses The Pathmark Stores is another organization that can provide people with deere & company and funds to pay bills. Their assistance depends on funding, and the demand for help is always very high. They can provide cash to help pay rent, a missed mortgage payment, or gas, electric, and water bills. But the assistance doesn't stop there. They also have a food  pantry on site, which can provide food once every three (3) months to people who need help. The Keycorp can also offer a engineering geologist once every three (3) months for a maximum three (3) times. After receiving this voucher over that period of time, applicants can receive this aid one every six (6) months after that. [phone].  Kohl's action agency The Intel, Avnet. offers job and dispensing optician. Resources are focused on helping students obtain the skills and experiences that are necessary to compete in today's challenging and  tight job market. The non-profit faith-based community action agency offers internship trainings as well as classroom instruction. Economically disadvantaged and challenged individuals and potential employers can use their services. Classes are tailored to meet the needs of people in the [place] region. [place], [place] [address], [phone]    Foreclosure prevention services Housing Counseling and Education is also offered by [employer] of the [place]. The agency (phone number is below) is a Engineer, Structural providing foreclosure advice and counseling. They offer mortgage resolution counseling and also reverse mortgage counseling. Counselors can direct people to both kimberly-clark, as well as [place]  foreclosure assistance options.  Warehouse Manager has locations in [place] and [employer]. They run debt and foreclosure prevention programs for local families. A sampling of the programs offered include both Budget and Housing Counseling. This includes money management, financial advice, budget review and development of a written action plan with a pensions consultant to help solve specific individual financial problems. In addition, housing and mortgage counselors can also provide pre- and post-purchase homeownership counseling, default resolution counseling (to prevent foreclosure) and reverse mortgage counseling. A Debt Management Program allows people and families with a high level of credit card or medical debt to consolidate and repay consumer debt and loans to creditors and rebuild positive credit ratings and scores. [phone]  Debt assistance programs Receive free counseling and debt help from [employer] of the [place]. The [facility] based agency can be reached at [phone]. The counselors provide free help, and the services include budget  counseling. This will help people manage their expenses and set goals. They also offer a Forensic Scientist, which will help individuals consolidate their debts and become debt free. Most of the workshops and services are free.  Community clinics in [place] of the leading health and dental centers are listed below. They may be able to provide medication, physicals, dental care, and general family care to residents of all incomes and backgrounds across the region. Some of the programs focus on the low income and underinsured. However if these clinics can't meet your needs, find information and details on more clinics in [place]  .  Some of the options include [employer]ott of [employer]. This center provides free or low cost health care to low-income adults 18 - 64, who have no health insurance. Among other services offered include a pharmacy and eye clinic. Phone [phone]  [facility], which is located in [place], is a community clinic that provides primary medical and health care to uninsured and underinsured adults and families, as well as the low income, in the greater [place] area on a sliding-fee scale. Call [phone]  [place] Adult Dental Program - They run a dental assistance program that is organized by [facility] to provide dental services and aid  to Sempra energy. Services offered by the dental clinic are limited to extractions, pain management, and minor restorative care. [phone]  [place] [facility] has locations in [facility] and [place]. The community clinics provide complete pediatric care including primary health, mental health, social work, neurology, cardiology, asthma. Dial [phone].  In addition to those [place] and [employer] community clinics, find other free community clinics in [place]  and across the county.  Food pantry and assistance Some of the  local food pantries and distribution centers to call for free food and groceries include The Hive of [place] (phone [phone]), The [facility] (phone [phone]) and also Ppl Corporation. Dial [phone].  Several other food banks in the region provide clothing, free food and meals, access to soup kitchens and other help. Find the addresses and phone numbers of more food pantries in [place]. [email or web address]_assistance_[email or web address]

## 2025-01-21 LAB — AEROBIC/ANAEROBIC CULTURE W GRAM STAIN (SURGICAL/DEEP WOUND): Gram Stain: NONE SEEN

## 2025-01-25 ENCOUNTER — Institutional Professional Consult (permissible substitution): Admitting: Plastic Surgery

## 2025-01-26 ENCOUNTER — Encounter: Admitting: Plastic Surgery

## 2025-01-27 ENCOUNTER — Encounter: Admitting: Plastic Surgery

## 2025-02-09 ENCOUNTER — Encounter: Admitting: Plastic Surgery
